# Patient Record
Sex: Female | Born: 1950 | Race: Asian | Hispanic: No | Marital: Married | Smoking: Never smoker
Health system: Southern US, Community
[De-identification: ages and names within clinical notes are randomized; demographics above are authoritative.]

## PROBLEM LIST (undated history)

## (undated) DIAGNOSIS — E785 Hyperlipidemia, unspecified: Secondary | ICD-10-CM

## (undated) DIAGNOSIS — E079 Disorder of thyroid, unspecified: Secondary | ICD-10-CM

## (undated) DIAGNOSIS — I10 Essential (primary) hypertension: Secondary | ICD-10-CM

## (undated) HISTORY — DX: Essential (primary) hypertension: I10

## (undated) HISTORY — PX: THYROID SURGERY: SHX805

## (undated) HISTORY — DX: Hyperlipidemia, unspecified: E78.5

## (undated) HISTORY — DX: Disorder of thyroid, unspecified: E07.9

---

## 2014-06-16 ENCOUNTER — Ambulatory Visit (INDEPENDENT_AMBULATORY_CARE_PROVIDER_SITE_OTHER): Payer: BLUE CROSS/BLUE SHIELD | Admitting: Family Medicine

## 2014-06-16 VITALS — BP 150/98 | HR 86 | Temp 97.9°F | Resp 16 | Ht <= 58 in | Wt 131.0 lb

## 2014-06-16 DIAGNOSIS — R05 Cough: Secondary | ICD-10-CM | POA: Diagnosis not present

## 2014-06-16 DIAGNOSIS — I10 Essential (primary) hypertension: Secondary | ICD-10-CM | POA: Diagnosis not present

## 2014-06-16 DIAGNOSIS — J302 Other seasonal allergic rhinitis: Secondary | ICD-10-CM | POA: Diagnosis not present

## 2014-06-16 DIAGNOSIS — R059 Cough, unspecified: Secondary | ICD-10-CM

## 2014-06-16 MED ORDER — CETIRIZINE HCL 10 MG PO TABS: 10.0000 mg | ORAL_TABLET | Freq: Every day | ORAL | Status: DC

## 2014-06-16 MED ORDER — HYDROCODONE-HOMATROPINE 5-1.5 MG/5ML PO SYRP: 5.0000 mL | ORAL_SOLUTION | Freq: Every evening | ORAL | Status: DC | PRN

## 2014-06-16 MED ORDER — BENZONATATE 100 MG PO CAPS: 200.0000 mg | ORAL_CAPSULE | Freq: Two times a day (BID) | ORAL | Status: DC | PRN

## 2014-06-16 MED ORDER — FLUTICASONE PROPIONATE 50 MCG/ACT NA SUSP: 2.0000 | Freq: Every day | NASAL | Status: DC

## 2014-06-16 NOTE — Progress Notes (Signed)
Chief Complaint:  Chief Complaint  Patient presents with  . Cough    x 3 weeks     HPI: Diana Gilmore is a 64 y.o. female who is here for  three-week history of intermittent dry cough. She started having this in conjunction with rhinorrhea and also watery eyes. She has had this before. She has worsening cough with cold air. She denies wheezing. She has not had any fevers or chills. She has been on a ACE inhibitor for the past 4 years for high blood pressure and denies any history of dry cough that has lasted this long in the past. He has tried over-the-counter Robitussin without any relief. Has not tried any allergy medications. Associated with scratchy throat throat and postnasal drip. Denies any recent travels, hemoptysis, weight loss, night sweats, fevers or chills. There is no history of tuberculosis.  Past Medical History  Diagnosis Date  . Thyroid disease   . Hypertension    Past Surgical History  Procedure Laterality Date  . Thyroid surgery     History   Social History  . Marital Status: Married    Spouse Name: N/A  . Number of Children: N/A  . Years of Education: N/A   Social History Main Topics  . Smoking status: Never Smoker   . Smokeless tobacco: Not on file  . Alcohol Use: Not on file  . Drug Use: Not on file  . Sexual Activity: Not on file   Other Topics Concern  . None   Social History Narrative  . None   History reviewed. No pertinent family history. No Known Allergies Prior to Admission medications   Medication Sig Start Date End Date Taking? Authorizing Provider  benazepril (LOTENSIN) 5 MG tablet Take 5 mg by mouth daily.   Yes Historical Provider, MD  levothyroxine (SYNTHROID, LEVOTHROID) 112 MCG tablet Take 112 mcg by mouth daily before breakfast.   Yes Historical Provider, MD     ROS: The patient denies fevers, chills, night sweats, unintentional weight loss, chest pain, palpitations, wheezing, dyspnea on exertion, nausea, vomiting, abdominal  pain, dysuria, hematuria, melena, numbness, weakness, or tingling.   All other systems have been reviewed and were otherwise negative with the exception of those mentioned in the HPI and as above.    PHYSICAL EXAM: Filed Vitals:   06/16/14 2005  BP: 150/98  Pulse: 86  Temp: 97.9 F (36.6 C)  Resp: 16   Filed Vitals:   06/16/14 2005  Height:  (1.346 m)  Weight: 131 lb (59.421 kg)   Body mass index is 32.8 kg/(m^2).  General: Alert, no acute distress HEENT:  Normocephalic, atraumatic, oropharynx patent. EOMI, PERRLA.  Erythematous throat, no exudates, TM normal, neg sinus tenderness, + erythematous/boggy nasal mucosa,  + PND Cardiovascular:  Regular rate and rhythm, no rubs murmurs or gallops.  Radial pulse intact. No pedal edema.  Respiratory: Clear to auscultation bilaterally.  No wheezes, rales, or rhonchi.  No cyanosis, no use of accessory musculature GI: No organomegaly, abdomen is soft and non-tender, positive bowel sounds.  No masses. Skin: No rashes. Neurologic: Facial musculature symmetric. Psychiatric: Patient is appropriate throughout our interaction. Lymphatic: No cervical lymphadenopathy Musculoskeletal: Gait intact.   LABS: No results found for this or any previous visit.   EKG/XRAY:   Primary read interpreted by Dr. Conley Rolls at [facility].   ASSESSMENT/PLAN: Encounter Diagnoses  Name Primary?  . Seasonal allergies Yes  . Cough   . Essential hypertension    Pleasant  64 year old obese female with past medical history of hypertension, hypothyroidism presents for 3 week history of intermittent dry cough that is consistent with seasonal allergy symptoms. Prescribed Zyrtec, Flonase, Tessalon Perles, Hycodan syrup I advised her that the this might be just seasonal allergy symptoms or could be related to her ACE inhibitor. We will try treatment of treating her symptoms first if she continues to persistent cough and she will need a chest x-ray.  She declines a chest  x-ray today. Follow-up with her PCP  Gross sideeffects, risk and benefits, and alternatives of medications d/w patient. Patient is aware that all medications have potential sideeffects and we are unable to predict every sideeffect or drug-drug interaction that may occur.  Hamilton CapriLE, Lajarvis Italiano PHUONG, DO 06/16/2014 9:14 PM

## 2014-07-22 ENCOUNTER — Ambulatory Visit (INDEPENDENT_AMBULATORY_CARE_PROVIDER_SITE_OTHER): Payer: BLUE CROSS/BLUE SHIELD

## 2014-07-22 ENCOUNTER — Ambulatory Visit (INDEPENDENT_AMBULATORY_CARE_PROVIDER_SITE_OTHER): Payer: BLUE CROSS/BLUE SHIELD | Admitting: Family Medicine

## 2014-07-22 VITALS — BP 138/92 | HR 95 | Temp 98.3°F | Resp 18 | Ht 60.0 in | Wt 131.0 lb

## 2014-07-22 DIAGNOSIS — M25562 Pain in left knee: Secondary | ICD-10-CM

## 2014-07-22 DIAGNOSIS — M25462 Effusion, left knee: Secondary | ICD-10-CM | POA: Diagnosis not present

## 2014-07-22 MED ORDER — MELOXICAM 7.5 MG PO TABS: 7.5000 mg | ORAL_TABLET | Freq: Every day | ORAL | Status: DC

## 2014-07-22 NOTE — Progress Notes (Addendum)
Subjective:    Patient ID: Diana Gilmore, female    DOB: May 27, 1950, 64 y.o.   MRN: [id number] This chart was scribed for Meredith Staggers, MD by Chestine Spore, ED Scribe. The patient was seen in room 7 at 12:03 PM.   Chief Complaint  Patient presents with  . Knee Pain    left, 2 weeks    HPI  Diana Gilmore is a 64 y.o. female with a medical hx of HTN and thyroid dx who presents today with no PCP on file complaining of left knee pain onset 5-6 weeks. Pt reports that she twisted the left knee 5-6 weeks ago and she felt the pain once she did that. Pt reports that 3-4 weeks ago she stepped off a curb when her knee slipped and gave way. Pt reports that she was having pain in the knee before she twisted the knee. Pt reports that her knee pain has been worsening since the fall. pt reports the pain began 5-6 weeks ago with pain and swelling and then pain is worsening. Pt is able to walk but she is not able to run or stand for long periods of time. Pt has not tried any medications for the relief of her symptoms. Pt notes that when her knee swells she will have pain with her left hip. Pt reports that her knee is sore when she is on it for more than one to two hours.  She states that she is having associated symptoms of left knee swelling, left hip pain. She states that she has tried Ice with no relief for her symptoms. She denies any other symptoms.      There are no active problems to display for this patient.  Past Medical History  Diagnosis Date  . Thyroid disease   . Hypertension    Past Surgical History  Procedure Laterality Date  . Thyroid surgery     No Known Allergies Prior to Admission medications   Medication Sig Start Date End Date Taking? Authorizing Provider  benazepril (LOTENSIN) 5 MG tablet Take 5 mg by mouth daily.    Historical Provider, MD  cetirizine (ZYRTEC) 10 MG tablet Take 1 tablet (10 mg total) by mouth daily. 06/16/14   Thao P Le, DO  levothyroxine (SYNTHROID, LEVOTHROID) 112  MCG tablet Take 112 mcg by mouth daily before breakfast.    Historical Provider, MD   History   Social History  . Marital Status: Married    Spouse Name: N/A  . Number of Children: N/A  . Years of Education: N/A   Occupational History  . Not on file.   Social History Main Topics  . Smoking status: Never Smoker   . Smokeless tobacco: Not on file  . Alcohol Use: Not on file  . Drug Use: Not on file  . Sexual Activity: Not on file   Other Topics Concern  . Not on file   Social History Narrative        Review of Systems     Objective:   Physical Exam  Musculoskeletal:       Left hip: Normal. She exhibits normal range of motion and no tenderness.       Left knee: She exhibits effusion. She exhibits normal range of motion and no erythema. Tenderness found. Medial joint line tenderness noted. No lateral joint line tenderness noted.  Left knee: Skin intact. Full ROM of left knee. No erythema. 1-2 + effusion of the left knee. Lateral joint nontender. Fibula head non-tender.  Medial joint line tender including medial tibial plateau. Slight discomfort with valgus, but no laxity. No laxity noted with varus testing. Mcmurray Negative. Negative lachmans but slight discomfort with testing but negative for laxity. Left hip Full ROM with no tenderness.           Filed Vitals:   07/22/14 1137  BP: 138/92  Pulse: 95  Temp: 98.3 F (36.8 C)  Resp: 18  Height: 5' (1.524 m)  Weight: 131 lb (59.421 kg)  SpO2: 97%   [facility] reading (PRIMARY) by  Dr. Neva Seat: L  Knee: medial degenerative changes, no acute findings.    Assessment & Plan:   Diana Gilmore is a 64 y.o. female Left medial knee pain - Plan: DG Knee Complete 4 Views Left, meloxicam (MOBIC) 7.5 MG tablet  Knee swelling, left - Plan: DG Knee Complete 4 Views Left  Possible degenerative meniscal tear vs. OA flare.  -trial of hinged knee brace, meloxicam once per day (cardiac risks discussed, shortest effective dose), and HEP  if tolerates by AVS.   -recheck in 2 weeks - if not improving - consider corticosteroid injection or ortho eval depending on sx's or if persistent mechanical sx's.   -rtc sooner if worse.   Meds ordered this encounter  Medications  . meloxicam (MOBIC) 7.5 MG tablet    Sig: Take 1 tablet (7.5 mg total) by mouth daily.    Dispense:  30 tablet    Refill:  0   Patient Instructions  You may have arthritis flare in your knee or tear of the meniscus.  Can try the meloxicam once per day (do not combine with other NSAIDS like ibuprofen or alleve), hinged knee brace as needed for now. watch blood pressure and if it elevates on the meloxicam, stop that medicine and return to discuss other options.    If pain tolerates - can try some of the exercises below. If pain not improving in next 2 weeks - return to discuss next step.   Return to the clinic or go to the nearest emergency room if any of your symptoms worsen or new symptoms occur.  Meniscus Tear with Phase I Rehab The meniscus is a C-shaped cartilage structure, located in the knee joint between the thigh bone (femur) and the shinbone (tibia). Two menisci are located in each knee joint: the inner and outer meniscus. The meniscus acts as an adapter between the thigh bone and shinbone, allowing them to fit properly together. It also functions as a shock absorber, to reduce the stress placed on the knee joint and to help supply nutrients to the knee joint cartilage. As people age, the meniscus begins to harden and become more vulnerable to injury. Meniscus tears are a common injury, especially in older athletes. Inner meniscus tears are more common than outer meniscus tears.  SYMPTOMS   Pain in the knee, especially with standing or squatting with the affected leg.  Tenderness along the joint line.  Swelling in the knee joint (effusion), usually starting 1 to 2 days after injury.  Locking or catching of the knee joint, causing inability to straighten  the knee completely.  Giving way or buckling of the knee. CAUSES  A meniscus tear occurs when a force is placed on the meniscus that is greater than it can handle. Common causes of injury include:  Direct hit (trauma) to the knee.  Twisting, pivoting, or cutting (rapidly changing direction while running), kneeling or squatting.  Without injury, due to aging. RISK INCREASES WITH:  Contact sports (football, rugby).  Sports in which cleats are used with pivoting (soccer, lacrosse) or sports in which good shoe grip and sudden change in direction are required (racquetball, basketball, squash).  Previous knee injury.  Associated knee injury, particularly ligament injuries.  Poor strength and flexibility. PREVENTION  Warm up and stretch properly before activity.  Maintain physical fitness:  Strength, flexibility, and endurance.  Cardiovascular fitness.  Protect the knee with a brace or elastic bandage.  Wear properly fitted protective equipment (proper cleats for the surface). PROGNOSIS  Sometimes, meniscus tears heal on their own. However, definitive treatment requires surgery, followed by at least 6 weeks of recovery.  RELATED COMPLICATIONS   Recurring symptoms that result in a chronic problem.  Repeated knee injury, especially if sports are resumed too soon after injury or surgery.  Progression of the tear (the tear gets larger), if untreated.  Arthritis of the knee in later years (with or without surgery).  Complications of surgery, including infection, bleeding, injury to nerves (numbness, weakness, paralysis) continued pain, giving way, locking, nonhealing of meniscus (if repaired), need for further surgery, and knee stiffness (loss of motion). TREATMENT  Treatment first involves the use of ice and medicine, to reduce pain and inflammation. You may find using crutches to walk more comfortable. However, it is okay to bear weight on the injured knee, if the pain will  allow it. Surgery is often advised as a definitive treatment. Surgery is performed through an incision near the joint (arthroscopically). The torn piece of the meniscus is removed, and if possible the joint cartilage is repaired. After surgery, the joint must be restrained. After restraint, it is important to perform strengthening and stretching exercises to help regain strength and a full range of motion. These exercises may be completed at home or with a therapist.  MEDICATION  If pain medicine is needed, nonsteroidal anti-inflammatory medicines (aspirin and ibuprofen), or other minor pain relievers (acetaminophen), are often advised.  Do not take pain medicine for 7 days before surgery.  Prescription pain relievers may be given, if your caregiver thinks they are needed. Use only as directed and only as much as you need. HEAT AND COLD  Cold treatment (icing) should be applied for 10 to 15 minutes every 2 to 3 hours for inflammation and pain, and immediately after activity that aggravates your symptoms. Use ice packs or an ice massage.  Heat treatment may be used before performing stretching and strengthening activities prescribed by your caregiver, physical therapist, or athletic trainer. Use a heat pack or a warm water soak. SEEK MEDICAL CARE IF:   Symptoms get worse or do not improve in 2 weeks, despite treatment.  New, unexplained symptoms develop. (Drugs used in treatment may produce side effects.) EXERCISES RANGE OF MOTION (ROM) AND STRETCHING EXERCISES - Meniscus Tear, Non-operative, Phase I These are some of the initial exercises with which you may start your rehabilitation program, until you see your caregiver again or until your symptoms are resolved. Remember:   These initial exercises are intended to be gentle. They will help you restore motion without increasing any swelling.  Completing these exercises allows less painful movement and prepares you for the more aggressive  strengthening exercises in Phase II.  An effective stretch should be held for at least 30 seconds.  A stretch should never be painful. You should only feel a gentle lengthening or release in the stretched tissue. RANGE OF MOTION - Knee Flexion, Active  Lie on your back with both  knees straight. (If this causes back discomfort, bend your healthy knee, placing your foot flat on the floor.)  Slowly slide your heel back toward your buttocks until you feel a gentle stretch in the front of your knee or thigh.  Hold for __________ seconds. Slowly slide your heel back to the starting position. Repeat __________ times. Complete this exercise __________ times per day.  RANGE OF MOTION - Knee Flexion and Extension, Active-Assisted  Sit on the edge of a table or chair with your thighs firmly supported. It may be helpful to place a folded towel under the end of your right / left thigh.  Flexion (bending): Place the ankle of your healthy leg on top of the other ankle. Use your healthy leg to gently bend your right / left knee until you feel a mild tension across the top of your knee.  Hold for __________ seconds.  Extension (straightening): Switch your ankles so your right / left leg is on top. Use your healthy leg to straighten your right / left knee until you feel a mild tension on the backside of your knee.  Hold for __________ seconds. Repeat __________ times. Complete __________ times per day. STRETCH - Knee Flexion, Supine  Lie on the floor with your right / left heel and foot lightly touching the wall. (Place both feet on the wall if you do not use a door frame.)  Without using any effort, allow gravity to slide your foot down the wall slowly until you feel a gentle stretch in the front of your right / left knee.  Hold this stretch for __________ seconds. Then return the leg to the starting position, using your healthy leg for help, if needed. Repeat __________ times. Complete this stretch  __________ times per day.  STRETCH - Knee Extension Sitting  Sit with your right / left leg/heel propped on another chair, coffee table, or foot stool.  Allow your leg muscles to relax, letting gravity straighten out your knee.*  You should feel a stretch behind your right / left knee. Hold this position for __________ seconds. Repeat __________ times. Complete this stretch __________ times per day.  *Your physician, physical therapist or athletic trainer may instruct you place a __________ weight on your thigh, just above your kneecap, to deepen the stretch.  STRENGTHENING EXERCISES - Meniscus Tear, Non-operative, Phase I These exercises may help you when beginning to rehabilitate your injury. They may resolve your symptoms with or without further involvement from your physician, physical therapist or athletic trainer. While completing these exercises, remember:   Muscles can gain both the endurance and the strength needed for everyday activities through controlled exercises.  Complete these exercises as instructed by your physician, physical therapist or athletic trainer. Progress the resistance and repetitions only as guided. STRENGTH - Quadriceps, Isometrics  Lie on your back with your right / left leg extended and your opposite knee bent.  Gradually tense the muscles in the front of your right / left thigh. You should see either your knee cap slide up toward your hip or increased dimpling just above the knee. This motion will push the back of the knee down toward the floor, mat, or bed on which you are lying.  Hold the muscle as tight as you can, without increasing your pain, for __________ seconds.  Relax the muscles slowly and completely between each repetition. Repeat __________ times. Complete this exercise __________ times per day.  STRENGTH - Quadriceps, Short Arcs   Lie on your back.  Place a __________ inch towel roll under your right / left knee, so that the knee bends  slightly.  Raise only your lower leg by tightening the muscles in the front of your thigh. Do not allow your thigh to rise.  Hold this position for __________ seconds. Repeat __________ times. Complete this exercise __________ times per day.  OPTIONAL ANKLE WEIGHTS: Begin with ____________________, but DO NOT exceed ____________________. Increase in 1 pound/0.5 kilogram increments. STRENGTH - Quadriceps, Straight Leg Raises  Quality counts! Watch for signs that the quadriceps muscle is working, to be sure you are strengthening the correct muscles and not "cheating" by substituting with healthier muscles.  Lay on your back with your right / left leg extended and your opposite knee bent.  Tense the muscles in the front of your right / left thigh. You should see either your knee cap slide up or increased dimpling just above the knee. Your thigh may even shake a bit.  Tighten these muscles even more and raise your leg 4 to 6 inches off the floor. Hold for __________ seconds.  Keeping these muscles tense, lower your leg.  Relax the muscles slowly and completely in between each repetition. Repeat __________ times. Complete this exercise __________ times per day.  STRENGTH - Hamstring, Curls   Lay on your stomach with your legs extended. (If you lay on a bed, your feet may hang over the edge.)  Tighten the muscles in the back of your thigh to bend your right / left knee up to 90 degrees. Keep your hips flat on the bed.  Hold this position for __________ seconds.  Slowly lower your leg back to the starting position. Repeat __________ times. Complete this exercise __________ times per day.  STRENGTH - Quadriceps, Squats  Stand in a door frame so that your feet and knees are in line with the frame.  Use your hands for balance, not support, on the frame.  Slowly lower your weight, bending at the hips and knees. Keep your lower legs upright so that they are parallel with the door frame. Squat  only within the range that does not increase your knee pain. Never let your hips drop below your knees.  Slowly return upright, pushing with your legs, not pulling with your hands. Repeat __________ times. Complete this exercise __________ times per day.  STRENGTH - Quad/VMO, Isometric   Sit in a chair with your right / left knee slightly bent. With your fingertips, feel the VMO muscle just above the inside of your knee. The VMO is important in controlling the position of your kneecap.  Keeping your fingertips on this muscle. Without actually moving your leg, attempt to drive your knee down as if straightening your leg. You should feel your VMO tense. If you have a difficult time, you may wish to try the same exercise on your healthy knee first.  Tense this muscle as hard as you can without increasing any knee pain.  Hold for __________ seconds. Relax the muscles slowly and completely in between each repetition. Repeat __________ times. Complete exercise __________ times per day.  Document Released: 04/01/1998 Document Revised: 06/10/2011 Document Reviewed: 06/30/2008 [facility] Patient Information 2015 [employer], [place]. This information is not intended to replace advice given to you by your health care provider. Make sure you discuss any questions you have with your health care provider.      I personally performed the services described in this documentation, which was scribed in my presence. The recorded information has  been reviewed and considered, and addended by me as needed.

## 2014-07-22 NOTE — Patient Instructions (Signed)
You may have arthritis flare in your knee or tear of the meniscus.  Can try the meloxicam once per day (do not combine with other NSAIDS like ibuprofen or alleve), hinged knee brace as needed for now. watch blood pressure and if it elevates on the meloxicam, stop that medicine and return to discuss other options.    If pain tolerates - can try some of the exercises below. If pain not improving in next 2 weeks - return to discuss next step.   Return to the clinic or go to the nearest emergency room if any of your symptoms worsen or new symptoms occur.  Meniscus Tear with Phase I Rehab The meniscus is a C-shaped cartilage structure, located in the knee joint between the thigh bone (femur) and the shinbone (tibia). Two menisci are located in each knee joint: the inner and outer meniscus. The meniscus acts as an adapter between the thigh bone and shinbone, allowing them to fit properly together. It also functions as a shock absorber, to reduce the stress placed on the knee joint and to help supply nutrients to the knee joint cartilage. As people age, the meniscus begins to harden and become more vulnerable to injury. Meniscus tears are a common injury, especially in older athletes. Inner meniscus tears are more common than outer meniscus tears.  SYMPTOMS   Pain in the knee, especially with standing or squatting with the affected leg.  Tenderness along the joint line.  Swelling in the knee joint (effusion), usually starting 1 to 2 days after injury.  Locking or catching of the knee joint, causing inability to straighten the knee completely.  Giving way or buckling of the knee. CAUSES  A meniscus tear occurs when a force is placed on the meniscus that is greater than it can handle. Common causes of injury include:  Direct hit (trauma) to the knee.  Twisting, pivoting, or cutting (rapidly changing direction while running), kneeling or squatting.  Without injury, due to aging. RISK INCREASES  WITH:  Contact sports (football, rugby).  Sports in which cleats are used with pivoting (soccer, lacrosse) or sports in which good shoe grip and sudden change in direction are required (racquetball, basketball, squash).  Previous knee injury.  Associated knee injury, particularly ligament injuries.  Poor strength and flexibility. PREVENTION  Warm up and stretch properly before activity.  Maintain physical fitness:  Strength, flexibility, and endurance.  Cardiovascular fitness.  Protect the knee with a brace or elastic bandage.  Wear properly fitted protective equipment (proper cleats for the surface). PROGNOSIS  Sometimes, meniscus tears heal on their own. However, definitive treatment requires surgery, followed by at least 6 weeks of recovery.  RELATED COMPLICATIONS   Recurring symptoms that result in a chronic problem.  Repeated knee injury, especially if sports are resumed too soon after injury or surgery.  Progression of the tear (the tear gets larger), if untreated.  Arthritis of the knee in later years (with or without surgery).  Complications of surgery, including infection, bleeding, injury to nerves (numbness, weakness, paralysis) continued pain, giving way, locking, nonhealing of meniscus (if repaired), need for further surgery, and knee stiffness (loss of motion). TREATMENT  Treatment first involves the use of ice and medicine, to reduce pain and inflammation. You may find using crutches to walk more comfortable. However, it is okay to bear weight on the injured knee, if the pain will allow it. Surgery is often advised as a definitive treatment. Surgery is performed through an incision near the joint (  arthroscopically). The torn piece of the meniscus is removed, and if possible the joint cartilage is repaired. After surgery, the joint must be restrained. After restraint, it is important to perform strengthening and stretching exercises to help regain strength and a  full range of motion. These exercises may be completed at home or with a therapist.  MEDICATION  If pain medicine is needed, nonsteroidal anti-inflammatory medicines (aspirin and ibuprofen), or other minor pain relievers (acetaminophen), are often advised.  Do not take pain medicine for 7 days before surgery.  Prescription pain relievers may be given, if your caregiver thinks they are needed. Use only as directed and only as much as you need. HEAT AND COLD  Cold treatment (icing) should be applied for 10 to 15 minutes every 2 to 3 hours for inflammation and pain, and immediately after activity that aggravates your symptoms. Use ice packs or an ice massage.  Heat treatment may be used before performing stretching and strengthening activities prescribed by your caregiver, physical therapist, or athletic trainer. Use a heat pack or a warm water soak. SEEK MEDICAL CARE IF:   Symptoms get worse or do not improve in 2 weeks, despite treatment.  New, unexplained symptoms develop. (Drugs used in treatment may produce side effects.) EXERCISES RANGE OF MOTION (ROM) AND STRETCHING EXERCISES - Meniscus Tear, Non-operative, Phase I These are some of the initial exercises with which you may start your rehabilitation program, until you see your caregiver again or until your symptoms are resolved. Remember:   These initial exercises are intended to be gentle. They will help you restore motion without increasing any swelling.  Completing these exercises allows less painful movement and prepares you for the more aggressive strengthening exercises in Phase II.  An effective stretch should be held for at least 30 seconds.  A stretch should never be painful. You should only feel a gentle lengthening or release in the stretched tissue. RANGE OF MOTION - Knee Flexion, Active  Lie on your back with both knees straight. (If this causes back discomfort, bend your healthy knee, placing your foot flat on the  floor.)  Slowly slide your heel back toward your buttocks until you feel a gentle stretch in the front of your knee or thigh.  Hold for __________ seconds. Slowly slide your heel back to the starting position. Repeat __________ times. Complete this exercise __________ times per day.  RANGE OF MOTION - Knee Flexion and Extension, Active-Assisted  Sit on the edge of a table or chair with your thighs firmly supported. It may be helpful to place a folded towel under the end of your right / left thigh.  Flexion (bending): Place the ankle of your healthy leg on top of the other ankle. Use your healthy leg to gently bend your right / left knee until you feel a mild tension across the top of your knee.  Hold for __________ seconds.  Extension (straightening): Switch your ankles so your right / left leg is on top. Use your healthy leg to straighten your right / left knee until you feel a mild tension on the backside of your knee.  Hold for __________ seconds. Repeat __________ times. Complete __________ times per day. STRETCH - Knee Flexion, Supine  Lie on the floor with your right / left heel and foot lightly touching the wall. (Place both feet on the wall if you do not use a door frame.)  Without using any effort, allow gravity to slide your foot down the wall slowly  until you feel a gentle stretch in the front of your right / left knee.  Hold this stretch for __________ seconds. Then return the leg to the starting position, using your healthy leg for help, if needed. Repeat __________ times. Complete this stretch __________ times per day.  STRETCH - Knee Extension Sitting  Sit with your right / left leg/heel propped on another chair, coffee table, or foot stool.  Allow your leg muscles to relax, letting gravity straighten out your knee.*  You should feel a stretch behind your right / left knee. Hold this position for __________ seconds. Repeat __________ times. Complete this stretch  __________ times per day.  *Your physician, physical therapist or athletic trainer may instruct you place a __________ weight on your thigh, just above your kneecap, to deepen the stretch.  STRENGTHENING EXERCISES - Meniscus Tear, Non-operative, Phase I These exercises may help you when beginning to rehabilitate your injury. They may resolve your symptoms with or without further involvement from your physician, physical therapist or athletic trainer. While completing these exercises, remember:   Muscles can gain both the endurance and the strength needed for everyday activities through controlled exercises.  Complete these exercises as instructed by your physician, physical therapist or athletic trainer. Progress the resistance and repetitions only as guided. STRENGTH - Quadriceps, Isometrics  Lie on your back with your right / left leg extended and your opposite knee bent.  Gradually tense the muscles in the front of your right / left thigh. You should see either your knee cap slide up toward your hip or increased dimpling just above the knee. This motion will push the back of the knee down toward the floor, mat, or bed on which you are lying.  Hold the muscle as tight as you can, without increasing your pain, for __________ seconds.  Relax the muscles slowly and completely between each repetition. Repeat __________ times. Complete this exercise __________ times per day.  STRENGTH - Quadriceps, Short Arcs   Lie on your back. Place a __________ inch towel roll under your right / left knee, so that the knee bends slightly.  Raise only your lower leg by tightening the muscles in the front of your thigh. Do not allow your thigh to rise.  Hold this position for __________ seconds. Repeat __________ times. Complete this exercise __________ times per day.  OPTIONAL ANKLE WEIGHTS: Begin with ____________________, but DO NOT exceed ____________________. Increase in 1 pound/0.5 kilogram  increments. STRENGTH - Quadriceps, Straight Leg Raises  Quality counts! Watch for signs that the quadriceps muscle is working, to be sure you are strengthening the correct muscles and not "cheating" by substituting with healthier muscles.  Lay on your back with your right / left leg extended and your opposite knee bent.  Tense the muscles in the front of your right / left thigh. You should see either your knee cap slide up or increased dimpling just above the knee. Your thigh may even shake a bit.  Tighten these muscles even more and raise your leg 4 to 6 inches off the floor. Hold for __________ seconds.  Keeping these muscles tense, lower your leg.  Relax the muscles slowly and completely in between each repetition. Repeat __________ times. Complete this exercise __________ times per day.  STRENGTH - Hamstring, Curls   Lay on your stomach with your legs extended. (If you lay on a bed, your feet may hang over the edge.)  Tighten the muscles in the back of your thigh to bend  your right / left knee up to 90 degrees. Keep your hips flat on the bed.  Hold this position for __________ seconds.  Slowly lower your leg back to the starting position. Repeat __________ times. Complete this exercise __________ times per day.  STRENGTH - Quadriceps, Squats  Stand in a door frame so that your feet and knees are in line with the frame.  Use your hands for balance, not support, on the frame.  Slowly lower your weight, bending at the hips and knees. Keep your lower legs upright so that they are parallel with the door frame. Squat only within the range that does not increase your knee pain. Never let your hips drop below your knees.  Slowly return upright, pushing with your legs, not pulling with your hands. Repeat __________ times. Complete this exercise __________ times per day.  STRENGTH - Quad/VMO, Isometric   Sit in a chair with your right / left knee slightly bent. With your fingertips,  feel the VMO muscle just above the inside of your knee. The VMO is important in controlling the position of your kneecap.  Keeping your fingertips on this muscle. Without actually moving your leg, attempt to drive your knee down as if straightening your leg. You should feel your VMO tense. If you have a difficult time, you may wish to try the same exercise on your healthy knee first.  Tense this muscle as hard as you can without increasing any knee pain.  Hold for __________ seconds. Relax the muscles slowly and completely in between each repetition. Repeat __________ times. Complete exercise __________ times per day.  Document Released: 04/01/1998 Document Revised: 06/10/2011 Document Reviewed: 06/30/2008 [facility] Patient Information 2015 [place], [place]. This information is not intended to replace advice given to you by your health care provider. Make sure you discuss any questions you have with your health care provider.

## 2014-09-05 ENCOUNTER — Ambulatory Visit (INDEPENDENT_AMBULATORY_CARE_PROVIDER_SITE_OTHER): Payer: BLUE CROSS/BLUE SHIELD | Admitting: Family Medicine

## 2014-09-05 VITALS — BP 124/90 | HR 84 | Temp 98.5°F | Resp 18 | Ht 61.0 in | Wt 132.0 lb

## 2014-09-05 DIAGNOSIS — R059 Cough, unspecified: Secondary | ICD-10-CM

## 2014-09-05 DIAGNOSIS — E039 Hypothyroidism, unspecified: Secondary | ICD-10-CM

## 2014-09-05 DIAGNOSIS — R05 Cough: Secondary | ICD-10-CM

## 2014-09-05 DIAGNOSIS — I10 Essential (primary) hypertension: Secondary | ICD-10-CM

## 2014-09-05 LAB — COMPLETE METABOLIC PANEL WITH GFR
ALT: 19 U/L (ref 0–35)
AST: 17 U/L (ref 0–37)
Albumin: 4.7 g/dL (ref 3.5–5.2)
Alkaline Phosphatase: 56 U/L (ref 39–117)
BUN: 12 mg/dL (ref 6–23)
CO2: 27 mEq/L (ref 19–32)
CREATININE: 0.64 mg/dL (ref 0.50–1.10)
Calcium: 9.9 mg/dL (ref 8.4–10.5)
Chloride: 102 mEq/L (ref 96–112)
GFR, Est African American: >89 mL/min
GFR, Est Non African American: >89 mL/min
Glucose, Bld: 127 mg/dL — ABNORMAL HIGH (ref 70–99)
Potassium: 4.3 mEq/L (ref 3.5–5.3)
Sodium: 138 mEq/L (ref 135–145)
Total Bilirubin: 0.4 mg/dL (ref 0.2–1.2)
Total Protein: 7.9 g/dL (ref 6.0–8.3)

## 2014-09-05 LAB — LIPID PANEL
CHOL/HDL RATIO: 7 ratio
Cholesterol: 266 mg/dL — ABNORMAL HIGH (ref 0–200)
HDL: 38 mg/dL — AB (ref >=46)
Triglycerides: 426 mg/dL — ABNORMAL HIGH (ref <150)

## 2014-09-05 LAB — TSH: TSH: 2.301 u[IU]/mL (ref 0.350–4.500)

## 2014-09-05 MED ORDER — BENAZEPRIL HCL 5 MG PO TABS: 5.0000 mg | ORAL_TABLET | Freq: Every day | ORAL | Status: DC

## 2014-09-05 MED ORDER — LEVOTHYROXINE SODIUM 112 MCG PO TABS: 112.0000 ug | ORAL_TABLET | Freq: Every day | ORAL | Status: DC

## 2014-09-05 NOTE — Progress Notes (Signed)
Subjective:  This chart was scribed for Diana StaggersJeffrey Kiele Heavrin, MD by [facility], medical scribe [facility].The patient was seen in exam room 12 and the patient's care was started at 11:25 AM.   Patient ID: Diana Gilmore, female    DOB: 12/20/1950, 64 y.o.   MRN: [id number] Chief Complaint  Patient presents with  . Medication Refill    benazepril, levothyroxine   HPI HPI Comments: Diana Gilmore is a 64 y.o. female who presents to [facility] for a medication refill. She [place] not [place], [place] she [place] want her PCP here. Her last physical was one year ago. She had milk 4-5 hours ago other than that no other foods.  Hypertension: He takes Lotensin 5 mg daily. Last dose was last night. No side effects from her Lotensin.  No results found for: CREATININE  BP Readings from Last 3 Encounters:  09/05/14 124/90  07/22/14 138/92  06/16/14 150/98   Hypothyroidism: He takes synthroid 112 micrograms daily, no recent TSH on file. No missed doses. No recent hot/cold spells or weight changes.  Wt Readings from Last 3 Encounters:  09/05/14 132 lb (59.875 kg)  07/22/14 131 lb (59.421 kg)  06/16/14 131 lb (59.421 kg)   There are no active problems to display for this patient.  Past Medical History  Diagnosis Date  . Thyroid disease   . Hypertension    Past Surgical History  Procedure Laterality Date  . Thyroid surgery     No Known Allergies Prior to Admission medications   Medication Sig Start Date End Date Taking? Authorizing Provider  benazepril (LOTENSIN) 5 MG tablet Take 5 mg by mouth daily.   Yes Historical Provider, MD  levothyroxine (SYNTHROID, LEVOTHROID) 112 MCG tablet Take 112 mcg by mouth daily before breakfast.   Yes Historical Provider, MD  cetirizine (ZYRTEC) 10 MG tablet Take 1 tablet (10 mg total) by mouth daily. Patient not taking: Reported on 09/05/2014 06/16/14   Thao P Le, DO  meloxicam (MOBIC) 7.5 MG tablet Take 1  tablet (7.5 mg total) by mouth daily. Patient not taking: Reported on 09/05/2014 07/22/14   Shade FloodJeffrey R Beverely Suen, MD   History   Social History  . Marital Status: Married    Spouse Name: N/A  . Number of Children: N/A  . Years of Education: N/A   Occupational History  . Not on file.   Social History Main Topics  . Smoking status: Never Smoker   . Smokeless tobacco: Not on file  . Alcohol Use: Not on file  . Drug Use: Not on file  . Sexual Activity: Not on file   Other Topics Concern  . Not on file   Social History Narrative   Review of Systems  Constitutional: Negative for fatigue and unexpected weight change.  Respiratory: Negative for chest tightness and shortness of breath.   Cardiovascular: Negative for chest pain, palpitations and leg swelling.  Gastrointestinal: Negative for abdominal pain and blood in stool.  Neurological: Negative for dizziness, syncope, light-headedness and headaches.      Objective:  BP 124/90 mmHg  Pulse 84  Temp(Src) 98.5 F (36.9 C) (Oral)  Resp 18  Ht 5\' 1"  (1.549 m)  Wt 132 lb (59.875 kg)  BMI 24.95 kg/m2  SpO2 97% Physical Exam  Constitutional: She is oriented to person, place, and time. She appears well-developed and well-nourished. No distress.  HENT:  Head: Normocephalic and atraumatic.  Eyes: Conjunctivae  and EOM are normal. Pupils are equal, round, and reactive to light.  Neck: Normal range of motion. Carotid bruit is not present.  Cardiovascular: Normal rate, regular rhythm, normal heart sounds and intact distal pulses.   Pulmonary/Chest: Effort normal and breath sounds normal. No respiratory distress.  Abdominal: Soft. She exhibits no pulsatile midline mass. There is no tenderness.  Musculoskeletal: Normal range of motion.  Neurological: She is alert and oriented to person, place, and time.  Skin: Skin is warm and dry.  Psychiatric: She has a normal mood and affect. Her behavior is normal.  Nursing note and vitals reviewed.      Assessment & Plan:   Chantavia Bazzle is a 64 y.o. female Essential hypertension - Plan: COMPLETE METABOLIC PANEL WITH GFR, Lipid panel, benazepril (LOTENSIN) 5 MG tablet  - stable, check lipids, CMP, refilled benazepril at same dose for 6 months.   Hypothyroidism, unspecified hypothyroidism type - Plan: TSH, levothyroxine (SYNTHROID, LEVOTHROID) 112 MCG tablet  - tsh pending. Refilled synthroid at same dose for now.   Cough  -asx now.  Suspect allergies as episodic sx's, doubt ACE-I cough, but if recurrent- consider trial off ACE.   Plan on CPE with myself or other provider here in next 6 months as plans on [facility] as PCP now.   Meds ordered this encounter  Medications  . levothyroxine (SYNTHROID, LEVOTHROID) 112 MCG tablet    Sig: Take 1 tablet (112 mcg total) by mouth daily before breakfast.    Dispense:  90 tablet    Refill:  1  . benazepril (LOTENSIN) 5 MG tablet    Sig: Take 1 tablet (5 mg total) by mouth daily.    Dispense:  90 tablet    Refill:  1   Patient Instructions  You should receive a call or letter about your lab results within the next week to 10 days.  Schedule physical with myself or other provider accepting new patients in next 6 months.  If cough returns - return for recheck, but I think it is due to allergies, not your blood pressure medicine at this point.  Return to the clinic or go to the nearest emergency room if any of your symptoms worsen or new symptoms occur.      I personally performed the services described in this documentation, which was scribed in my presence. The recorded information has been reviewed and considered, and addended by me as needed.

## 2014-09-05 NOTE — Patient Instructions (Addendum)
You should receive a call or letter about your lab results within the next week to 10 days.  Schedule physical with myself or other provider accepting new patients in next 6 months.  If cough returns - return for recheck, but I think it is due to allergies, not your blood pressure medicine at this point.  Return to the clinic or go to the nearest emergency room if any of your symptoms worsen or new symptoms occur.

## 2014-09-19 ENCOUNTER — Encounter: Payer: Self-pay | Admitting: Family Medicine

## 2014-12-02 ENCOUNTER — Telehealth: Payer: Self-pay

## 2014-12-02 NOTE — Telephone Encounter (Signed)
Spoke with pt, advised she has a refill on these Rx's so she can call her pharmacy to get the refill. Pt understood.

## 2014-12-02 NOTE — Telephone Encounter (Signed)
Pt states she has an appointment in October with Dr Clelia Croft to establish as PCP, but has previously seen Dr Neva Seat. Pt states she will be out of her medication for Benazepril and Synthroid, before that appointment Patient wants to know if rx's can be refilled until she gets in for appointment in October

## 2015-01-19 ENCOUNTER — Ambulatory Visit (INDEPENDENT_AMBULATORY_CARE_PROVIDER_SITE_OTHER): Payer: BLUE CROSS/BLUE SHIELD | Admitting: Family Medicine

## 2015-01-19 ENCOUNTER — Encounter: Payer: Self-pay | Admitting: Family Medicine

## 2015-01-19 VITALS — BP 136/82 | HR 102 | Temp 99.0°F | Resp 16 | Ht 59.75 in | Wt 127.8 lb

## 2015-01-19 DIAGNOSIS — E785 Hyperlipidemia, unspecified: Secondary | ICD-10-CM

## 2015-01-19 DIAGNOSIS — Z130 Encounter for screening for diseases of the blood and blood-forming organs and certain disorders involving the immune mechanism: Secondary | ICD-10-CM | POA: Diagnosis not present

## 2015-01-19 DIAGNOSIS — Z136 Encounter for screening for cardiovascular disorders: Secondary | ICD-10-CM | POA: Diagnosis not present

## 2015-01-19 DIAGNOSIS — Z0000 Encounter for general adult medical examination without abnormal findings: Secondary | ICD-10-CM | POA: Diagnosis not present

## 2015-01-19 DIAGNOSIS — Z113 Encounter for screening for infections with a predominantly sexual mode of transmission: Secondary | ICD-10-CM | POA: Diagnosis not present

## 2015-01-19 DIAGNOSIS — I10 Essential (primary) hypertension: Secondary | ICD-10-CM | POA: Diagnosis not present

## 2015-01-19 DIAGNOSIS — B001 Herpesviral vesicular dermatitis: Secondary | ICD-10-CM | POA: Diagnosis not present

## 2015-01-19 DIAGNOSIS — Z1212 Encounter for screening for malignant neoplasm of rectum: Secondary | ICD-10-CM | POA: Diagnosis not present

## 2015-01-19 DIAGNOSIS — E039 Hypothyroidism, unspecified: Secondary | ICD-10-CM

## 2015-01-19 DIAGNOSIS — Z1389 Encounter for screening for other disorder: Secondary | ICD-10-CM | POA: Diagnosis not present

## 2015-01-19 DIAGNOSIS — Z1383 Encounter for screening for respiratory disorder NEC: Secondary | ICD-10-CM

## 2015-01-19 DIAGNOSIS — Z1211 Encounter for screening for malignant neoplasm of colon: Secondary | ICD-10-CM | POA: Diagnosis not present

## 2015-01-19 DIAGNOSIS — Z1239 Encounter for other screening for malignant neoplasm of breast: Secondary | ICD-10-CM

## 2015-01-19 MED ORDER — LEVOTHYROXINE SODIUM 112 MCG PO TABS: 112.0000 ug | ORAL_TABLET | Freq: Every day | ORAL | Status: DC

## 2015-01-19 MED ORDER — HYDROCORTISONE-ACETIC ACID 1-2 % OT SOLN: 3.0000 [drp] | Freq: Three times a day (TID) | OTIC | Status: DC | PRN

## 2015-01-19 MED ORDER — VALACYCLOVIR HCL 1 G PO TABS: ORAL_TABLET | ORAL | Status: DC

## 2015-01-19 NOTE — Progress Notes (Signed)
Subjective:    Patient ID: Diana Gilmore, female    DOB: 07/17/1950, 64 y.o.   MRN: [id number] Chief Complaint  Patient presents with  . Annual Exam    HPI  Is going to exercise more often to get her BP down and will cont to monitor her monitor. Is taking her bp PILL WITH thyroid but very consistent with ehr thyroid. Does check bp outside of office 1204-140s/  Last menses about 10 yrs   4 kids  Centrum mvi.tdap 7 yrs ago biotib  Past Medical History  Diagnosis Date  . Thyroid disease   . Hypertension    Past Surgical History  Procedure Laterality Date  . Thyroid surgery     Current Outpatient Prescriptions on File Prior to Visit  Medication Sig Dispense Refill  . benazepril (LOTENSIN) 5 MG tablet Take 1 tablet (5 mg total) by mouth daily. 90 tablet 1   No current facility-administered medications on file prior to visit.   No Known Allergies No family history on file. Social History   Social History  . Marital Status: Married    Spouse Name: N/A  . Number of Children: N/A  . Years of Education: N/A   Social History Main Topics  . Smoking status: Never Smoker   . Smokeless tobacco: None  . Alcohol Use: None  . Drug Use: None  . Sexual Activity: Not Asked   Other Topics Concern  . None   Social History Narrative    Review of Systems  Genitourinary: Negative for vaginal bleeding and menstrual problem.  All other systems reviewed and are negative.      Objective:  BP 136/82 mmHg  Pulse 102  Temp(Src) 99 F (37.2 C) (Oral)  Resp 16  Ht 4' 11.75" (1.518 m)  Wt 127 lb 12.8 oz (57.97 kg)  BMI 25.16 kg/m2  SpO2 97%  Physical Exam  Constitutional: She is oriented to person, place, and time. She appears well-developed and well-nourished. No distress.  HENT:  Head: Normocephalic and atraumatic.  Right Ear: Tympanic membrane, external ear and ear canal normal.  Left Ear: Tympanic membrane, external ear and ear canal normal.  Nose: Nose normal. No mucosal  edema or rhinorrhea.  Mouth/Throat: Uvula is midline, oropharynx is clear and moist and mucous membranes are normal. No posterior oropharyngeal erythema.  Eyes: Conjunctivae and EOM are normal. Pupils are equal, round, and reactive to light. Right eye exhibits no discharge. Left eye exhibits no discharge. No scleral icterus.  Neck: Normal range of motion. Neck supple. No thyromegaly present.  Cardiovascular: Normal rate, regular rhythm, normal heart sounds and intact distal pulses.   Pulmonary/Chest: Effort normal and breath sounds normal. No respiratory distress.  Abdominal: Soft. Bowel sounds are normal. There is no tenderness.  Musculoskeletal: She exhibits no edema.  Lymphadenopathy:    She has no cervical adenopathy.  Neurological: She is alert and oriented to person, place, and time. She has normal reflexes.  Skin: Skin is warm and dry. She is not diaphoretic. No erythema.  Psychiatric: She has a normal mood and affect. Her behavior is normal.   [facility] reading (PRIMARY) by  Dr. Clelia CroftShaw. EKG: NSR, no ischemic changes     Assessment & Plan:  Hiv, hep C tdap done 2009 after er visit from [facility] - freq gets cut Shingles - declines, unsure if she has had chicken pox when younger occ a Valacylovir, vosol ac  1. Annual physical exam   2. Essential hypertension   3. Hyperlipidemia  4. Screening for breast cancer   5. Routine screening for STI (sexually transmitted infection)   6. Screening for cardiovascular, respiratory, and genitourinary diseases   7. Screening for colorectal cancer   8. Screening for deficiency anemia   9. Recurrent herpes labialis   10. Hypothyroidism, unspecified hypothyroidism type   11.    Hair dry/thinning - unlikely to be from levothyroxine doe as pt suspects  Orders Placed This Encounter  Procedures  . EKG 12-Lead    Meds ordered this encounter  Medications  . valACYclovir (VALTREX) 1000 MG tablet    Sig: At the first sign of a cold sore, take 2  tabs and then take another 2 tabs 12 hours later.    Dispense:  20 tablet    Refill:  2  . levothyroxine (SYNTHROID, LEVOTHROID) 112 MCG tablet    Sig: Take 1 tablet (112 mcg total) by mouth daily before breakfast.    Dispense:  90 tablet    Refill:  1    Needs BRAND NAME ONLY    Norberto Sorenson, MD MPH

## 2015-01-19 NOTE — Patient Instructions (Signed)
Start BIOTIN vitamins in addition to your multivitamin.  Come back in 4 to 6 months to get you labs done - make sure you are fasting (no food - only water or black coffee or tea since midnight prior) and schedule an appointment for later on in the week so we can review all of your labs and recheck your blood pressure   Exercising to Stay Healthy Exercising regularly is important. It has many health benefits, such as:  Improving your overall fitness, flexibility, and endurance.  Increasing your bone density.  Helping with weight control.  Decreasing your body fat.  Increasing your muscle strength.  Reducing stress and tension.  Improving your overall health. In order to become healthy and stay healthy, it is recommended that you do moderate-intensity and vigorous-intensity exercise. You can tell that you are exercising at a moderate intensity if you have a higher heart rate and faster breathing, but you are still able to hold a conversation. You can tell that you are exercising at a vigorous intensity if you are breathing much harder and faster and cannot hold a conversation while exercising. HOW OFTEN SHOULD I EXERCISE? Choose an activity that you enjoy and set realistic goals. Your health care provider can help you to make an activity plan that works for you. Exercise regularly as directed by your health care provider. This may include:   Doing resistance training twice each week, such as:  Push-ups.  Sit-ups.  Lifting weights.  Using resistance bands.  Doing a given intensity of exercise for a given amount of time. Choose from these options:  150 minutes of moderate-intensity exercise every week.  75 minutes of vigorous-intensity exercise every week.  A mix of moderate-intensity and vigorous-intensity exercise every week. Children, pregnant women, people who are out of shape, people who are overweight, and older adults may need to consult a health care provider for  individual recommendations. If you have any sort of medical condition, be sure to consult your health care provider before starting a new exercise program.  WHAT ARE SOME EXERCISE IDEAS? Some moderate-intensity exercise ideas include:   Walking at a rate of 1 mile in 15 minutes.  Biking.  Hiking.  Golfing.  Dancing. Some vigorous-intensity exercise ideas include:   Walking at a rate of at least 4.5 miles per hour.  Jogging or running at a rate of 5 miles per hour.  Biking at a rate of at least 10 miles per hour.  Lap swimming.  Roller-skating or in-line skating.  Cross-country skiing.  Vigorous competitive sports, such as football, basketball, and soccer.  Jumping rope.  Aerobic dancing. WHAT ARE SOME EVERYDAY ACTIVITIES THAT CAN HELP ME TO GET EXERCISE?  Yard work, such as:  Psychologist, educational.  Raking and bagging leaves.  Washing and waxing your car.  Pushing a stroller.  Shoveling snow.  Gardening.  Washing windows or floors. HOW CAN I BE MORE ACTIVE IN MY DAY-TO-DAY ACTIVITIES?  Use the stairs instead of the elevator.  Take a walk during your lunch break.  If you drive, park your car farther away from work or school.  If you take public transportation, get off one stop early and walk the rest of the way.  Make all of your phone calls while standing up and walking around.  Get up, stretch, and walk around every 30 minutes throughout the day. WHAT GUIDELINES SHOULD I FOLLOW WHILE EXERCISING?  Do not exercise so much that you hurt yourself, feel dizzy, or get  very short of breath.  Consult your health care provider before starting a new exercise program.  Wear comfortable clothes and shoes with good support.  Drink plenty of water while you exercise to prevent dehydration or heat stroke. Body water is lost during exercise and must be replaced.  Work out until you breathe faster and your heart beats faster.   This information is not intended  to replace advice given to you by your health care provider. Make sure you discuss any questions you have with your health care provider.   Document Released: 04/20/2010 Document Revised: 04/08/2014 Document Reviewed: 08/19/2013 Elsevier Interactive Patient Education 2016 [place] Maintenance, Female Adopting a healthy lifestyle and getting preventive care can go a long way to promote health and wellness. Talk with your health care provider about what schedule of regular examinations is right for you. This is a good chance for you to check in with your provider about disease prevention and staying healthy. In between checkups, there are plenty of things you can do on your own. Experts have done a lot of research about which lifestyle changes and preventive measures are most likely to keep you healthy. Ask your health care provider for more information. WEIGHT AND DIET  Eat a healthy diet  Be sure to include plenty of vegetables, fruits, low-fat dairy products, and lean protein.  Do not eat a lot of foods high in solid fats, added sugars, or salt.  Get regular exercise. This is one of the most important things you can do for your health.  Most adults should exercise for at least 150 minutes each week. The exercise should increase your heart rate and make you sweat (moderate-intensity exercise).  Most adults should also do strengthening exercises at least twice a week. This is in addition to the moderate-intensity exercise.  Maintain a healthy weight  Body mass index (BMI) is a measurement that can be used to identify possible weight problems. It estimates body fat based on height and weight. Your health care provider can help determine your BMI and help you achieve or maintain a healthy weight.  For females 38 years of age and older:   A BMI below 18.5 is considered underweight.  A BMI of 18.5 to 24.9 is normal.  A BMI of 25 to 29.9 is considered overweight.  A BMI of 30  and above is considered obese.  Watch levels of cholesterol and blood lipids  You should start having your blood tested for lipids and cholesterol at 63 years of age, then have this test every 5 years.  You may need to have your cholesterol levels checked more often if:  Your lipid or cholesterol levels are high.  You are older than 62 years of age.  You are at high risk for heart disease.  CANCER SCREENING   Lung Cancer  Lung cancer screening is recommended for adults 30-36 years old who are at high risk for lung cancer because of a history of smoking.  A yearly low-dose CT scan of the lungs is recommended for people who:  Currently smoke.  Have quit within the past 15 years.  Have at least a 30-pack-year history of smoking. A pack year is smoking an average of one pack of cigarettes a day for 1 year.  Yearly screening should continue until it has been 15 years since you quit.  Yearly screening should stop if you develop a health problem that would prevent you from having lung cancer treatment.  Breast  Cancer  Practice breast self-awareness. This means understanding how your breasts normally appear and feel.  It also means doing regular breast self-exams. Let your health care provider know about any changes, no matter how small.  If you are in your 20s or 30s, you should have a clinical breast exam (CBE) by a health care provider every 1-3 years as part of a regular health exam.  If you are 46 or older, have a CBE every year. Also consider having a breast X-ray (mammogram) every year.  If you have a family history of breast cancer, talk to your health care provider about genetic screening.  If you are at high risk for breast cancer, talk to your health care provider about having an MRI and a mammogram every year.  Breast cancer gene (BRCA) assessment is recommended for women who have family members with BRCA-related cancers. BRCA-related cancers  include:  Breast.  Ovarian.  Tubal.  Peritoneal cancers.  Results of the assessment will determine the need for genetic counseling and BRCA1 and BRCA2 testing. Cervical Cancer Your health care provider may recommend that you be screened regularly for cancer of the pelvic organs (ovaries, uterus, and vagina). This screening involves a pelvic examination, including checking for microscopic changes to the surface of your cervix (Pap test). You may be encouraged to have this screening done every 3 years, beginning at age 23.  For women ages 24-65, health care providers may recommend pelvic exams and Pap testing every 3 years, or they may recommend the Pap and pelvic exam, combined with testing for human papilloma virus (HPV), every 5 years. Some types of HPV increase your risk of cervical cancer. Testing for HPV may also be done on women of any age with unclear Pap test results.  Other health care providers may not recommend any screening for nonpregnant women who are considered low risk for pelvic cancer and who do not have symptoms. Ask your health care provider if a screening pelvic exam is right for you.  If you have had past treatment for cervical cancer or a condition that could lead to cancer, you need Pap tests and screening for cancer for at least 20 years after your treatment. If Pap tests have been discontinued, your risk factors (such as having a new sexual partner) need to be reassessed to determine if screening should resume. Some women have medical problems that increase the chance of getting cervical cancer. In these cases, your health care provider may recommend more frequent screening and Pap tests. Colorectal Cancer  This type of cancer can be detected and often prevented.  Routine colorectal cancer screening usually begins at 62 years of age and continues through 62 years of age.  Your health care provider may recommend screening at an earlier age if you have risk factors for  colon cancer.  Your health care provider may also recommend using home test kits to check for hidden blood in the stool.  A small camera at the end of a tube can be used to examine your colon directly (sigmoidoscopy or colonoscopy). This is done to check for the earliest forms of colorectal cancer.  Routine screening usually begins at age 67.  Direct examination of the colon should be repeated every 5-10 years through 62 years of age. However, you may need to be screened more often if early forms of precancerous polyps or small growths are found. Skin Cancer  Check your skin from head to toe regularly.  Tell your health care provider  about any new moles or changes in moles, especially if there is a change in a mole's shape or color.  Also tell your health care provider if you have a mole that is larger than the size of a pencil eraser.  Always use sunscreen. Apply sunscreen liberally and repeatedly throughout the day.  Protect yourself by wearing long sleeves, pants, a wide-brimmed hat, and sunglasses whenever you are outside. HEART DISEASE, DIABETES, AND HIGH BLOOD PRESSURE   High blood pressure causes heart disease and increases the risk of stroke. High blood pressure is more likely to develop in:  People who have blood pressure in the high end of the normal range (130-139/85-89 mm Hg).  People who are overweight or obese.  People who are African American.  If you are 58-32 years of age, have your blood pressure checked every 3-5 years. If you are 79 years of age or older, have your blood pressure checked every year. You should have your blood pressure measured twice--once when you are at a hospital or clinic, and once when you are not at a hospital or clinic. Record the average of the two measurements. To check your blood pressure when you are not at a hospital or clinic, you can use:  An automated blood pressure machine at a pharmacy.  A home blood pressure monitor.  If you  are between 73 years and 35 years old, ask your health care provider if you should take aspirin to prevent strokes.  Have regular diabetes screenings. This involves taking a blood sample to check your fasting blood sugar level.  If you are at a normal weight and have a low risk for diabetes, have this test once every three years after 62 years of age.  If you are overweight and have a high risk for diabetes, consider being tested at a younger age or more often. PREVENTING INFECTION  Hepatitis B  If you have a higher risk for hepatitis B, you should be screened for this virus. You are considered at high risk for hepatitis B if:  You were born in a country where hepatitis B is common. Ask your health care provider which countries are considered high risk.  Your parents were born in a high-risk country, and you have not been immunized against hepatitis B (hepatitis B vaccine).  You have HIV or AIDS.  You use needles to inject street drugs.  You live with someone who has hepatitis B.  You have had sex with someone who has hepatitis B.  You get hemodialysis treatment.  You take certain medicines for conditions, including cancer, organ transplantation, and autoimmune conditions. Hepatitis C  Blood testing is recommended for:  Everyone born from [birth year] through 1963.  Anyone with known risk factors for hepatitis C. Sexually transmitted infections (STIs)  You should be screened for sexually transmitted infections (STIs) including gonorrhea and chlamydia if:  You are sexually active and are younger than 62 years of age.  You are older than 62 years of age and your health care provider tells you that you are at risk for this type of infection.  Your sexual activity has changed since you were last screened and you are at an increased risk for chlamydia or gonorrhea. Ask your health care provider if you are at risk.  If you do not have HIV, but are at risk, it may be recommended that you  take a prescription medicine daily to prevent HIV infection. This is called pre-exposure prophylaxis (PrEP). You are considered  at risk if:  You are sexually active and do not regularly use condoms or know the HIV status of your partner(s).  You take drugs by injection.  You are sexually active with a partner who has HIV. Talk with your health care provider about whether you are at high risk of being infected with HIV. If you choose to begin PrEP, you should first be tested for HIV. You should then be tested every 3 months for as long as you are taking PrEP.  PREGNANCY   If you are premenopausal and you may become pregnant, ask your health care provider about preconception counseling.  If you may become pregnant, take 400 to 800 micrograms (mcg) of folic acid every day.  If you want to prevent pregnancy, talk to your health care provider about birth control (contraception). OSTEOPOROSIS AND MENOPAUSE   Osteoporosis is a disease in which the bones lose minerals and strength with aging. This can result in serious bone fractures. Your risk for osteoporosis can be identified using a bone density scan.  If you are 26 years of age or older, or if you are at risk for osteoporosis and fractures, ask your health care provider if you should be screened.  Ask your health care provider whether you should take a calcium or vitamin D supplement to lower your risk for osteoporosis.  Menopause may have certain physical symptoms and risks.  Hormone replacement therapy may reduce some of these symptoms and risks. Talk to your health care provider about whether hormone replacement therapy is right for you.  HOME CARE INSTRUCTIONS   Schedule regular health, dental, and eye exams.  Stay current with your immunizations.   Do not use any tobacco products including cigarettes, chewing tobacco, or electronic cigarettes.  If you are pregnant, do not drink alcohol.  If you are breastfeeding, limit how  much and how often you drink alcohol.  Limit alcohol intake to no more than 1 drink per day for nonpregnant women. One drink equals 12 ounces of beer, 5 ounces of wine, or 1 ounces of hard liquor.  Do not use street drugs.  Do not share needles.  Ask your health care provider for help if you need support or information about quitting drugs.  Tell your health care provider if you often feel depressed.  Tell your health care provider if you have ever been abused or do not feel safe at home.   This information is not intended to replace advice given to you by your health care provider. Make sure you discuss any questions you have with your health care provider.   Document Released: 10/01/2010 Document Revised: 04/08/2014 Document Reviewed: 02/17/2013 Elsevier Interactive Patient Education [employer].

## 2015-03-02 ENCOUNTER — Other Ambulatory Visit: Payer: Self-pay | Admitting: Family Medicine

## 2015-03-06 ENCOUNTER — Other Ambulatory Visit: Payer: Self-pay | Admitting: Family Medicine

## 2015-03-22 ENCOUNTER — Other Ambulatory Visit: Payer: Self-pay | Admitting: Family Medicine

## 2015-07-10 ENCOUNTER — Other Ambulatory Visit: Payer: Self-pay | Admitting: Physician Assistant

## 2015-07-12 ENCOUNTER — Other Ambulatory Visit: Payer: Self-pay | Admitting: Physician Assistant

## 2015-07-12 ENCOUNTER — Telehealth: Payer: Self-pay

## 2015-07-12 DIAGNOSIS — I10 Essential (primary) hypertension: Secondary | ICD-10-CM

## 2015-07-12 DIAGNOSIS — E785 Hyperlipidemia, unspecified: Secondary | ICD-10-CM

## 2015-07-12 DIAGNOSIS — Z5181 Encounter for therapeutic drug level monitoring: Secondary | ICD-10-CM

## 2015-07-12 DIAGNOSIS — E039 Hypothyroidism, unspecified: Secondary | ICD-10-CM

## 2015-07-12 DIAGNOSIS — Z113 Encounter for screening for infections with a predominantly sexual mode of transmission: Secondary | ICD-10-CM

## 2015-07-12 NOTE — Telephone Encounter (Signed)
Dr. Clelia CroftShaw, does she have to fast?

## 2015-07-12 NOTE — Telephone Encounter (Signed)
Patient has an appointment with Dr Clelia CroftShaw on 07/20/15 3 pm for follow up on blood work. Patient wants to know if she should come in when we open that day to do the lab work? She stated she wasn't sure if she had to fast and if so she couldn't wait till 3 pm at her appointment time. Please contact patient at [phone]

## 2015-07-12 NOTE — Telephone Encounter (Signed)
Yes, she does need to be fasting, ok to come in in a.m.

## 2015-07-14 NOTE — Telephone Encounter (Signed)
Pt advised.

## 2015-07-20 ENCOUNTER — Other Ambulatory Visit: Payer: Self-pay | Admitting: *Deleted

## 2015-07-20 ENCOUNTER — Ambulatory Visit (INDEPENDENT_AMBULATORY_CARE_PROVIDER_SITE_OTHER): Payer: BLUE CROSS/BLUE SHIELD | Admitting: Family Medicine

## 2015-07-20 ENCOUNTER — Encounter: Payer: Self-pay | Admitting: Family Medicine

## 2015-07-20 VITALS — BP 140/80 | HR 96 | Temp 98.8°F | Resp 16 | Ht 60.0 in | Wt 128.8 lb

## 2015-07-20 DIAGNOSIS — Z5181 Encounter for therapeutic drug level monitoring: Secondary | ICD-10-CM

## 2015-07-20 DIAGNOSIS — E785 Hyperlipidemia, unspecified: Secondary | ICD-10-CM

## 2015-07-20 DIAGNOSIS — E039 Hypothyroidism, unspecified: Secondary | ICD-10-CM | POA: Diagnosis not present

## 2015-07-20 DIAGNOSIS — Z23 Encounter for immunization: Secondary | ICD-10-CM | POA: Diagnosis not present

## 2015-07-20 DIAGNOSIS — Z1159 Encounter for screening for other viral diseases: Secondary | ICD-10-CM

## 2015-07-20 DIAGNOSIS — M1712 Unilateral primary osteoarthritis, left knee: Secondary | ICD-10-CM

## 2015-07-20 DIAGNOSIS — Z113 Encounter for screening for infections with a predominantly sexual mode of transmission: Secondary | ICD-10-CM

## 2015-07-20 DIAGNOSIS — I10 Essential (primary) hypertension: Secondary | ICD-10-CM | POA: Diagnosis not present

## 2015-07-20 DIAGNOSIS — E559 Vitamin D deficiency, unspecified: Secondary | ICD-10-CM | POA: Diagnosis not present

## 2015-07-20 LAB — LIPID PANEL
CHOL/HDL RATIO: 6 ratio — AB (ref <=5.0)
CHOLESTEROL: 260 mg/dL — AB (ref 125–200)
HDL: 43 mg/dL — AB (ref >=46)
TRIGLYCERIDES: 405 mg/dL — AB (ref <150)

## 2015-07-20 LAB — COMPREHENSIVE METABOLIC PANEL
ALBUMIN: 4.4 g/dL (ref 3.6–5.1)
ALK PHOS: 53 U/L (ref 33–130)
ALT: 19 U/L (ref 6–29)
AST: 18 U/L (ref 10–35)
BUN: 16 mg/dL (ref 7–25)
CALCIUM: 10.1 mg/dL (ref 8.6–10.4)
CO2: 27 mmol/L (ref 20–31)
Chloride: 101 mmol/L (ref 98–110)
Creat: 0.81 mg/dL (ref 0.50–0.99)
Glucose, Bld: 123 mg/dL — ABNORMAL HIGH (ref 65–99)
POTASSIUM: 4.7 mmol/L (ref 3.5–5.3)
Sodium: 138 mmol/L (ref 135–146)
TOTAL PROTEIN: 7.7 g/dL (ref 6.1–8.1)
Total Bilirubin: 0.7 mg/dL (ref 0.2–1.2)

## 2015-07-20 LAB — TSH: TSH: 2.04 mIU/L

## 2015-07-20 MED ORDER — BENAZEPRIL-HYDROCHLOROTHIAZIDE 5-6.25 MG PO TABS: 1.0000 | ORAL_TABLET | Freq: Every day | ORAL | Status: DC

## 2015-07-20 MED ORDER — LEVOTHYROXINE SODIUM 112 MCG PO TABS: ORAL_TABLET | ORAL | Status: DC

## 2015-07-20 NOTE — Patient Instructions (Signed)
Try >1500mg  of glucosamine-chrondroiton each (such as a joint move free supplement). Ok to use this in addition to the hyaluronic aicd and the krill oil that is in the [facility] supplement.\ Ok to take the vital reds instead of a multivitamin. Start 2000u of vitamin D a daily Taking 1200mg  of calcium in 2 divided doses - gummies are best.

## 2015-07-20 NOTE — Progress Notes (Signed)
Subjective:    Patient ID: Diana Gilmore, female    DOB: 04/01/1950, 65 y.o.   MRN: [id number] Chief Complaint  Patient presents with  . Follow-up    BLOOD WORK  . Medication Refill    LEVOTHYROXINE and BENAZEPRIL    HPI Ms. Diana Gilmore is a delightful 65 yo woman who is here today for a 6 month follow-up on her chronic medical problems.   HTN: She decided to try to increase exercise and monitor her BP which was running about 130s -  She is starting to walk every morning - will 124-132 - and she is going to do this more regular  Hypothyroidism: Compliant with synthroid but has to take brand name only.  Has noticed her hair dry/thinning  HPL: lipid panel last year was horrible as pt was not fasting  Past Medical History  Diagnosis Date  . Thyroid disease   . Hypertension    Past Surgical History  Procedure Laterality Date  . Thyroid surgery     Current Outpatient Prescriptions on File Prior to Visit  Medication Sig Dispense Refill  . benazepril (LOTENSIN) 5 MG tablet TAKE 1 TABLET (5 MG TOTAL) BY MOUTH DAILY 90 tablet 1  . acetic acid-hydrocortisone (VOSOL-HC) otic solution Place 3 drops into both ears 3 (three) times daily as needed (ear canals itching). (Patient not taking: Reported on 07/20/2015) 10 mL 2  . valACYclovir (VALTREX) 1000 MG tablet At the first sign of a cold sore, take 2 tabs and then take another 2 tabs 12 hours later. (Patient not taking: Reported on 07/20/2015) 20 tablet 2   No current facility-administered medications on file prior to visit.   No Known Allergies No family history on file. Social History   Social History  . Marital Status: Married    Spouse Name: N/A  . Number of Children: N/A  . Years of Education: N/A   Social History Main Topics  . Smoking status: Never Smoker   . Smokeless tobacco: None  . Alcohol Use: None  . Drug Use: None  . Sexual Activity: Not Asked   Other Topics Concern  . None   Social History Narrative     Review of  Systems  Constitutional: Positive for activity change and appetite change. Negative for fever, chills, diaphoresis, fatigue and unexpected weight change.  Eyes: Negative for visual disturbance.  Respiratory: Negative for cough and shortness of breath.   Cardiovascular: Negative for chest pain, palpitations and leg swelling.  Genitourinary: Negative for decreased urine volume.  Skin: Positive for pallor and rash. Negative for wound.  Neurological: Negative for syncope and headaches.  Hematological: Does not bruise/bleed easily.       Objective:   Physical Exam  Constitutional: She is oriented to person, place, and time. She appears well-developed and well-nourished. No distress.  HENT:  Head: Normocephalic and atraumatic.  Right Ear: External ear normal.  Left Ear: External ear normal.  Eyes: Conjunctivae are normal. No scleral icterus.  Neck: Normal range of motion. Neck supple. No thyromegaly present.  Cardiovascular: Normal rate, regular rhythm, normal heart sounds and intact distal pulses.   Pulmonary/Chest: Effort normal and breath sounds normal. No respiratory distress.  Musculoskeletal: She exhibits no edema.  Lymphadenopathy:    She has no cervical adenopathy.  Neurological: She is alert and oriented to person, place, and time.  Skin: Skin is warm and dry. She is not diaphoretic. No erythema.  Psychiatric: She has a normal mood and affect. Her behavior is normal.  BP 140/80 mmHg  Pulse 96  Temp(Src) 98.8 F (37.1 C) (Oral)  Resp 16  Ht 5' (1.524 m)  Wt 128 lb 12.8 oz (58.423 kg)  BMI 25.15 kg/m2  SpO2 97%     Assessment & Plan:  I had placed future orders for cmp, hep C, lipid, tsh Pt did not get shingles vaccine as she was unsure if she had the chicken pox when she was younger - check varicella  1. Hyperlipemia   2. Hypothyroidism, unspecified hypothyroidism type   3. Essential hypertension - bp on high end of normal even with increase in exercise over the past  few mos so will add in hctz to her benazepril. Start higher dose of omega-3s  4. Need for hepatitis C screening test   5. Need for varicella vaccine   6.      Knee OA - try glucosamine-chondroiten supp with prn tylenol. 7.      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 Hepatitis C antibody  . Lipid panel  . TSH  . Comprehensive metabolic panel  . Varicella zoster antibody, IgG    Meds ordered this encounter  Medications  . levothyroxine (SYNTHROID, LEVOTHROID) 112 MCG tablet    Sig: TAKE 1 TABLET (112 MCG TOTAL) BY MOUTH DAILY BEFORE BREAKFAST.    Dispense:  30 tablet    Refill:  0    Medically necessary for brand name only.  . benazepril-hydrochlorthiazide (LOTENSIN HCT) 5-6.25 MG tablet    Sig: Take 1 tablet by mouth daily.    Dispense:  30 tablet    Refill:  0    Language level caveat. Over 40 min spent in face-to-face evaluation of and consultation with patient and coordination of care.  Over 50% of this time was spent counseling this patient.   Norberto Sorenson, MD MPH

## 2015-07-21 LAB — VARICELLA ZOSTER ANTIBODY, IGG: VARICELLA IGG: 2470 {index} — AB (ref <135.00)

## 2015-07-21 LAB — HEPATITIS C ANTIBODY: HCV AB: NEGATIVE

## 2015-07-24 ENCOUNTER — Encounter: Payer: Self-pay | Admitting: Family Medicine

## 2015-08-05 ENCOUNTER — Telehealth: Payer: Self-pay

## 2015-08-05 NOTE — Telephone Encounter (Signed)
Spoke with patient she is to continue medication, put hydro cortisone cream on the dry patch.   She stated she was going to come in to see Dr. Clelia CroftShaw on monday

## 2015-08-05 NOTE — Telephone Encounter (Signed)
Patient was prescribed levothyroxine (SYNTHROID, LEVOTHROID) 112 MCG tablet and benazepril-hydrochlorthiazide (LOTENSIN HCT) 5-6.25 MG tablet at her OV on 07/20/15 with Dr Clelia CroftShaw.  She has developed a dry patch of skin underneath her lip.  She wants to know if she needs to stop taking the medication and if the reaction is serious.  She also wants to know if she should go back to her old Lotensin prescription (benazepril (LOTENSIN) 5 MG tablet).  She wants to know if she should RTC regarding this medication concern.  CB#: [phone]

## 2015-08-07 ENCOUNTER — Ambulatory Visit (INDEPENDENT_AMBULATORY_CARE_PROVIDER_SITE_OTHER): Payer: BLUE CROSS/BLUE SHIELD | Admitting: Family Medicine

## 2015-08-07 VITALS — BP 133/80 | HR 86 | Temp 97.8°F | Resp 16 | Ht 60.0 in | Wt 130.0 lb

## 2015-08-07 DIAGNOSIS — R21 Rash and other nonspecific skin eruption: Secondary | ICD-10-CM | POA: Diagnosis not present

## 2015-08-07 DIAGNOSIS — R7309 Other abnormal glucose: Secondary | ICD-10-CM | POA: Diagnosis not present

## 2015-08-07 DIAGNOSIS — Z5181 Encounter for therapeutic drug level monitoring: Secondary | ICD-10-CM | POA: Diagnosis not present

## 2015-08-07 MED ORDER — LEVOTHYROXINE SODIUM 112 MCG PO TABS: ORAL_TABLET | ORAL | Status: DC

## 2015-08-07 MED ORDER — HYDROCORTISONE VALERATE 0.2 % EX CREA: 1.0000 "application " | TOPICAL_CREAM | Freq: Two times a day (BID) | CUTANEOUS | Status: DC

## 2015-08-07 MED ORDER — BENAZEPRIL HCL 5 MG PO TABS: ORAL_TABLET | ORAL | Status: DC

## 2015-08-07 NOTE — Patient Instructions (Addendum)
IF you received an x-ray today, you will receive an invoice from [facility]. Please contact [facility] at [phone] with questions or concerns regarding your invoice.   IF you received labwork today, you will receive an invoice from [facility]. Please contact [facility] at [phone] with questions or concerns regarding your invoice.   Our billing staff will not be able to assist you with questions regarding bills from these companies.  You will be contacted with the lab results as soon as they are available. The fastest way to get your results is to activate your My Chart account. Instructions are located on the last page of this paperwork. If you have not heard from us regarding the results in 2 weeks, please contact this office.    Not sure if you have this but your blood sugar has been mildly elevated so we will check this at your next visit in September - remember to be fasting. Prediabetes Eating Plan Prediabetes--also called impaired glucose tolerance or impaired fasting glucose--is a condition that causes blood sugar (blood glucose) levels to be higher than normal. Following a healthy diet can help to keep prediabetes under control. It can also help to lower the risk of type 2 diabetes and heart disease, which are increased in people who have prediabetes. Along with regular exercise, a healthy diet:  Promotes weight loss.  Helps to control blood sugar levels.  Helps to improve the way that the body uses insulin. WHAT DO I NEED TO KNOW ABOUT THIS EATING PLAN?  Use the glycemic index (GI) to plan your meals. The index tells you how quickly a food will raise your blood sugar. Choose low-GI foods. These foods take a longer time to raise blood sugar.  Pay close attention to the amount of carbohydrates in the food that you eat. Carbohydrates increase blood sugar levels.  Keep track of how many calories you take in. Eating the right  amount of calories will help you to achieve a healthy weight. Losing about 7 percent of your starting weight can help to prevent type 2 diabetes.  You may want to follow a Mediterranean diet. This diet includes a lot of vegetables, lean meats or fish, whole grains, fruits, and healthy oils and fats. WHAT FOODS CAN I EAT? Grains Whole grains, such as whole-wheat or whole-grain breads, crackers, cereals, and pasta. Unsweetened oatmeal. Bulgur. Barley. Quinoa. Brown rice. Corn or whole-wheat flour tortillas or taco shells. Vegetables Lettuce. Spinach. Peas. Beets. Cauliflower. Cabbage. Broccoli. Carrots. Tomatoes. Squash. Eggplant. Herbs. Peppers. Onions. Cucumbers. Brussels sprouts. Fruits Berries. Bananas. Apples. Oranges. Grapes. Papaya. Mango. Pomegranate. Kiwi. Grapefruit. Cherries. Meats and Other Protein Sources Seafood. Lean meats, such as chicken and [place] or lean cuts of pork and beef. Tofu. Eggs. Nuts. Beans. Dairy Low-fat or fat-free dairy products, such as yogurt, cottage cheese, and cheese. Beverages Water. Tea. Coffee. Sugar-free or diet soda. Seltzer water. Milk. Milk alternatives, such as soy or almond milk. Condiments Mustard. Relish. Low-fat, low-sugar ketchup. Low-fat, low-sugar barbecue sauce. Low-fat or fat-free mayonnaise. Sweets and Desserts Sugar-free or low-fat pudding. Sugar-free or low-fat ice cream and other frozen treats. Fats and Oils Avocado. Walnuts. Olive oil. The items listed above may not be a complete list of recommended foods or beverages. Contact your dietitian for more options.  WHAT FOODS ARE NOT RECOMMENDED? Grains Refined white flour and flour products, such as bread, pasta, snack foods, and cereals. Beverages Sweetened drinks, such as sweet iced tea and soda. Sweets and  Desserts Baked goods, such as cake, cupcakes, pastries, cookies, and cheesecake. The items listed above may not be a complete list of foods and beverages to avoid. Contact your  dietitian for more information.   This information is not intended to replace advice given to you by your health care provider. Make sure you discuss any questions you have with your health care provider.   Document Released: 08/02/2014 Document Reviewed: 08/02/2014 Elsevier Interactive Patient Education [employer]

## 2015-08-07 NOTE — Progress Notes (Signed)
Subjective:  By signing my name below, I, Diana Gilmore, attest that this documentation has been prStann Orer the direction and in the presence of Diana Sorenson, MD. Electronically Signed: Stann Ore, Scribe. 08/07/2015 , 6:05 PM .  Patient was seen in Room 13 .   Patient ID: Diana Gilmore, female    DOB: [birth year], 65 y.o.   MRN: [id number] Chief Complaint  Patient presents with  . Medication Management    thinks she is having a rx to bp med. dry chin   HPI Diana Gilmore is a 65 y.o. female who presents to [facility] for medication management.  Patient was seen by me 2 weeks ago and had changed her benazepril to benazepril-HCTZ. TSH was normal, still on levothyroxine but switched to brand name only. Cholesterol was too high, recommended to work on diet and exercise. Her blood sugar was too high at 123.   Patient noticed some dryness and itchiness over her chin and believes it is due to her medications. She stopped taking her benazepril-HCTZ 2 days ago. She's taken 1 tablet of her previous benazepril today. She's been applying aloe vera at night time. She denies any burning pain over the area. She's been walking for 1 hour around her neighborhood for exercise.   She runs a Diana Gilmore.   Past Medical History  Diagnosis Date  . Thyroid disease   . Hypertension    Prior to Admission medications   Medication Sig Start Date End Date Taking? Authorizing Provider  acetic acid-hydrocortisone (VOSOL-HC) otic solution Place 3 drops into both ears 3 (three) times daily as needed (ear canals itching). Patient not taking: Reported on 07/20/2015 01/19/15   Sherren Mocha, MD  benazepril (LOTENSIN) 5 MG tablet TAKE 1 TABLET (5 MG TOTAL) BY MOUTH DAILY 03/02/15   Sherren Mocha, MD  benazepril-hydrochlorthiazide (LOTENSIN HCT) 5-6.25 MG tablet Take 1 tablet by mouth daily. 07/20/15   Sherren Mocha, MD  levothyroxine (SYNTHROID, LEVOTHROID) 112 MCG tablet TAKE 1 TABLET (112 MCG TOTAL) BY MOUTH DAILY BEFORE  BREAKFAST. 07/20/15   Sherren Mocha, MD  valACYclovir (VALTREX) 1000 MG tablet At the first sign of a cold sore, take 2 tabs and then take another 2 tabs 12 hours later. Patient not taking: Reported on 07/20/2015 01/19/15   Sherren Mocha, MD   No Known Allergies  Review of Systems  Constitutional: Negative for fever, chills and fatigue.  Gastrointestinal: Negative for nausea, vomiting, diarrhea and constipation.  Skin: Positive for rash. Negative for wound.  Neurological: Negative for dizziness, weakness, numbness and headaches.      Objective:   Physical Exam  Constitutional: She is oriented to person, place, and time. She appears well-developed and well-nourished. No distress.  HENT:  Head: Normocephalic and atraumatic.  Very fine papular rash, almost tiny vesicular, around the lower lip, chin and up to the labial folds  Eyes: EOM are normal. Pupils are equal, round, and reactive to light.  Neck: Neck supple. No thyromegaly present.  Cardiovascular: Normal rate, regular rhythm, S1 normal, S2 normal and normal heart sounds.   No murmur heard. Pulmonary/Chest: Effort normal and breath sounds normal. No respiratory distress.  Musculoskeletal: Normal range of motion.  Lymphadenopathy:    She has no cervical adenopathy.  Neurological: She is alert and oriented to person, place, and time.  Skin: Skin is warm and dry.  Psychiatric: She has a normal mood and affect. Her behavior is normal.  Nursing note and vitals reviewed.  BP 133/80 mmHg  Pulse 86  Temp(Src) 97.8 F (36.6 C)  Resp 16  Ht 5' (1.524 m)  Wt 130 lb (58.968 kg)  BMI 25.39 kg/m2    Results for orders placed or performed in visit on 07/20/15  Hepatitis C antibody  Result Value Ref Range   HCV Ab NEGATIVE NEGATIVE  Lipid panel  Result Value Ref Range   Cholesterol 260 (H) 125 - 200 mg/dL   Triglycerides 098405 (H) <150 mg/dL   HDL 43 (L) >=11>=46 mg/dL   Total CHOL/HDL Ratio 6.0 (H) <=5.0 Ratio   VLDL NOT CALC <30 mg/dL    LDL Cholesterol NOT CALC <130 mg/dL  TSH  Result Value Ref Range   TSH 2.04 mIU/L  Comprehensive metabolic panel  Result Value Ref Range   Sodium 138 135 - 146 mmol/L   Potassium 4.7 3.5 - 5.3 mmol/L   Chloride 101 98 - 110 mmol/L   CO2 27 20 - 31 mmol/L   Glucose, Bld 123 (H) 65 - 99 mg/dL   BUN 16 7 - 25 mg/dL   Creat 9.140.81 7.820.50 - 9.560.99 mg/dL   Total Bilirubin 0.7 0.2 - 1.2 mg/dL   Alkaline Phosphatase 53 33 - 130 U/L   AST 18 10 - 35 U/L   ALT 19 6 - 29 U/L   Total Protein 7.7 6.1 - 8.1 g/dL   Albumin 4.4 3.6 - 5.1 g/dL   Calcium 21.310.1 8.6 - 08.610.4 mg/dL  Varicella zoster antibody, IgG  Result Value Ref Range   Varicella IgG 2470.00 (H) <135.00 Index    Assessment & Plan:   1. Elevated glucose  - pt declines recheck labs today - has been working hard on increasing exercise recently so wants to give that some time to improve her health before checking for glucose intolerance.  Diet info in AVS.  2. Medication monitoring encounter   3       Rash - could be from the hctz she started 2 wks prior - improved x 2d off benazepril-hctz but then tried the benazepril-hctz again and it worsened again.  Switch back to benazepril 5 which she tol well prior.  Try hydrocortisone to rash - reviewed side effects/risks. 4.      Hypothyroidism - tsh stable, cont brand name only synthroid  Recheck in 4 mos.  Orders Placed This Encounter  Procedures  . POCT glucose (manual entry)  . POCT glycosylated hemoglobin (Hb A1C)    Meds ordered this encounter  Medications  . benazepril (LOTENSIN) 5 MG tablet    Sig: TAKE 1 TABLET (5 MG TOTAL) BY MOUTH DAILY    Dispense:  90 tablet    Refill:  1  . levothyroxine (SYNTHROID, LEVOTHROID) 112 MCG tablet    Sig: TAKE 1 TABLET (112 MCG TOTAL) BY MOUTH DAILY BEFORE BREAKFAST.    Dispense:  90 tablet    Refill:  0    Medically necessary for brand name only.  . hydrocortisone valerate cream (WESTCORT) 0.2 %    Sig: Apply 1 application topically 2 (two)  times daily.    Dispense:  45 g    Refill:  0    I personally performed the services described in this documentation, which was scribed in my presence. The recorded information has been reviewed and considered, and addended by me as needed.  Diana SorensonEva Azaryah Heathcock, MD MPH

## 2015-08-18 ENCOUNTER — Telehealth: Payer: Self-pay | Admitting: Family Medicine

## 2015-08-18 NOTE — Telephone Encounter (Signed)
Patient has been scheduled for 12/21/2015 @ 8:00 am

## 2015-08-18 NOTE — Telephone Encounter (Signed)
-----   Message from Sherren MochaEva N Shaw, MD sent at 08/07/2015 10:57 PM EDT ----- Please sched pt with me at 104 in 4 mos - will need to be fasting for labs - ok to come early for labs if afternoon appt.

## 2015-08-27 ENCOUNTER — Other Ambulatory Visit: Payer: Self-pay | Admitting: Family Medicine

## 2015-09-01 ENCOUNTER — Telehealth: Payer: Self-pay

## 2015-09-01 NOTE — Telephone Encounter (Signed)
Pharm faxed request to change pt from name brand synthroid to generic, per pt req. All of the OV notes we have on pt specifies that pt needs name brand only, but I don't see reason. Called pharm and they do not have any notes on why they req'd the change. I LMOM for pt to CB to clarify if she wants to switch to generic and why. Also, why was she put on name brand only to begin with?

## 2015-09-06 ENCOUNTER — Other Ambulatory Visit: Payer: Self-pay | Admitting: Family Medicine

## 2015-10-27 ENCOUNTER — Telehealth: Payer: Self-pay

## 2015-10-27 NOTE — Telephone Encounter (Signed)
Diana Gilmore from [facility] called to report that the last two RFs of Synthroid have been dispensed as generic instead of name brand. She wanted to know whether to keep her on generic, have her come in for TSH, or put her back on name brand. I spoke to Dr Clelia Croft who stated that she wants the pt on name brand due to SEs of hair loss and itchiness when pt was taking the generic. Called pharm back to advise and they agreed to dispense name brand.

## 2015-11-04 ENCOUNTER — Other Ambulatory Visit: Payer: Self-pay | Admitting: Family Medicine

## 2015-12-21 ENCOUNTER — Ambulatory Visit: Payer: BLUE CROSS/BLUE SHIELD | Admitting: Family Medicine

## 2015-12-26 ENCOUNTER — Ambulatory Visit (INDEPENDENT_AMBULATORY_CARE_PROVIDER_SITE_OTHER): Payer: BLUE CROSS/BLUE SHIELD | Admitting: Family Medicine

## 2015-12-26 VITALS — BP 122/76 | HR 84 | Temp 98.0°F | Resp 16 | Ht 61.0 in | Wt 130.8 lb

## 2015-12-26 DIAGNOSIS — E119 Type 2 diabetes mellitus without complications: Secondary | ICD-10-CM

## 2015-12-26 DIAGNOSIS — Z5181 Encounter for therapeutic drug level monitoring: Secondary | ICD-10-CM | POA: Diagnosis not present

## 2015-12-26 DIAGNOSIS — E860 Dehydration: Secondary | ICD-10-CM | POA: Diagnosis not present

## 2015-12-26 DIAGNOSIS — I10 Essential (primary) hypertension: Secondary | ICD-10-CM

## 2015-12-26 DIAGNOSIS — E039 Hypothyroidism, unspecified: Secondary | ICD-10-CM

## 2015-12-26 DIAGNOSIS — R7309 Other abnormal glucose: Secondary | ICD-10-CM

## 2015-12-26 DIAGNOSIS — E785 Hyperlipidemia, unspecified: Secondary | ICD-10-CM | POA: Diagnosis not present

## 2015-12-26 MED ORDER — HYDROCORTISONE-ACETIC ACID 1-2 % OT SOLN: 3.0000 [drp] | Freq: Four times a day (QID) | OTIC | 5 refills | Status: DC | PRN

## 2015-12-26 MED ORDER — BENAZEPRIL HCL 5 MG PO TABS: ORAL_TABLET | ORAL | 3 refills | Status: DC

## 2015-12-26 NOTE — Patient Instructions (Addendum)
   IF you received an x-ray today, you will receive an invoice from [facility]. Please contact [facility] at [phone] with questions or concerns regarding your invoice.   IF you received labwork today, you will receive an invoice from [facility]. Please contact [facility] at [phone] with questions or concerns regarding your invoice.   Our billing staff will not be able to assist you with questions regarding bills from these companies.  You will be contacted with the lab results as soon as they are available. The fastest way to get your results is to activate your My Chart account. Instructions are located on the last page of this paperwork. If you have not heard from us regarding the results in 2 weeks, please contact this office.      Dehydration, Adult Dehydration is a condition in which you do not have enough fluid or water in your body. It happens when you take in less fluid than you lose. Vital organs such as the kidneys, brain, and heart cannot function without a proper amount of fluids. Any loss of fluids from the body can cause dehydration.  Dehydration can range from mild to severe. This condition should be treated right away to help prevent it from becoming severe. CAUSES  This condition may be caused by:  Vomiting.  Diarrhea.  Excessive sweating, such as when exercising in hot or humid weather.  Not drinking enough fluid during strenuous exercise or during an illness.  Excessive urine output.  Fever.  Certain medicines. RISK FACTORS This condition is more likely to develop in:  People who are taking certain medicines that cause the body to lose excess fluid (diuretics).   People who have a chronic illness, such as diabetes, that may increase urination.  Older adults.   People who live at high altitudes.   People who participate in endurance sports.  SYMPTOMS  Mild Dehydration  Thirst.  Dry  lips.  Slightly dry mouth.  Dry, warm skin. Moderate Dehydration  Very dry mouth.   Muscle cramps.   Dark urine and decreased urine production.   Decreased tear production.   Headache.   Light-headedness, especially when you stand up from a sitting position.  Severe Dehydration  Changes in skin.   Cold and clammy skin.   Skin does not spring back quickly when lightly pinched and released.   Changes in body fluids.   Extreme thirst.   No tears.   Not able to sweat when body temperature is high, such as in hot weather.   Minimal urine production.   Changes in vital signs.   Rapid, weak pulse (more than 100 beats per minute when you are sitting still).   Rapid breathing.   Low blood pressure.   Other changes.   Sunken eyes.   Cold hands and feet.   Confusion.  Lethargy and difficulty being awakened.  Fainting (syncope).   Short-term weight loss.   Unconsciousness. DIAGNOSIS  This condition may be diagnosed based on your symptoms. You may also have tests to determine how severe your dehydration is. These tests may include:   Urine tests.   Blood tests.  TREATMENT  Treatment for this condition depends on the severity. Mild or moderate dehydration can often be treated at home. Treatment should be started right away. Do not wait until dehydration becomes severe. Severe dehydration needs to be treated at the hospital. Treatment for Mild Dehydration  Drinking plenty of water to replace the fluid you have lost.     Replacing minerals in your blood (electrolytes) that you may have lost.  Treatment for Moderate Dehydration  Consuming oral rehydration solution (ORS). Treatment for Severe Dehydration  Receiving fluid through an IV tube.   Receiving electrolyte solution through a feeding tube that is passed through your nose and into your stomach (nasogastric tube or NG tube).  Correcting any abnormalities in  electrolytes. HOME CARE INSTRUCTIONS   Drink enough fluid to keep your urine clear or pale yellow.   Drink water or fluid slowly by taking small sips. You can also try sucking on ice cubes.  Have food or beverages that contain electrolytes. Examples include bananas and sports drinks.  Take over-the-counter and prescription medicines only as told by your health care provider.   Prepare ORS according to the manufacturer's instructions. Take sips of ORS every 5 minutes until your urine returns to normal.  If you have vomiting or diarrhea, continue to try to drink water, ORS, or both.   If you have diarrhea, avoid:   Beverages that contain caffeine.   Fruit juice.   Milk.   Carbonated soft drinks.  Do not take salt tablets. This can lead to the condition of having too much sodium in your body (hypernatremia).  SEEK MEDICAL CARE IF:  You cannot eat or drink without vomiting.  You have had moderate diarrhea during a period of more than 24 hours.  You have a fever. SEEK IMMEDIATE MEDICAL CARE IF:   You have extreme thirst.  You have severe diarrhea.  You have not urinated in 6-8 hours, or you have urinated only a small amount of very dark urine.  You have shriveled skin.  You are dizzy, confused, or both.   This information is not intended to replace advice given to you by your health care provider. Make sure you discuss any questions you have with your health care provider.   Document Released: 03/18/2005 Document Revised: 12/07/2014 Document Reviewed: 08/03/2014 Elsevier Interactive Patient Education 2016 Else[employer] Inc.  

## 2015-12-26 NOTE — Progress Notes (Addendum)
By signing my name below, I, Diana Gilmore, attest that this documentation has been prepared under the direction and in the presence of Diana Sorenson, MD.  Electronically Signed: Arvilla Gilmore, Medical Scribe. 12/26/15. 10:20 AM.  Subjective:    Patient ID: Diana Gilmore, female    DOB: 09/02/1950, 65 y.o.   MRN: [id number]  HPI Chief Complaint  Patient presents with  . Follow-up    med check    HPI Comments: Diana Gilmore is a 65 y.o. female who presents to the [facility] for medication check. Switched her thyroid to brand name only. She was complaining of skin itchiness and dryness. She tried to add HCTZ to her benazepril but worried about worsening skin issues so she switched herself back. Pharmacy called 2 months ago and noted they maintained her on generic instead of brand name instead during that time, on brand name for the past 2 months. She did have elevated blood sugar with last labs in April of 123, fasting. Lipid panel should total 216, non HLD of 217.  Pt states the brand name medication works better. Pt has taken synthroid this morning. Pt is still exercising and eats more vegetables in her meal than meat. Pt is fasting and has only had water today. Pt denies urinary problems, and abdominal pain.  Wt Readings from Last 3 Encounters:  12/26/15 130 lb 12.8 oz (59.3 kg)  08/07/15 130 lb (59 kg)  07/20/15 128 lb 12.8 oz (58.4 kg)   Ear: Pt reports ear itchiness R>L  Skin: Pt reports dry skin. Pt drinks a big bottle of water a day.  There are no active problems to display for this patient.  Past Medical History:  Diagnosis Date  . Hypertension   . Thyroid disease    Past Surgical History:  Procedure Laterality Date  . THYROID SURGERY     No Known Allergies Prior to Admission medications   Medication Sig Start Date End Date Taking? Authorizing Provider  benazepril (LOTENSIN) 5 MG tablet TAKE 1 TABLET (5 MG TOTAL) BY MOUTH DAILY 09/06/15  Yes Sherren Mocha, MD    hydrocortisone valerate cream (WESTCORT) 0.2 % Apply 1 application topically 2 (two) times daily. 08/07/15  Yes Sherren Mocha, MD  SYNTHROID 112 MCG tablet TAKE 1 TABLET BY MOUTH DAILY BEFORE BREAKFAST. 11/05/15  Yes Sherren Mocha, MD  valACYclovir (VALTREX) 1000 MG tablet At the first sign of a cold sore, take 2 tabs and then take another 2 tabs 12 hours later. 01/19/15  Yes Sherren Mocha, MD  SYNTHROID 112 MCG tablet TAKE 1 TABLET BY MOUTH EVERY DAY BEFORE BREAKFAST Patient not taking: Reported on 12/26/2015 11/05/15   Sherren Mocha, MD   Social History   Social History  . Marital status: Married    Spouse name: N/A  . Number of children: N/A  . Years of education: N/A   Occupational History  . Not on file.   Social History Main Topics  . Smoking status: Never Smoker  . Smokeless tobacco: Not on file  . Alcohol use Not on file  . Drug use: Unknown  . Sexual activity: Not on file   Other Topics Concern  . Not on file   Social History Narrative  . No narrative on file   Depression screen [facility] 2/9 12/26/2015 08/07/2015 07/20/2015 01/19/2015 09/05/2014  Decreased Interest 0 0 0 0 0  Down, Depressed, Hopeless 0 0 0 0 0  PHQ - 2 Score 0  0 0 0 0   Review of Systems  HENT: Negative for sore throat.   Gastrointestinal: Negative for abdominal pain.  Genitourinary: Negative for difficulty urinating, dysuria, frequency, hematuria and urgency.    Objective:  Physical Exam  Constitutional: She appears well-developed and well-nourished. No distress.  HENT:  Head: Normocephalic and atraumatic.  Right Ear: Tympanic membrane normal.  Left Ear: Tympanic membrane normal.  Mouth/Throat: Oropharynx is clear and moist.  Rt and lt canal with some erythema Nares nl  Eyes: Conjunctivae are normal.  Neck: Neck supple. No thyromegaly (slight rit thyroid nodule) present.  Cardiovascular: Normal rate, regular rhythm and normal heart sounds.  Exam reveals no gallop and no friction rub.   No murmur  heard. Pulmonary/Chest: Effort normal and breath sounds normal. No respiratory distress. She has no wheezes. She has no rales.  Lymphadenopathy:    She has no cervical adenopathy.  Neurological: She is alert.  Skin: Skin is warm and dry.  Psychiatric: She has a normal mood and affect. Her behavior is normal.  Nursing note and vitals reviewed. BP 122/76 (BP Location: Right Arm, Patient Position: Sitting, Cuff Size: Normal)   Pulse 84   Temp 98 F (36.7 C) (Oral)   Resp 16   Ht 5\' 1"  (1.549 m)   Wt 130 lb 12.8 oz (59.3 kg)   SpO2 97%   BMI 24.71 kg/m  Assessment & Plan:   1. Essential hypertension - cont benazepril, well-controlled  2. Hyperlipemia   3. Hypothyroidism, unspecified hypothyroidism type - doing better on brand-name Synthroid - refill when tsh returns  4. Medication monitoring encounter   5. Elevated glucose   6. Dehydration   7.      New diagnosis DM with hgba1c 6.5 - rx glucometer and refer to DM teaching (hopefully they can show her how to use the glucometer there). No need to start meds at this time. Recheck in 4 mos.  C/o ear canals itching rt>lt - try vocol hc  Orders Placed This Encounter  Procedures  . Comprehensive metabolic panel    Order Specific Question:   Has the patient fasted?    Answer:   Yes  . TSH  . Lipid panel    Order Specific Question:   Has the patient fasted?    Answer:   Yes  . Hemoglobin A1c  . Care order/instruction:    AVS and GO after labs drawn    Scheduling Instructions:     AVS and GO    Meds ordered this encounter  Medications  . acetic acid-hydrocortisone (VOSOL-HC) otic solution    Sig: Place 3 drops into both ears 4 (four) times daily as needed (ear itching).    Dispense:  10 mL    Refill:  5  . benazepril (LOTENSIN) 5 MG tablet    Sig: TAKE 1 TABLET (5 MG TOTAL) BY MOUTH DAILY    Dispense:  90 tablet    Refill:  3    I personally performed the services described in this documentation, which was scribed in my  presence. The recorded information has been reviewed and considered, and addended by me as needed.   Diana SorensonEva Donise Gilmore, M.D.  [facility]  [place] [address] [place], [place] [address] [phone] phone [phone] fax  12/27/15 9:34 AM  Results for orders placed or performed in visit on 12/26/15  Hemoglobin A1c  Result Value Ref Range   Hgb A1c MFr Bld 6.5 (H) <5.7 %  Mean Plasma Glucose 140 mg/dL

## 2015-12-27 DIAGNOSIS — E785 Hyperlipidemia, unspecified: Secondary | ICD-10-CM | POA: Insufficient documentation

## 2015-12-27 DIAGNOSIS — E119 Type 2 diabetes mellitus without complications: Secondary | ICD-10-CM | POA: Insufficient documentation

## 2015-12-27 LAB — HEMOGLOBIN A1C
Hgb A1c MFr Bld: 6.5 % — ABNORMAL HIGH (ref <5.7)
Mean Plasma Glucose: 140 mg/dL

## 2015-12-27 MED ORDER — D-CARE GLUCOMETER W/DEVICE KIT: 1.0000 [IU] | PACK | Freq: Every day | 0 refills | Status: DC

## 2015-12-28 LAB — LIPID PANEL
CHOL/HDL RATIO: 7.1 ratio — AB (ref <=5.0)
CHOLESTEROL: 262 mg/dL — AB (ref 125–200)
HDL: 37 mg/dL — AB (ref >=46)
Triglycerides: 552 mg/dL — ABNORMAL HIGH (ref <150)

## 2015-12-28 LAB — COMPREHENSIVE METABOLIC PANEL
ALBUMIN: 4.4 g/dL (ref 3.6–5.1)
ALT: 18 U/L (ref 6–29)
AST: 17 U/L (ref 10–35)
Alkaline Phosphatase: 52 U/L (ref 33–130)
BUN: 14 mg/dL (ref 7–25)
CHLORIDE: 104 mmol/L (ref 98–110)
CO2: 28 mmol/L (ref 20–31)
Calcium: 9.5 mg/dL (ref 8.6–10.4)
Creat: 0.72 mg/dL (ref 0.50–0.99)
Glucose, Bld: 128 mg/dL — ABNORMAL HIGH (ref 65–99)
POTASSIUM: 4.2 mmol/L (ref 3.5–5.3)
Sodium: 139 mmol/L (ref 135–146)
TOTAL PROTEIN: 7.3 g/dL (ref 6.1–8.1)
Total Bilirubin: 0.5 mg/dL (ref 0.2–1.2)

## 2015-12-28 LAB — TSH: TSH: 1.75 m[IU]/L

## 2016-01-06 ENCOUNTER — Other Ambulatory Visit: Payer: Self-pay | Admitting: Family Medicine

## 2016-01-09 NOTE — Telephone Encounter (Signed)
12/2015 last ov and normal tsh.

## 2016-01-12 ENCOUNTER — Other Ambulatory Visit: Payer: Self-pay | Admitting: Family Medicine

## 2016-02-14 ENCOUNTER — Ambulatory Visit: Payer: Self-pay | Admitting: Skilled Nursing Facility1

## 2016-02-16 MED ORDER — SYNTHROID 112 MCG PO TABS: ORAL_TABLET | ORAL | 1 refills | Status: DC

## 2016-02-16 NOTE — Telephone Encounter (Signed)
12/2015 last ov and nl tsh 

## 2016-06-18 DIAGNOSIS — H348112 Central retinal vein occlusion, right eye, stable: Secondary | ICD-10-CM | POA: Insufficient documentation

## 2016-06-18 DIAGNOSIS — H2513 Age-related nuclear cataract, bilateral: Secondary | ICD-10-CM | POA: Insufficient documentation

## 2016-06-18 DIAGNOSIS — H3581 Retinal edema: Secondary | ICD-10-CM | POA: Insufficient documentation

## 2016-06-18 DIAGNOSIS — H44111 Panuveitis, right eye: Secondary | ICD-10-CM | POA: Insufficient documentation

## 2016-08-12 ENCOUNTER — Other Ambulatory Visit: Payer: Self-pay | Admitting: Family Medicine

## 2016-08-13 ENCOUNTER — Telehealth: Payer: Self-pay | Admitting: Family Medicine

## 2016-08-13 NOTE — Telephone Encounter (Signed)
LMOM TO CALL AND SCHEDULE APPOINTMENT WITH [facility] PT IS WAY OVER DUE FOR AN OV WITH FASTING LABS SCHEDULE AN APPOINTMENT WITHIN A MONTH TO SEE DR Clelia CroftSHAW

## 2016-08-13 NOTE — Telephone Encounter (Signed)
Pt is long-overdue for a f/u OV with FASTING labs. Please have her sched an appt within the next month so we can continue to make sure the medicines are keeping her healthy and safe before we refill them further. Thanks. 

## 2016-08-30 ENCOUNTER — Encounter: Payer: BLUE CROSS/BLUE SHIELD | Admitting: Family Medicine

## 2016-08-31 ENCOUNTER — Encounter: Payer: Self-pay | Admitting: Family Medicine

## 2016-08-31 ENCOUNTER — Ambulatory Visit (INDEPENDENT_AMBULATORY_CARE_PROVIDER_SITE_OTHER): Payer: BLUE CROSS/BLUE SHIELD | Admitting: Family Medicine

## 2016-08-31 VITALS — BP 149/91 | HR 91 | Temp 98.0°F | Resp 16 | Ht 59.5 in | Wt 126.2 lb

## 2016-08-31 DIAGNOSIS — E7800 Pure hypercholesterolemia, unspecified: Secondary | ICD-10-CM

## 2016-08-31 DIAGNOSIS — E079 Disorder of thyroid, unspecified: Secondary | ICD-10-CM

## 2016-08-31 DIAGNOSIS — E119 Type 2 diabetes mellitus without complications: Secondary | ICD-10-CM

## 2016-08-31 DIAGNOSIS — I10 Essential (primary) hypertension: Secondary | ICD-10-CM | POA: Insufficient documentation

## 2016-08-31 LAB — POCT URINALYSIS DIP (MANUAL ENTRY)
BILIRUBIN UA: NEGATIVE
Glucose, UA: NEGATIVE mg/dL
Ketones, POC UA: NEGATIVE mg/dL
NITRITE UA: NEGATIVE
PH UA: 7 (ref 5.0–8.0)
PROTEIN UA: NEGATIVE mg/dL
Spec Grav, UA: 1.01 (ref 1.010–1.025)
UROBILINOGEN UA: 0.2 U/dL

## 2016-08-31 LAB — POCT GLYCOSYLATED HEMOGLOBIN (HGB A1C): HEMOGLOBIN A1C: 6.5

## 2016-08-31 MED ORDER — D-CARE GLUCOMETER W/DEVICE KIT: 1.0000 [IU] | PACK | Freq: Every day | 0 refills | Status: DC

## 2016-08-31 NOTE — Progress Notes (Signed)
Pt given free meter and taught how to use, rx sent to pharmacy Printed diabetic info given to pt. Diet, exercise and abcs

## 2016-08-31 NOTE — Progress Notes (Addendum)
By signing my name below, I, Mesha Guinyard, attest that this documentation has been prepared under the direction and in the presence of Delman Cheadle, MD.  Electronically Signed: Verlee Monte, Medical Scribe. 08/31/16. 12:17 AM.  Subjective:    Patient ID: Diana Gilmore, female    DOB: 1951/02/07, 66 y.o.   MRN: [id number]  HPI  Chief Complaint  Patient presents with  . Annual Exam    HPI Comments: Diana Gilmore is a 66 y.o. female who presents to Primary Care at [facility] for her complete physical, but due to her chronic medical diseases, we decided to address and defer her physical until her next visit. Pt had new diagnoses of DM with Hgb A1c 6.5, 8 months prior. She was referred to diabetic education and rx for glucometer was sent to the pharmacy. The diabetic teaching center was unable to contact pt. She saw ophthalmology Dr. Manuella Ghazi at [facility] 06/18/16; as a referral from Dr. Katy Fitch due to macular edema. 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 A wide variety of specific infectious and autoimmune labs were drawn. She is on steriods and atropine eye drops for the panuveitis and treated with oral prednisone 60 mg QD. 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 BP elevated at that visit bp 146/68. She had to stop the prednisone after several days of taking it due to dizziness. She was supposed to recheck in 1 week but I do not see that this has ever happened.  Reports her eyes are feeling better and she can now see in her right eye. Pt only takes eye drops and is no longer taking prednisone. Pt states she is followed by Dr. Janan Halter and was told it may take a year for her recovery. Her next appt with him is 10/03/16. Pt did not get a phone call letting her know about her lab work.  HTN: Pt's bp runs about 115-126/72-72. Lab Results  Component Value Date   CREATININE 0.72 12/26/2015   BP Readings from Last 3  Encounters:  08/31/16 (!) 149/91  12/26/15 122/76  08/07/15 133/80   Thyroid Disease: Pt is complaint with synthroid and takes it with water in the morning. She states she feels better and she sleeps fine.  DM: Pt doesn't know she has DM. She agrees to change her diet to control her blood sugar. Lab Results  Component Value Date   HGBA1C 6.5 (H) 12/26/2015   Diet/Exercise: Pt eats a lot of vegetables and  Drinks water. She eats a cup of rice most days of the week, rice noodle, and takes a variety of supplements such as Vit D 100 units, eye vitamin, multivitamins after lunch. She exercises 30-45 mins in the morning 4-5x a week. When she walks she can't talk to the person and sweats. Pt still works full time. Reports brown rice is too hard for her. Denies eating a lot of bread, potatoes, and drinking juice.  Immunizations: Pt agrees to get her PNA vaccine and tdap today. Immunization History  Administered Date(s) Administered  . Td 04/02/2007   Depression Screening: Depression screen [facility] 2/9 08/31/2016 12/26/2015 08/07/2015 07/20/2015 01/19/2015  Decreased Interest 0 0 0 0 0  Down, Depressed, Hopeless 0 0 0 0 0  PHQ - 2 Score 0 0 0 0 0  Altered sleeping 0 - - - -  Tired, decreased energy 0 - - - -  Change in  appetite 0 - - - -  Feeling bad or failure about yourself  0 - - - -  Trouble concentrating 0 - - - -  Moving slowly or fidgety/restless 0 - - - -  Suicidal thoughts 0 - - - -  PHQ-9 Score 0 - - - -   Patient Active Problem List   Diagnosis Date Noted  . Hyperlipemia 12/27/2015  . Type 2 diabetes mellitus without complication, without long-term current use of insulin ([place]) 12/27/2015   Past Medical History:  Diagnosis Date  . Hypertension   . Thyroid disease    Past Surgical History:  Procedure Laterality Date  . THYROID SURGERY     No Known Allergies Prior to Admission medications   Medication Sig Start Date End Date Taking? Authorizing Provider  acetic  acid-hydrocortisone (VOSOL-HC) otic solution Place 3 drops into both ears 4 (four) times daily as needed (ear itching). 12/26/15  Yes Shawnee Knapp, MD  benazepril (LOTENSIN) 5 MG tablet TAKE 1 TABLET (5 MG TOTAL) BY MOUTH DAILY 12/26/15  Yes Shawnee Knapp, MD  Blood Glucose Monitoring Suppl (D-CARE GLUCOMETER) w/Device KIT 1 Units by Does not apply route daily. 12/27/15  Yes Shawnee Knapp, MD  Cholecalciferol (VITAMIN D3) 1000 units CAPS Take by mouth.   Yes [provider]  hydrocortisone valerate cream (WESTCORT) 0.2 % Apply 1 application topically 2 (two) times daily. 08/07/15  Yes Shawnee Knapp, MD  Multiple Vitamins-Minerals (CENTRUM SILVER 50+WOMEN) TABS Take 1 tablet by mouth.   Yes [provider]  SYNTHROID 112 MCG tablet TAKE 1 TABLET BY MOUTH DAILY BEFORE BREAKFAST. 02/16/16  Yes Shawnee Knapp, MD  SYNTHROID 112 MCG tablet Take 1 tablet (112 mcg total) by mouth daily before breakfast. Needs office visit for any refills. 08/13/16  Yes Shawnee Knapp, MD  valACYclovir (VALTREX) 1000 MG tablet At the first sign of a cold sore, take 2 tabs and then take another 2 tabs 12 hours later. 01/19/15  Yes Shawnee Knapp, MD   Social History   Social History  . Marital status: Married    Spouse name: N/A  . Number of children: N/A  . Years of education: N/A   Occupational History  . Not on file.   Social History Main Topics  . Smoking status: Never Smoker  . Smokeless tobacco: Never Used  . Alcohol use No  . Drug use: No  . Sexual activity: Yes   Other Topics Concern  . Not on file   Social History Narrative  . No narrative on file   Review of Systems  13 point ROS is negative. Objective:  Physical Exam  Constitutional: She appears well-developed and well-nourished. No distress.  HENT:  Head: Normocephalic and atraumatic.  Eyes: Conjunctivae are normal.  Neck: Neck supple. No thyromegaly present.  Cardiovascular: Regular rhythm, S1 normal, S2 normal and normal heart sounds.   Tachycardia present.  Exam reveals no gallop and no friction rub.   No murmur heard. Pulmonary/Chest: Effort normal and breath sounds normal. No respiratory distress. She has no wheezes. She has no rales.  Lungs clear to auscultation  Lymphadenopathy:    She has no cervical adenopathy.  Neurological: She is alert.  Skin: Skin is warm and dry.  Psychiatric: She has a normal mood and affect. Her behavior is normal.  Nursing note and vitals reviewed.   Vitals:   08/31/16 1120  BP: (!) 149/91  Pulse: 91  Resp: 16  Temp: 98 F (  36.7 C)  TempSrc: Oral  SpO2: 97%  Weight: 126 lb 4 oz (57.3 kg)  Height: 4' 11.5" (1.511 m)  Body mass index is 25.07 kg/m.   Results for orders placed or performed in visit on 08/31/16  POCT glycosylated hemoglobin (Hb A1C)  Result Value Ref Range   Hemoglobin A1C 6.5   POCT urinalysis dipstick  Result Value Ref Range   Color, UA yellow yellow   Clarity, UA clear clear   Glucose, UA negative negative mg/dL   Bilirubin, UA negative negative   Ketones, POC UA negative negative mg/dL   Spec Grav, UA 1.010 1.010 - 1.025   Blood, UA trace-lysed (A) negative   pH, UA 7.0 5.0 - 8.0   Protein Ur, POC negative negative mg/dL   Urobilinogen, UA 0.2 0.2 or 1.0 E.U./dL   Nitrite, UA Negative Negative   Leukocytes, UA Trace (A) Negative   Assessment & Plan:  APPARENTLY PT WAS NOT GIVE TDAP AND PNEUMOVAX PRIOR TO HER DISCHARGE - NEED TO DO AT f?U 1. Type 2 diabetes mellitus without complication, without long-term current use of insulin ([place])   2. Pure hypercholesterolemia   3. Thyroid disease   4. Essential hypertension     Orders Placed This Encounter  Procedures  . Tdap vaccine greater than or equal to 7yo IM  . Pneumococcal polysaccharide vaccine 23-valent greater than or equal to 2yo subcutaneous/IM  . TSH  . Comprehensive metabolic panel    Order Specific Question:   Has the patient fasted?    Answer:   Yes  . Lipid panel    Order Specific  Question:   Has the patient fasted?    Answer:   Yes  . CBC  . POCT glycosylated hemoglobin (Hb A1C)  . POCT urinalysis dipstick    Meds ordered this encounter  Medications  . Multiple Vitamins-Minerals (CENTRUM SILVER 50+WOMEN) TABS    Sig: Take 1 tablet by mouth.  . Cholecalciferol (VITAMIN D3) 1000 units CAPS    Sig: Take by mouth.  . Blood Glucose Monitoring Suppl (D-CARE GLUCOMETER) w/Device KIT    Sig: 1 Units by Does not apply route daily.    Dispense:  1 kit    Refill:  0    Please dispense glucometer, strips, and lancets of any brand/type that works with [employer] and she prefers. Will check cbgs qd due to hyperglycemia and diet changes. Dx E11.9  . SYNTHROID 100 MCG tablet    Sig: Take 1 tablet (100 mcg total) by mouth daily before breakfast. Brand name only medically necessary.    Dispense:  90 tablet    Refill:  0    Brand name only Medically necessary.    I personally performed the services described in this documentation, which was scribed in my presence. The recorded information has been reviewed and considered, and addended by me as needed.   Delman Cheadle, M.D.  Primary Care at [facility] [address] [place], [place] [address] [phone] phone [phone] fax  09/02/16 11:56 PM

## 2016-08-31 NOTE — Patient Instructions (Addendum)
IF you received an x-ray today, you will receive an invoice from [facility]. Please contact [facility] at [phone] with questions or concerns regarding your invoice.   IF you received labwork today, you will receive an invoice from [facility]. Please contact [facility] at [phone] with questions or concerns regarding your invoice.   Our billing staff will not be able to assist you with questions regarding bills from these companies.  You will be contacted with the lab results as soon as they are available. The fastest way to get your results is to activate your My Chart account. Instructions are located on the last page of this paperwork. If you have not heard from [place] regarding the results in 2 weeks, please contact this office.      Theo di ???ng huy?t, Ng??i l?n (Blood Glucose Monitoring, Adult) Theo di ???ng huy?t (???ng glucose) gip qu v? qu?n l b?nh ti?u ???ng c?a mnh. Vi?c ny c?ng gip qu v? v chuyn gia ch?m [place] s?c kh?e c?a qu v? xc ??nh k? ho?ch qu?n l ti?u ???ng c?a qu v? hi?u qu? ??n ?u. Theo di ???ng huy?t bao g?m vi?c ki?m tra ???ng huy?t th??ng xuyn nh? ch? d?n v duy tr vi?c ghi chp (nh?t k) cc k?t qu? c?a qu v? theo th?i gian. T?I SAO TI NN THEO DI ???NG HUY?T C?A TI? Vi?c ki?m tra ??u ??n ???ng huy?t c?a qu v? c th?:  Gip qu v? hi?u r th?c ?n, t?p th? d?c, b?nh t?t v thu?c men ?nh h??ng nh? th? no ??n ???ng huy?t c?a qu v?.  Cho qu v? bi?t v? tnh tr?ng ???ng huy?t c?a qu v? b?t k? lc no. Qu v? c th? nhanh chng ni qu v? ?ang c ???ng huy?t th?p (h? ???ng huy?t) hay ???ng huy?t cao (t?ng ???ng huy?t).  Gip qu v? v chuyn gia ch?m [place] s?c kh?e ?i?u ch?nh thu?c c?a qu v? n?u c?n thi?t. KHI NO TI NN KI?M TRA ???NG HUY?T? Tun th? ch? d?n c?a chuyn gia ch?m  s?c kh?e v? t?n su?t ki?m tra ???ng huy?t. ?i?u na?y c th? phu? thu?c va?o:  Lo?i ti?u ???ng c?a qu v?.  B?nh ti?u ???ng c?a qu v?  ???c ki?m sot t?t nh? th? no.  Thu?c qu v? ?ang dng. N?u qu v? b? ti?u ???ng tup 1:  Ki?m tra ???ng huy?t t nh?t 2 l?n m?i ngy.  C?ng ki?m tra ???ng huy?t c?a qu v?: ? Tr??c m?i l?n tim insulin. ? Tr??c v sau khi t?p th? d?c. ? Gi?a cc b?a ?n. ? 2 gi? sau b?a ?n. ? Th?nh tho?ng t? 2:00 sng ??n 3:00 sng, theo ch? d?n. ? Tr??c khi th?c hi?n nh?ng nhi?m v? ti?m ?n nguy hi?m, nh? li xe ho?c v?n hnh my mc h?ng n?ng. ? Lc ?i ng?.  Qu v? c th? c?n ki?m tra ???ng huy?t th??ng xuyn h?n, ln t?i 6-10 l?n m?t ngy: ? N?u quy? vi? s? d?ng m?t my tim insulin. ? N?u quy? vi? c?n tim nhi?u l?n m?i ngy (MDI). ? N?u b?nh ti?u ???ng c?a qu v? khng ???c ki?m sot t?t. ? N?u quy? vi? ?ang b? b?nh. ? N?u qu v? c ti?n s? b? h? ???ng huy?t n?ng. ? N?u qu v? c ti?n s? khng bi?t khi no ???ng huy?t c?a mnh s? b? h? (h? ???ng huy?t v th?c). N?u qu v? b? ti?u ???ng tup 2:  N?u qu v? tim insulin ho?c dng cc  thu?c tr? ti?u ???ng khc, hy ki?m tra ???ng huy?t t nh?t 2 l?n m?i ngy.  N?u qu v? ?ang p d?ng li?u php insulin tch c?c, hy ki?m tra ???ng huy?t c?a qu v? t nh?t 4 l?n m?i ngy. Th?nh tho?ng, qu v? c?ng c th? c?n ki?m tra trong kho?ng th?i gian t? 2:00 sng ??n 3:00 gi? sng, theo h??ng d?n.  C?ng ki?m tra ???ng huy?t c?a qu v?: ? Tr??c v sau khi t?p th? d?c. ? Tr??c khi th?c hi?n nh?ng nhi?m v? ti?m ?n nguy hi?m, nh? li xe ho?c v?n hnh my mc h?ng n?ng.  Qu v? c th? c?n ki?m tra ???ng huy?t th??ng xuyn h?n, n?u: ? Thu?c c?a qu v? ???c ?i?u ch?nh. ? B?nh ti?u ???ng c?a qu v? khng ???c ki?m sot t?t. ? Qu v? ?ang b? ?m. NH?T K ???NG HUY?T L G?  Nh?t k ???ng huy?t l ghi chp v? cc ch? s? ???ng huy?t c?a qu v?. Cu?n nh?t k ? s? gip qu v? v chuyn gia ch?m [place] s?c kh?e c?a qu v?: ? Xem bi?u ?? ???ng huy?t c?a qu v? theo th?i gian. ? ?i?u ch?nh k? ho?ch qu?n l ti?u ???ng n?u c?n thi?t.  M?i l?n qu v? ki?m tra ???ng  huy?t, hy vi?t ra k?t qu? v ghi ch v? nh?ng th? c th? ?nh h??ng ??n ???ng huy?t c?a qu v?, ch?ng h?n nh? ch? ?? ?n v bi t?p cho ngy hm ?Marland Kitchen  H?u h?t cc my ?o ???ng huy?t ??u l?u tr? h? s? v? ch? s? ???ng huy?t trong my. M?t s? my ?o cn cho php qu v? t?i h? s? v? my vi tnh.  TI KI?M TRA ???NG HUY?T C?A MNH NH? TH? NO? Lm theo cc b??c sau ?y ?? nh?n ch? s? ???ng huy?t chnh xc: V?t d?ng c?n thi?t  My ?o ???ng huy?t.  Que th? ?? ?o ???ng huy?t c?a qu v?. M?i my ?o c cc que th? ring. Qu v? ph?i s? d?ng cc que th? ?i km my ?o c?a mnh.  M?t m?i kim ?? chm vo ngn tay (m?i chch). Khng s? d?ng m?i chch qu m?t l?n.  D?ng c? gi? m?i trch (d?ng c? trch).  M?t cu?n nh?t k ghi l?i k?t qu? c?a qu v?. Th? thu?t  R?a tay qu v? b?ng x phng v n??c.  Chm m?i chch vo ph?n bn c?nh c?a ngn tay (khng ph?i ??u ngn tay). M?i l?n s? d?ng m?t ngn tay khc nhau.  N?n nh? nhng ngn tay cho t?i khi m?t gi?t mu nh? xu?t hi?n.  Lm theo cc h??ng d?n km theo my ?o c?a qu v? ?? gi que th?, bi mu vo que th? v dng my ?o ???ng huy?t c?a qu v?.  Vi?t k?t qu? v b?t k? ghi ch no. Cc v? tr xt nghi?m thay th?  M?t s? my ?o cho php qu v? s? d?ng cc vng c? th? ngoi ngn tay (cc v? tr thay th?) ?? xt nghi?m mu.  N?u qu v? ngh? l c th? b? h? ???ng huy?t, ho?c n?u qu v? b? h? ???ng huy?t v th?c, khng s? d?ng cc v? tr thay th?. Thay vo ?, hy s? d?ng ngn tay.  Cc v? tr thay th? c th? khng chnh xc nh? cc ngn tay, b?i v dng mu ? nh?ng khu v?c ny ch?y ch?m h?n. ?i?u ny c ngh?a r?ng k?t qu? qu v? nh?n ???c c  th? b? ch?m tr? v n c th? khc so v?i k?t qu? qu v? nh?n ???c t? xt nghi?m ? ngn tay qu v?.  Nh?ng v? tr thay th? ph? bi?n nh?t l: ? C?ng tay. ? ?i. ? Lng bn tay. Cc ??u mt khc  Lun lun mang theo cc v?t d?ng cung c?p c?a qu v?.  T?t c? my ?o ???ng huy?t ??u c m?t s? ?i?n tho?i "???ng dy  nng" tr?c 24 gi? ?? g?i n?u qu v? c cu h?i ho?c c?n tr? gip. Quy? vi? c?ng co? th? lin h? v?i chuyn gia ch?m so?c s??c kho?e c?a mnh.  Sau khi qu v? s? d?ng m?t vi h?p que th?, hy ?i?u ch?nh (hi?u ch?nh) my ?o ???ng huy?t theo h??ng d?n ?i km theo my. Thng tin ny khng nh?m m?c ?ch thay th? cho l?i khuyn m chuyn gia ch?m [place] s?c kh?e ni v?i qu v?. Hy b?o ??m qu v? ph?i th?o lu?n b?t k? v?n ?? g m qu v? c v?i chuyn gia ch?m [place] s?c kh?e c?a qu v?. Document Released: 04/14/2015 Document Reviewed: 08/28/2015 Elsevier Interactive Patient Education  2017 [place]. B?nh ti?u ???ng v th?c ph?m (Diabetes Mellitus and Food) ?i?u quan tro?ng la? quy? vi? pha?i ki?m sot l??ng ???ng (glucose) trong ma?u. L???ng ???ng trong mu c?a quy? vi? c th? b? ?nh h??ng r?t nhi?u b?i nh?ng g quy? vi? ?n. Vi?c ?n u?ng ca?c th?c ph?m la?nh ma?nh v??i s? l???ng thch h?p trong ngy va?o cng th?i gian m?i ngy s? gip quy? vi? ki?m sot l???ng glucose trong mu c?a quy? vi?. N c?ng c th? gip lm ch?m ho?c ng?n khng cho b?nh ti?u ???ng n??ng thm. ?n u?ng lnh m?nh th?m ch c th? gip quy? vi? c?i thi?n huy?t p c?a quy? vi? v ??t ???c ho?c duy tr m?t m??c cn n??ng kh?e m?nh. Cc khuy?n ngh? chung cho ?n u?ng v thi quen n?u ?n lnh m?nh bao g?m:  ?n cc b?a ?n v ?? ?n nh? th??ng xuyn. Trnh nhi?n ?n trong th?i gian di ?? gi?m cn.  ?n m?t ch? ?? ?n bao g?m ch? y?u l th??c ph?m co? ngu?n g?c th?c v?t, ch?ng h?n nh? tri cy, rau, h?t, rau ??u v ng? c?c nguyn ha?t.  S? d?ng ca?c ph??ng php n?u ?n nhi?t ?? th?p, ch?ng h?n nh? n??ng, thay v ph??ng php n?u nhi?t ?? cao nh? chin ng?p d?u m??. Lm vi?c v?i cc chuyn gia dinh d??ng c?a quy? vi? ?? ba?o ?a?m quy? vi? hi?u ca?ch s? d?ng thng tin dinh d??ng trn nhn th?c ph?m. TH?C PH?M CO? TH? ?NH H??NG ??N TI NH? TH? NA?O? Carbohydrates Carbohydrate ?nh h??ng ??n m?c ?? ???ng trong mu c?a quy? vi? nhi?u h?n  b?t k? lo?i th?c ph?m no khc. Chuyn gia dinh d??ng c?a quy? vi? s? gip quy? vi? xc ??nh co? th? ?n bao nhiu carbohydrates m?i b?a ?n v h???ng d?n quy? vi? ca?ch ti?nh l???ng carbohydrates. Ti?nh l???ng carbohydrates l vi?c quan tr?ng ?? gi? cho l???ng ???ng trong ma?u cu?a quy? vi? ? m?c phu? h??p, ??c bi?t n?u quy? vi? ?ang du?ng insulin ho?c m?t s? thu?c tri? ti?u ???ng nh?t ?i?nh. R??u R??u c th? gy gi?m ??t ng?t l???ng ???ng trong mu (h? ???ng huy?t), ??c bi?t n?u quy? vi? du?ng insulin ho?c m?t s? lo?i thu?c tri? ti?u ???ng nh?t ?i?nh. H? ???ng huy?t c th? l m?t tnh tr?ng ?e d?a ma?ng s?ng. Cc tri?u  ch?ng c?a h? ???ng huy?t (bu?n ng?, chng m?t v m?t ph??ng h??ng) l t??ng t? nh? cc tri?u ch?ng c?a vi?c u?ng qu nhi?u r??u. N?u chuyn gia ch?m [place] s?c kh?e c?a quy? vi? ch?p thu?n cho quy? vi? u?ng r??u, ha?y u?ng v??a pha?i va? la?m theo cc h???ng d?n sau:  Ph? n? khng nn u?ng nhi?u h?n m?t ly m?i ngy, v ?n ng khng nn u?ng nhi?u h?n hai ly m?i ngy. M?t ly l t??ng ???ng v?i: ? 12 oz bia. ? 5 oz r??u vang. ? 1 oz r??u n??ng.  Khng u?ng r???u khi ?o?i.  Gi? cho c? th? ?u? n???c. U?ng n??c, soda cho ng???i ?n [place], ho?c tr ?a? khng thm ???ng.  [place] thng th???ng, n??c tri cy v ca?c ?? u?ng h?n h??p khc c th? ch?a nhi?u carbohydrate v c?n ???c tnh. NH?NG TH?C ?N NO KHNG ???C KHUY?N NGH?? Khi quy? vi? l?a ch?n th?c ph?m, ?i?u quan tr?ng l ph?i nh? r?ng t?t c? cc lo?i th?c ph?m khng gi?ng nhau. M?t s? lo?i th?c ph?m c i?t ch?t dinh d??ng trong m?t kh?u ph?n h?n so v??i cc th?c ph?m khc, m?c d chu?ng c th? c cng m?t s? l???ng calo ho?c carbohydrate. Kho? c th? ?? c? th? quy? vi? co? ?u? nh?ng ch?t c?n thi?t khi quy? vi? ?n cc th?c ph?m t ch?t dinh d??ng. V d? v? cc lo?i th?c ph?m m quy? vi? nn trnh ma? co? nhi?u calo v carbohydrate nh?ng t ch?t dinh d??ng bao g?m:  Ch?t be?o ba?o ho?a (h?u h?t ca?c th?c ph?m  ch? bi?n ??u li?t k ch?t be?o ba?o ho?a trn nhn thng tin dinh d??ng).  Soda thng th???ng.  N??c p.  K?o.  ?? ng?t, ch?ng h?n nh? bnh, bnh n???ng, bnh rn v ba?nh quy.  Th?c ph?m chin. TI C TH? ?N TH?C ?N G? ?n th?c ph?m giu ch?t dinh d??ng, m s? nui d??ng c? th? quy? vi? v gi? cho quy? vi? kh?e m?nh. Th?c ph?m quy? vi? nn ?n c?ng s? ph? thu?c vo m?t s? y?u t?, bao g?m:  L??ng calo quy? vi? c?n.  Cc lo?i thu?c m quy? vi? du?ng.  Cn n??ng cu?a quy? vi?.  M?c ?? ???ng trong mu c?a quy? vi?.  Huy?t p c?a quy? vi?.  M?c ?? cholesterol c?a quy? vi?. Quy? vi? nn ?n nhi?u lo?i th?c ph?m kha?c nhau, bao g?m:  Protein. ? Thi?t na?c. ? Protein co? i?t ch?t bo bo ha, nh? c, lng tr?ng tr?ng v ??u. Trnh cc lo?i th?t ch? bi?n s??n.  Tri cy v rau. ? Tri cy v rau qu? c th? gip ki?m sot m?c ?? ???ng trong mu, nh? to, xoi v [place] m??.  Ca?c sa?n ph?m s??a. ? Ch?n cc s?n ph?m s??a khng co? ho??c co? i?t ch?t be?o, ch?ng h?n nh? s?a, s??a chua va? pho mt.  Cc lo?i ng? c?c, bnh m, m ?ng v g?o. ? Ch?n s?n ph?m nguyn h?t, ch?ng h?n nh? bnh m la?m t?? nhi?u loa?i ha?t, y?n m?ch nguyn ha?t v g?o l??t. Nh?ng th?c ph?m na?y c th? gip ki?m sot huy?t p.  Ch?t bo. ? Th?c ph?m ch?a ch?t bo lnh m?nh, ch?ng h?n nh? cc lo?i h?t, l t?u, d?u  liu, d?u canola, v c. CO? PHA?I M?I NG??I BI? B?NH TI?U ???NG ??U C K? HO?[place] ?N U?NG NH? NHAU KHNG? B?i v m?i ng??i bi? b?nh ti?u ???ng l khc nhau, nn khng m?t k? ho?ch ?  n u?ng ph h?p cho t?t c? m?i ng??i. ?i?u r?t quan tr?ng la? quy? vi? pha?i g??p chuyn gia dinh d??ng, chuyn gia ? s? gip quy? vi? xy d??ng m?t k? ho?ch ?n u?ng ch? phu? h??p cho quy? vi?. Thng tin ny khng nh?m m?c ?ch thay th? cho l?i khuyn m chuyn gia ch?m [place] s?c kh?e ni v?i qu v?. Hy b?o ??m qu v? ph?i th?o lu?n b?t k? v?n ?? g m qu v? c v?i chuyn gia ch?m [place] s?c kh?e c?a qu  v?. Document Released: 07/10/2015 Document Revised: 07/10/2015 Document Reviewed: 02/12/2013 Elsevier Interactive Patient Education  2017 Elsevier Inc.  B?nh ti?u ???ng v tiu chu?n ch?m [place] y khoa Diabetes Mellitus and Standards of Medical Care Vi?c qu?n l ti?u ???ng (b?nh ti?u ???ng) c th? r?t ph?c t?p. Vi?c ?i?u tr? b?nh ti?u ???ng c?a qu v? c th? do m?t nhm chuyn gia ch?m [place] s?c kh?e qu?n l, bao g?m:  Chuyn gia v? dinh d??ng v ch? ?? ?n (chuyn gia dinh d??ng c gi?y php hnh ngh?).  Y t.  Chuyn gia gio d?c ti?u ???ng ???c ch?ng nh?n (CDE).  Chuyn gia v? ti?u ???ng (bc s? chuyn khoa n?i ti?t).  Bc s? m?t.  Bc s? ch?m [place] chnh.  Zachery Dakins s?.  Cc chuyn gia ch?m [place] s?c kh?e c?a qu v? s? tun th? m?t l?ch trnh ?? gip qu v? c ???c ch?t l??ng ch?m [place] t?t nh?t. L?ch trnh d??i ?y l h??ng d?n chung cho k? ho?ch qu?n l b?nh ti?u ???ng c?a qu v?. Chuyn gia ch?m [place] s?c kh?e c?ng c th? c h??ng d?n c? th? h?n cho qu v?. Xt nghi?m HbA1c ( hemoglobin A1c) Xt nghi?m na?y cung c?p thng tin v? vi?c ki?m sot ???ng huy?t (glucose) trong 2-3 thng tr??c ?. Xt nghi?m ???c s? d?ng ?? ki?m tra xem c c?n ?i?u ch?nh k? ho?ch qu?n l b?nh ti?u ???ng c?a qu v? hay khng.  N?u qu v? ??t m?c tiu ?i?u tr?, xt nghi?m ny s? ???c th?c hi?n t nh?t l 2 l?n m?i n?m.  N?u qu v? khng ??t m?c tiu ?i?u tr? ho?c m?c tiu ?i?u tr? c?a qu v? ? thay ??i, xt nghi?m ny s? ???c th?c hi?n 4 l?n m?i n?m.  Ki?m tra huy?t p  Ki?m tra ny ???c th?c hi?n vo m?i l?n ??n khm s?c kh?e ??nh k?. V?i h?u h?t m?i ng??i, m?c tiu l d??i 130/80. Hy h?i chuyn gia ch?m [place] s?c kh?e c?a qu v? xem huy?t p m?c tiu c?a qu v? nn ? m?c bao nhiu. Ki?m tra r?ng v m?t  ??n khm nha s? c?a qu v? 2 l?n m?i n?m.  N?u qu v? b? b?nh ti?u ???ng tup 1, hy ?i khm m?t sau th?i ?i?m qu v? c ch?n ?on 3-5 n?m v sau ? m?i n?m m?t l?n sau l?n khm ??u tin. ? N?u qu v? ???c ch?n  ?on l b? ti?u ???ng tup 1 lc cn nh?, hy ?i khm m?t khi qu v? t? 10 tu?i tr? ln v ? b? ti?u ???ng trong 3-5 n?m. Sau l?n khm ??u tin, qu v? nn ?i khm m?t m?t l?n m?i n?m.  N?u qu v? b? b?nh ti?u ???ng tup 2, hy ?i khm m?t ngay sau khi qu v? c ch?n ?on v sau ? m?i n?m m?t l?n sau l?n khm ??u tin. Khm ch?m [place] bn chn  Khm bn chn b?ng cch [place]  st ???c th?c hi?n vo m?i l?n khm s?c kh?e ??nh k?. ? l?n khm ny, bc s? s? ki?m tra xem bn chn c?a qu v? c cc v?t c?t, v?t b?m tm, v?t ??, m?n n??c, lot, ho?c cc v?n ?? khc hay khng.  Chuyn gia ch?m [place] s?c kh?e c?a qu v? s? khm bn chn t?ng th? m?t l?n m?i n?m. ? l?n khm ny, bc s? s? khm c?u trc v da bn chn c?a qu v? v ki?m tra cc m?ch mu c?ng c?ng nh? c?m gic ? bn chn c?a qu v?. ? Ti?u ???ng tup 1: ?i khm l?n ??u tin sau th?i ?i?m c ch?n ?on 3-5 n?m. ? Ti?u ???ng tup 2: ?i khm l?n ??u tin ngay sau khi ???c ch?n ?on.  Ki?m tra hng ngy xem bn chn c?a qu v? c cc v?t c?t, v?t b?m tm, v?t ??, m?n n??c, ho?c ch? lot hay khng. N?u qu v? c b?t k? tnh tr?ng no trong s? cc tnh tr?ng ny ho?c g?p ph?i nh?ng v?n ?? khc m khng kh?i, hy lin l?c v?i chuyn gia ch?m  s?c kh?e c?a mnh. Xt nghi?m ch?c n?ng th?n ( xt nghi?m microalbumin ni?u)  Xt nghi?m ny ???c th?c hi?n m?t l?n m?i n?m. ? Ti?u ???ng tup 1: ?i xt nghi?m l?n ??u tin sau th?i ?i?m c ch?n ?on 5 n?m. ? Ti?u ???ng tup 2: Xt nghi?m l?n ??u tin ngay sau khi c ch?n ?on.  N?u qu v? b? b?nh th?n m?n tnh (CKD), hy ki?m tra t?c ?? l?c c?u th?n ??c tnh (eGFR) v creatinine huy?t thanh m?t l?n m?i n?m. B? m? mu (cholesterol, HDL, LDL, ch?t bo trung tnh)  Nn th?c hi?n xt nghi?m ny khi qu v? ???c ch?n ?on b? ti?u ???ng v 5 n?m m?t l?n sau l?n xt nghi?m ??u tin. N?u qu v? ?ang dng thu?c ?? lm gi?m cholesterol, qu v? c th? c?n lm xt nghi?m ny hng n?m. ? M?c tiu ??i v?i LDL l d??i 100  mg/dL (5,5 mmol/L). N?u qu v? c nguy c? cao, m?c tiu l d??i 70 mg/dL (3,9 mmol/L). ? M?c tiu ??i v?i HDL l 40 mg/dL (2,2 mmol/L) ??i v?i nam v 50 mg/dL(2,8 mmol/L) ??i v?i n?. M?c cholesterol HDL t? 60 mg/dL (3,3 mmol/L) tr? ln c m?t s? tc d?ng b?o v? ch?ng l?i b?nh tim. ? M?c tiu ??i v?i triglycerides l d??i 150 mg/dL (8,3 mmol/L). Ch?ng ng?a  T?t c? nh?ng ai t? 6 thng tu?i tr? ln b? ti?u ???ng ??u nn ???c ch?ng ng?a v?c-xin cm (b?nh cm) hng n?m.  T?t c? nh?ng ai t? 2 tu?i tr? ln b? ti?u ???ng ??u nn ???c ch?ng ng?a v?c-xin vim ph?i (ph? c?u khu?n). N?u qu v? t? 67 tu?i tr? ln, qu v? c th? ???c ch?ng ng?a v?c-xin vim ph?i v?i hai m?i ring bi?t.  Ng??i tr??ng thnh nn ???c ch?ng ng?a v?c-xin vim gan B ngay sau khi ???c ch?n ?on b? ti?u ???ng.  Nn ch?ng ng?a v?c-xin Tdap (u?n vn, b?ch h?u v ho g): ? Theo l?ch tim ch?ng thng th??ng c?a tr? em, ??i v?i tr? em. ? 10 n?m m?t l?n, ??i v?i ng??i tr??ng thnh b? ti?u ???ng.  Nh?ng ng??i b? b?nh th?y ??u ho?c t? 52 tu?i tr? ln nn ???c ch?ng ng?a v?c-xin b?nh zona. S?c kh?e tm th?n v c?m xc  Nn t?m sot cc tri?u ch?ng r?i lo?n ?n u?ng, lo l?ng v tr?m  c?m vo th?i gian ch?n ?on ho?c sau ? n?u c?n thi?t. N?u t?m sot ny cho th?y r?ng qu v? c cc tri?u ch?ng (qu v? c k?t qu? t?m sot d??ng tnh), qu v? c th? c?n ???c ?nh gi thm v ???c gi?i thi?u ??n chuyn gia ch?m [place] s?c kh?e tm th?n. H??ng d?n t? qu?n l b?nh ti?u ???ng  Nn h??ng d?n v? cch qu?n l b?nh ti?u ???ng c?a qu v? t?i th?i ?i?m c ch?n ?on v ti?p t?c n?u c?n thi?t. K? ho?ch ?i?u tr?  K? ho?ch ?i?u tr? c?a qu v? s? ???c xem xt l?i vo m?i l?n khm. Tm t?t  Vi?c qu?n l ti?u ???ng (b?nh ti?u ???ng) c th? r?t ph?c t?p. Vi?c ?i?u tr? b?nh ti?u ???ng c?a qu v? c th? do m?t nhm chuyn gia ch?m [place] s?c kh?e qu?n l.  Cc chuyn gia ch?m [place] s?c kh?e c?a qu v? s? tun th? m?t l?ch trnh ?? gip qu v? c ???c ch?t l??ng ch?m  [place] t?t nh?t.  Tiu chu?n ch?m [place] bao g?m ki?m tra lm sng ??nh k?, xt nghi?m mu, theo di huy?t p, ch?ng ng?a, xt nghi?m sng l?c v h??ng d?n v? cch qu?n l b?nh ti?u ???ng c?a qu v?.  Chuyn gia ch?m [place] s?c kh?e c?ng c th? c h??ng d?n c? th? h?n cho qu v? c?n c? vo tnh tr?ng s?c kh?e c?a c nhn qu v?. Thng tin ny khng nh?m m?c ?ch thay th? cho l?i khuyn m chuyn gia ch?m [place] s?c kh?e ni v?i qu v?. Hy b?o ??m qu v? ph?i th?o lu?n b?t k? v?n ?? g m qu v? c v?i chuyn gia ch?m  s?c kh?e c?a qu v?. Document Released: 02/28/2016 Document Revised: 02/28/2016 Elsevier Interactive Patient Education  [employer].  Ti?u ???ng tup 2, Ch?n ?oa?n, Ng??i l?n (Type 2 Diabetes Mellitus, Diagnosis, Adult) Ti?u ????ng tup 2 (b?nh ti?u ????ng tup 2) la? m?t b?nh ke?o da?i (ma?n ti?nh). ?? b?nh ti?u ????ng tup 2, m?t ho??c ca? hai v?n ?? sau co? th? xa?y ra:  Tuy?n tu?y khng ta?o ra ?u? m?t lo?i hc mn c tn la? insulin.  Ca?c t? ba?o trong c? th? khng ?a?p ??ng thch h??p v??i insulin ma? c? th? quy? vi? ta?o ra (kha?ng insulin). Thng th???ng, insulin cho phe?p ????ng trong mu (glucose) ?i va?o va?o t? ba?o trong c? th?. Nh??ng t? ba?o na?y s?? du?ng glucose ?? ta?o ra n?ng l???ng. Ti?nh tra?ng kha?ng insulin ho??c thi?u h?t insulin lm cho ???ng d? th?a tch t? trong mu thay v ?i vo trong cc t? bo. H?u qu? l l??ng ???ng trong mu cao (t?ng ???ng huy?t). ?I?U GI? LA?M T?NG NGUY C?? Nh?ng y?u t? sau c th? lm qu v? d? b? ti?u ???ng tup 2 h?n:  C m?t ng??i trong gia ?nh b? ti?u ???ng tup 2.  Th?a cn ho?c bo ph.  Co? l?i s?ng khng v?n ??ng (ti?nh ta?i).  ? ???c ch?n ?on khng insulin.  Co? ti?n s?? bi? ti?n ti?u ????ng, ti?u ????ng lc mang thai, ho??c h?i ch??ng bu?ng ch??ng ?a nang (PCOS).  La? ng???i co? ngu?n g?c My?-?n, My?-Phi, Hispanic/Latino, ho??c chu A?/?a?o Tha?i bi?nh d??ng. NH?NG D?U HI?U  HO??C TRI?U CH?NG L G? Trong giai ?oa?n s??m cu?a b?nh na?y, qu v? co? th? khng co? tri?u ch??ng. Cc tri?u ch?ng pht tri?n ch?m v c th? bao g?m:  Kht n??c nhi?u (ch?ng kht nhi?u).  ?o?i  t?ng ln(ch??ng ?n nhi?u).  Ti?u ti?n nhi?u (?a ni?u).  ?i ti?u nhi?u vo ban ?m (ti?u ?m).  S?t cn khng r nguyn nhn.  Nhi?m tru?ng th???ng xuyn ma? th???ng bi? ti la?i (ta?i pha?t).  M?t m?i.  Y?u.  Thay ??i th? l?c, ch?ng h?n nh? nhn m?.  V?t c?t ho?c v?t b?m tm lu lnh.  ?au bu?t ho?c t ? bn tay ho?c bn chn.  Cc m?ng ?en trn da (ch?ng gai ?en). B?NH NA?Y ????C CH?N ?OA?N TH? NA?O? B?nh ny ???c ch?n ?on d?a vo cc tri?u ch?ng, b?nh s?? cu?a quy? vi?, kha?m th??c th? v l???ng ????ng huy?t cu?a quy? vi?. ???ng huy?t c th? ???c ki?m tra b?ng m?t ho?c nhi?u xt nghi?m mu sau ?y:  Xt nghi?m ???ng huy?t lc ?i (fasting blood glucose, FBG). Qu v? s? khng ???c php ?n (quy? vi? se? nhi?n ?o?i) trong t nh?t l 8 ti?ng tr??c khi l?y m?u mu.  Xt nghi?m ???ng huy?t ng?u nhin. Xe?t nghi?m na?y ki?m tra ???ng huy?t c?a qu v? b?t k? lc no trong ngy, b?t k? qu v? ?n lc no.  M?t xe?t nghi?m ma?u A1c (hemoglobin A1c). Xt nghi?m na?y cung c?p thng tin v? vi?c ki?m sot ???ng huy?t trong 2-3 thng tr??c ?.  M?t xt nghi?m dung n?p glucose theo ???ng u?ng (OGTT). Xe?t nghi?m na?y ?o l???ng ????ng huy?t cu?a quy? vi? ta?i hai th??i ?i?m: ? Sau khi nh?n ?i. ?y l l??ng ???ng huy?t c? s? c?a qu v?. ? Hai gi?? sau khi u?ng m?t ?? u?ng co? ch??a glucose. Quy? vi? co? th? ????c ch?n ?oa?n bi? ti?u ????ng tup 2 n?u:  L???ng FBG cu?a quy? vi? t? 126 mg/dL (7,0 mmol/L) tr? ln.  L???ng ????ng huy?t ng?u nhin t? 200 mg/dL (11,1 mmol/L) tr? ln.  L???ng A1c cu?a quy? vi? t? 6,5% tr? ln.  K?t qu? OGGT c?a qu v? trn 200 mg/dL (11,1 mmol/L). Nh??ng xe?t nghi?m ma?u na?y co? th? ????c l??p la?i ?? xa?c ??nh ch?n ?oa?n cu?a quy?  vi?. B?NH [place] ????C ?I?U TRI? NH? TH? NA?O? Vi?c ?i?u tri? cu?a quy? vi? co? th? do m?t chuyn gia go?i la? ba?c si? chuyn khoa n?i ti?t qua?n ly?[place] B?nh ti?u ????ng tup 2 co? th? ????c ?i?u tr? b??ng ca?ch tun th? theo ch? d?n c?a chuyn gia ch?m [place] s?c kh?e v?:  Thay ??i ch? ?? ?n va? l?i s?ng. Vi?c ny c th? bao g?m: ? Tun theo m?t k? hoa?ch dinh d???ng ring do m?t chuyn gia v? ch? ?? ?n va? dinh d???ng (chuyn gia dinh d???ng ????c ??ng ky? ha?nh ngh?) xy d??ng. ? T?p th? d?c th??ng xuyn. ? Tm cc cch qu?n l c?ng th?ng.  Ki?m tra l??ng ????ng huy?t cu?a quy? vi? th???ng xuyn theo chi? d?n.  Du?ng thu?c ?i?u tri? ti?u ????ng ho??c insulin hng nga?y. Vi?c na?y giu?p gi?? l???ng ????ng huy?t cu?a quy? vi? n??m trong pha?m vi an toa?n. ? N?u quy? vi? du?ng insulin, quy? vi? c th? c?n ?i?u chi?nh li?u ty thu?c m??c ?? hoa?t ??ng th? ch?t cu?a quy? vi? va? loa?i th??c ?n ma? quy? vi? ?n. Chuyn gia ch?m [place] s?c kh?e s? ni cho qu v? cch ?i?u ch?nh li?u l??ng.  Du?ng thu?c ?? giu?p tra?nh cc bi?n ch??ng cu?a ti?u ????ng, ch??ng ha?n nh?: ? Aspirin. ? Thu?c gia?m cholesterol. ? Thu?c ki?m sot huy?t p. Chuyn gia ch?m so?c s??c kho?e cu?a quy? vi? co? th? ???t ca?c mu?c tiu ?i?u  tri? ring cho quy? vi?. Mu?c tiu ?i?u tri? se? d??a va?o tu?i cu?a quy? vi?, ca?c ti?nh tra?ng b?nh ly? cu?a quy? vi? va? vi?c quy? vi? ?a?p ??ng nh? th? na?o v??i vi?c ?i?u tri? ti?u ????ng. Nhn chung, mu?c tiu ?i?u tri? la? duy tri? l???ng glucose trong ma?u nh? sau:  Tr???c b??a ?n (tr???c khi ?n): 80-130 mg/dL (4,4-7,2 mmol/L).  Sau b??a ?n (sau khi ?n): d???i 180 mg/dL (10 mmol/L).  L???ng A1c: th?p h?n 7%. TUN THU? NH??NG H???NG D?N NA?Y ?? NHA?: Ca?c cu h?i ??t ra v?i chuyn gia ch?m [place] s?c kh?e c?a qu v? Cn nh??c ho?i nh??ng cu sau:  Ti co? c?n g??p m?t chuyn gia h???ng d?n v? b?nh ti?u ????ng khng?  Ti co?  th? ti?m m?t nho?m h? tr?? ng???i bi? ti?u ????ng ?? ?u?  Ti se? c?n thi?t bi? gi? ?? qua?n ly? b?nh ti?u ????ng ?? nha??  Ti c?n loa?i thu?c no ?i?u tri? ti?u ????ng va? khi na?o ti c?n du?ng cc thu?c ??  Ti c?n ki?m tra ????ng huy?t bao lu m?t l?n?  Ti co? th? go?i cho s? na?o n?u ti co? th?c m?c?  Cu?c he?n ti?p theo cu?a ti la? khi na?o? H??ng d?n chung  Ch? s? d?ng thu?c khng c?n k ??n v thu?c c?n k ??n theo ch? d?n c?a chuyn gia ch?m [place] s?c kh?e.  Tun th? t?t c? cc cu?c h?n khm l?i theo ch? d?n c?a chuyn gia ch?m [place] s?c kh?e. ?i?u ny c vai tr quan tr?ng.  ?? bi?t thm thng tin v? b?nh ti?u ????ng, ha?y truy c?p: ? Hi?p h?i Ti?u ????ng My? (ADA): [email or web address] ? Hi?p h?i Chuyn gia H???ng d?n v? Ti?u ????ng cu?a My? (AADE): [email or web address] HA?Y LIN LA?C V??I CHUYN GIA CH?M SO?C S??C KHO?E N?U:  L??ng ???ng huy?t c?a qu v? t? 240 mg/dL (13,3 mmol/L) tr? ln trong 2 nga?y lin tu?c.  Qu v? ? b? ?m ho?c b? s?t trong i?t nh?t 2 ngy v khng ??.  Quy? vi? co? b?t ky? v?n ?? na?o sau ?y trong h?n 6 gi??: ? Qu v? khng th? ?n ho?c u?ng. ? Qu v? b? bu?n nn v nn m?a. ? Qu v? b? tiu ch?y.  YU C?U TR? GIP NGAY L?P T?C N?U:  L???ng ????ng huy?t cu?a quy? vi? d???i 54 mg/dL (3,0 mmol/L).  Quy? vi? b? lu? l?n ho??c quy? vi? kho? suy nghi? ro? ra?ng.  Qu v? b? kh th?.  Qu v? c l??ng [place] trong n???c ti?u ? m?c trung bnh ho??c cao.  Thng tin ny khng nh?m m?c ?ch thay th? cho l?i khuyn m chuyn gia ch?m [place] s?c kh?e ni v?i qu v?. Hy b?o ??m qu v? ph?i th?o lu?n b?t k? v?n ?? g m qu v? c v?i chuyn gia ch?m  s?c kh?e c?a qu v?. Document Released: 03/18/2005 Document Revised: 07/10/2015 Document Reviewed: 04/21/2015 Elsevier Interactive Patient Education  2017 [employer].

## 2016-09-01 LAB — COMPREHENSIVE METABOLIC PANEL
A/G RATIO: 1.8 (ref 1.2–2.2)
ALBUMIN: 5 g/dL — AB (ref 3.6–4.8)
ALK PHOS: 67 IU/L (ref 39–117)
ALT: 18 IU/L (ref 0–32)
AST: 17 IU/L (ref 0–40)
BUN/Creatinine Ratio: 20 (ref 12–28)
BUN: 16 mg/dL (ref 8–27)
Bilirubin Total: 0.6 mg/dL (ref 0.0–1.2)
CO2: 22 mmol/L (ref 18–29)
CREATININE: 0.79 mg/dL (ref 0.57–1.00)
Calcium: 10.1 mg/dL (ref 8.7–10.3)
Chloride: 102 mmol/L (ref 96–106)
GFR calc Af Amer: 92 mL/min/{1.73_m2} (ref >59)
GFR calc non Af Amer: 80 mL/min/{1.73_m2} (ref >59)
GLOBULIN, TOTAL: 2.8 g/dL (ref 1.5–4.5)
Glucose: 119 mg/dL — ABNORMAL HIGH (ref 65–99)
POTASSIUM: 4.5 mmol/L (ref 3.5–5.2)
SODIUM: 142 mmol/L (ref 134–144)
Total Protein: 7.8 g/dL (ref 6.0–8.5)

## 2016-09-01 LAB — CBC
HEMATOCRIT: 39.2 % (ref 34.0–46.6)
HEMOGLOBIN: 13.1 g/dL (ref 11.1–15.9)
MCH: 28.8 pg (ref 26.6–33.0)
MCHC: 33.4 g/dL (ref 31.5–35.7)
MCV: 86 fL (ref 79–97)
Platelets: 350 10*3/uL (ref 150–379)
RBC: 4.55 x10E6/uL (ref 3.77–5.28)
RDW: 13 % (ref 12.3–15.4)
WBC: 6.9 10*3/uL (ref 3.4–10.8)

## 2016-09-01 LAB — LIPID PANEL
CHOLESTEROL TOTAL: 239 mg/dL — AB (ref 100–199)
Chol/HDL Ratio: 5.6 ratio — ABNORMAL HIGH (ref 0.0–4.4)
HDL: 43 mg/dL (ref >39)
LDL CALC: 129 mg/dL — AB (ref 0–99)
Triglycerides: 337 mg/dL — ABNORMAL HIGH (ref 0–149)
VLDL Cholesterol Cal: 67 mg/dL — ABNORMAL HIGH (ref 5–40)

## 2016-09-01 LAB — TSH: TSH: 0.109 u[IU]/mL — ABNORMAL LOW (ref 0.450–4.500)

## 2016-09-02 MED ORDER — SYNTHROID 100 MCG PO TABS: 100.0000 ug | ORAL_TABLET | Freq: Every day | ORAL | 0 refills | Status: DC

## 2016-09-02 NOTE — Progress Notes (Signed)
No show. Pt rescheduled and was seen the following day.  This encounter was created in error - please disregard.

## 2016-09-05 ENCOUNTER — Ambulatory Visit: Payer: BLUE CROSS/BLUE SHIELD | Admitting: Family Medicine

## 2016-09-07 ENCOUNTER — Encounter: Payer: Self-pay | Admitting: Emergency Medicine

## 2016-09-07 ENCOUNTER — Telehealth: Payer: Self-pay | Admitting: Emergency Medicine

## 2016-09-07 NOTE — Telephone Encounter (Signed)
-----   Message from Sherren MochaEva N Shaw, MD sent at 09/04/2016  1:51 PM EDT ----- Then please try again in several days and if still full then please send a letter with below note.

## 2016-09-14 ENCOUNTER — Other Ambulatory Visit: Payer: Self-pay | Admitting: Family Medicine

## 2016-09-17 ENCOUNTER — Telehealth: Payer: Self-pay | Admitting: Family Medicine

## 2016-09-17 NOTE — Telephone Encounter (Signed)
Pt is leaving at 2pm today for [place].

## 2016-09-17 NOTE — Telephone Encounter (Signed)
Pt left message today on referrals voicemail for refill for Synthroid. Pt [place] today and will be there for 10 days and is down to 4 pills. She said her pharmacy [facility] called requesting the refill but has not heard back. Please advise. Pt callback number is [phone].

## 2016-09-18 NOTE — Telephone Encounter (Signed)
Done

## 2016-10-28 ENCOUNTER — Other Ambulatory Visit: Payer: Self-pay | Admitting: Family Medicine

## 2016-11-15 ENCOUNTER — Other Ambulatory Visit: Payer: Self-pay | Admitting: Family Medicine

## 2016-11-20 NOTE — Telephone Encounter (Signed)
Please call and schedule patient and appt within 1 month with Dr Shaw.  

## 2016-12-06 ENCOUNTER — Encounter: Payer: Self-pay | Admitting: Family Medicine

## 2016-12-06 ENCOUNTER — Ambulatory Visit (INDEPENDENT_AMBULATORY_CARE_PROVIDER_SITE_OTHER): Payer: BLUE CROSS/BLUE SHIELD | Admitting: Family Medicine

## 2016-12-06 VITALS — BP 130/70 | HR 93 | Temp 98.0°F | Resp 18 | Ht 60.0 in | Wt 128.5 lb

## 2016-12-06 DIAGNOSIS — E119 Type 2 diabetes mellitus without complications: Secondary | ICD-10-CM | POA: Diagnosis not present

## 2016-12-06 DIAGNOSIS — E8881 Metabolic syndrome: Secondary | ICD-10-CM

## 2016-12-06 DIAGNOSIS — I10 Essential (primary) hypertension: Secondary | ICD-10-CM | POA: Diagnosis not present

## 2016-12-06 DIAGNOSIS — E039 Hypothyroidism, unspecified: Secondary | ICD-10-CM | POA: Diagnosis not present

## 2016-12-06 DIAGNOSIS — E7800 Pure hypercholesterolemia, unspecified: Secondary | ICD-10-CM

## 2016-12-06 NOTE — Patient Instructions (Signed)
Hypothyroidism Hypothyroidism is a disorder of the thyroid. The thyroid is a large gland that is located in the lower front of the neck. The thyroid releases hormones that control how the body works. With hypothyroidism, the thyroid does not make enough of these hormones. What are the causes? Causes of hypothyroidism may include:  Viral infections.  Pregnancy.  Your own defense system (immune system) attacking your thyroid.  Certain medicines.  Birth defects.  Past radiation treatments to your head or neck.  Past treatment with radioactive iodine.  Past surgical removal of part or all of your thyroid.  Problems with the gland that is located in the center of your brain (pituitary).  What are the signs or symptoms? Signs and symptoms of hypothyroidism may include:  Feeling as though you have no energy (lethargy).  Inability to tolerate cold.  Weight gain that is not explained by a change in diet or exercise habits.  Dry skin.  Coarse hair.  Menstrual irregularity.  Slowing of thought processes.  Constipation.  Sadness or depression.  How is this diagnosed? Your health care provider may diagnose hypothyroidism with blood tests and ultrasound tests. How is this treated? Hypothyroidism is treated with medicine that replaces the hormones that your body does not make. After you begin treatment, it may take several weeks for symptoms to go away. Follow these instructions at home:  Take medicines only as directed by your health care provider.  If you start taking any new medicines, tell your health care provider.  Keep all follow-up visits as directed by your health care provider. This is important. As your condition improves, your dosage needs may change. You will need to have blood tests regularly so that your health care provider can watch your condition. Contact a health care provider if:  Your symptoms do not get better with treatment.  You are taking thyroid  replacement medicine and: ? You sweat excessively. ? You have tremors. ? You feel anxious. ? You lose weight rapidly. ? You cannot tolerate heat. ? You have emotional swings. ? You have diarrhea. ? You feel weak. Get help right away if:  You develop chest pain.  You develop an irregular heartbeat.  You develop a rapid heartbeat. This information is not intended to replace advice given to you by your health care provider. Make sure you discuss any questions you have with your health care provider. Document Released: 03/18/2005 Document Revised: 08/24/2015 Document Reviewed: 08/03/2013 Elsevier Interactive Patient Education  2017 Elsevier Inc.  

## 2016-12-06 NOTE — Progress Notes (Addendum)
Subjective:  By signing my name below, I, Moises Blood, attest that this documentation has been prepared under the direction and in the presence of Delman Cheadle, MD. Electronically Signed: Moises Blood, [place]. 12/06/2016 , 3:33 PM .  Patient was seen in Room 2 .   Patient ID: Diana Gilmore, female    DOB: Jun 25, 1950, 66 y.o.   MRN: [id number] Chief Complaint  Patient presents with  . Medication monitoring    levothyroxine    HPI Diana Gilmore is a 66 y.o. female who presents to Primary Care at [facility] for follow up on her levothyroxine. She declined flu vaccine today. Last meal 1 hour prior (2:30PM).   HTN: Pt's BP runs about 115-126/72-72. Her BP was elevated today but believes due to running between our buildings due to recent change.   Lab Results  Component Value Date   CREATININE 0.79 08/31/2016   BP Readings from Last 3 Encounters:  12/06/16 (!) 170/84  08/31/16 (!) 149/91  12/26/15 122/76    Thyroid Disease: Pt is compliant with synthroid and takes it with water in the morning. She states she feels better and she sleeps fine. Her synthroid was decreased from 130mg to 1057m 3 months ago.   There was miscommunication with her synthroid, as pharmacy refilled her synthroid 11246m instead of the new dose 100m72mPatient has been taking synthroid 112mc35mily. She reports having some dizziness and diaphoresis. She denies weight loss, appetite loss, nausea, or vomiting.   Vision She received an injection in her eyes from her ophthalmologist, and her vision has improved.   DM: Pt didn't know she has DM. She agreed to change her diet to control her blood sugar. Her lipids are at goal.   Lab Results  Component Value Date   HGBA1C 6.5 08/31/2016   HGBA1C 6.5 (H) 12/26/2015    Past Medical History:  Diagnosis Date  . Hypertension   . Thyroid disease    Prior to Admission medications   Medication Sig Start Date End Date Taking? Authorizing Provider  benazepril (LOTENSIN) 5 MG  tablet TAKE 1 TABLET (5 MG TOTAL) BY MOUTH DAILY 12/26/15   Shaw,Shawnee Knapp Blood Glucose Monitoring Suppl (D-CARE GLUCOMETER) w/Device KIT 1 Units by Does not apply route daily. 08/31/16   Shaw,Shawnee Knapp Cholecalciferol (VITAMIN D3) 1000 units CAPS Take by mouth.    [provider]  Multiple Vitamins-Minerals (CENTRUM SILVER 50+WOMEN) TABS Take 1 tablet by mouth.    [provider]  SYNTHROID 112 MCG tablet TAKE 1 TABLET (112 MCG TOTAL) BY MOUTH DAILY BEFORE BREAKFAST. 11/20/16   WeberGale JourneyahDamaris HippoC  valACYclovir (VALTREX) 1000 MG tablet At the first sign of a cold sore, take 2 tabs and then take another 2 tabs 12 hours later. 01/19/15   Shaw,Shawnee Knapp  No Known Allergies  Past Surgical History:  Procedure Laterality Date  . THYROID SURGERY     History reviewed. No pertinent family history. Social History   Social History  . Marital status: Married    Spouse name: N/A  . Number of children: N/A  . Years of education: N/A   Social History Main Topics  . Smoking status: Never Smoker  . Smokeless tobacco: Never Used  . Alcohol use No  . Drug use: No  . Sexual activity: Yes   Other Topics Concern  . None   Social History Narrative  . None   Depression screen [facility]9/09[facility] 08/31/2016 12/26/2015  08/07/2015 07/20/2015  Decreased Interest 0 0 0 0 0  Down, Depressed, Hopeless 0 0 0 0 0  PHQ - 2 Score 0 0 0 0 0  Altered sleeping - 0 - - -  Tired, decreased energy - 0 - - -  Change in appetite - 0 - - -  Feeling bad or failure about yourself  - 0 - - -  Trouble concentrating - 0 - - -  Moving slowly or fidgety/restless - 0 - - -  Suicidal thoughts - 0 - - -  PHQ-9 Score - 0 - - -     Review of Systems  Constitutional: Positive for diaphoresis. Negative for activity change, appetite change, fatigue, fever and unexpected weight change.  Respiratory: Negative for chest tightness and shortness of breath.   Cardiovascular: Negative for chest pain, palpitations  and leg swelling.  Gastrointestinal: Negative for abdominal pain, blood in stool, nausea and vomiting.  Neurological: Positive for dizziness. Negative for syncope, light-headedness and headaches.       Objective:   Physical Exam  Constitutional: She is oriented to person, place, and time. She appears well-developed and well-nourished. No distress.  HENT:  Head: Normocephalic and atraumatic.  Eyes: Pupils are equal, round, and reactive to light. EOM are normal.  Neck: Neck supple. No thyromegaly present.  Cardiovascular: Normal rate.   Murmur heard.  Systolic (right upper sternal border) murmur is present  Pulmonary/Chest: Effort normal and breath sounds normal. No respiratory distress.  Musculoskeletal: Normal range of motion.  Neurological: She is alert and oriented to person, place, and time.  Skin: Skin is warm and dry.  Psychiatric: She has a normal mood and affect. Her behavior is normal.  Nursing note and vitals reviewed.  Diabetic Foot Exam - Simple   Simple Foot Form Visual Inspection No deformities, no ulcerations, no other skin breakdown bilaterally:  Yes Sensation Testing Intact to touch and monofilament testing bilaterally:  Yes Pulse Check Posterior Tibialis and Dorsalis pulse intact bilaterally:  Yes Comments      BP (!) 170/84   Pulse 93   Temp 98 F (36.7 C) (Oral)   Resp 18   Ht 5' (1.524 m)   Wt 128 lb 8 oz (58.3 kg)   SpO2 95%   BMI 25.10 kg/m   [3:30PM] BP recheck in room, (left arm manual): 130/70    Assessment & Plan:   1. Type 2 diabetes mellitus without complication, without long-term current use of insulin ([place]) - pt only had borderline a1c and now improved into pre-DM range through excellent changes in diet and exercise!!! Lab Results  Component Value Date   HGBA1C 6.1 (H) 12/06/2016   HGBA1C 6.5 08/31/2016   HGBA1C 6.5 (H) 12/26/2015     2. Pure hypercholesterolemia - not fasting today - was improved from last yr but not at goal of  <100 - however, even if pt can keep her cbgs in pre-diabetic range w/o meds, would still be indicated to start statin as high trig, glucose intolerance, elevated blood pressure puts pt into METABOLIC SYNDROME X category. So start pravastatin 20 Lab Results  Component Value Date   LDLCALC 129 (H) 08/31/2016   Lab Results  Component Value Date   TRIG 337 (H) 08/31/2016   TRIG 552 (H) 12/26/2015   TRIG 405 (H) 07/20/2015   TRIG 426 (H) 09/05/2014      3. Essential hypertension - under excellent control when checking at home, cont benazepril 5.  4. Acquired hypothyroidism -  needs brand name only - though dose was changed last time, pt was never reached out labs and old dose got refilled through refill protocol in error - tSH still suppressed so do need to proceed with decrease from 112 to 110 - recheck in 3 mos. First dose change in > 2 yrs  5.      + microalbuminuria - already on acei - benazepril, DM and BP well controlled (second read confirmed by outside office)  Recheck in 3 mos with cmp, a1c, and tsh.  Orders Placed This Encounter  Procedures  . Comprehensive metabolic panel  . TSH  . Microalbumin/Creatinine Ratio, Urine  . Hemoglobin A1c  . HM Diabetes Foot Exam     Meds ordered this encounter  Medications  . SYNTHROID 100 MCG tablet    Sig: Take 1 tablet (100 mcg total) by mouth daily before breakfast.    Dispense:  90 tablet    Refill:  1    BRAND NAME ONLY MEDICALLY NECESSARY  . benazepril (LOTENSIN) 5 MG tablet    Sig: TAKE 1 TABLET (5 MG TOTAL) BY MOUTH DAILY    Dispense:  90 tablet    Refill:  3  . pravastatin (PRAVACHOL) 20 MG tablet    Sig: Take 1 tablet (20 mg total) by mouth daily.    Dispense:  90 tablet    Refill:  1    I personally performed the services described in this documentation, which was scribed in my presence. The recorded information has been reviewed and considered, and addended by me as needed.   Delman Cheadle, M.D.  Primary Care at [facility] [address] [place], [place] [address] [phone] phone [phone] fax  12/07/16 11:37 PM

## 2016-12-07 DIAGNOSIS — E8881 Metabolic syndrome: Secondary | ICD-10-CM | POA: Insufficient documentation

## 2016-12-07 LAB — COMPREHENSIVE METABOLIC PANEL
A/G RATIO: 1.5 (ref 1.2–2.2)
ALBUMIN: 4.5 g/dL (ref 3.6–4.8)
ALT: 15 IU/L (ref 0–32)
AST: 18 IU/L (ref 0–40)
Alkaline Phosphatase: 56 IU/L (ref 39–117)
BUN / CREAT RATIO: 19 (ref 12–28)
BUN: 16 mg/dL (ref 8–27)
Bilirubin Total: 0.3 mg/dL (ref 0.0–1.2)
CALCIUM: 9.4 mg/dL (ref 8.7–10.3)
CO2: 21 mmol/L (ref 20–29)
Chloride: 103 mmol/L (ref 96–106)
Creatinine, Ser: 0.85 mg/dL (ref 0.57–1.00)
GFR, EST AFRICAN AMERICAN: 84 mL/min/{1.73_m2} (ref >59)
GFR, EST NON AFRICAN AMERICAN: 73 mL/min/{1.73_m2} (ref >59)
Globulin, Total: 3.1 g/dL (ref 1.5–4.5)
Glucose: 122 mg/dL — ABNORMAL HIGH (ref 65–99)
POTASSIUM: 4 mmol/L (ref 3.5–5.2)
SODIUM: 139 mmol/L (ref 134–144)
TOTAL PROTEIN: 7.6 g/dL (ref 6.0–8.5)

## 2016-12-07 LAB — TSH: TSH: 0.085 u[IU]/mL — ABNORMAL LOW (ref 0.450–4.500)

## 2016-12-07 LAB — MICROALBUMIN / CREATININE URINE RATIO
Creatinine, Urine: 78.5 mg/dL
Microalb/Creat Ratio: 76.3 mg/g creat — ABNORMAL HIGH (ref 0.0–30.0)
Microalbumin, Urine: 59.9 ug/mL

## 2016-12-07 LAB — HEMOGLOBIN A1C
ESTIMATED AVERAGE GLUCOSE: 128 mg/dL
Hgb A1c MFr Bld: 6.1 % — ABNORMAL HIGH (ref 4.8–5.6)

## 2016-12-07 MED ORDER — SYNTHROID 100 MCG PO TABS
100.0000 ug | ORAL_TABLET | Freq: Every day | ORAL | 1 refills | Status: DC
Start: 2016-12-07 — End: 2017-05-31

## 2016-12-07 MED ORDER — BENAZEPRIL HCL 5 MG PO TABS: ORAL_TABLET | ORAL | 3 refills | Status: DC

## 2016-12-07 MED ORDER — PRAVASTATIN SODIUM 20 MG PO TABS: 20.0000 mg | ORAL_TABLET | Freq: Every day | ORAL | 1 refills | Status: DC

## 2016-12-09 ENCOUNTER — Encounter: Payer: Self-pay | Admitting: *Deleted

## 2016-12-10 ENCOUNTER — Telehealth: Payer: Self-pay | Admitting: Family Medicine

## 2016-12-10 NOTE — Telephone Encounter (Signed)
Pt is needing to talk with someone about her medication that she would not give name of she states she just picked it up from pharmacy yesterday and was told on the bottle to take in morning and some else has told her to take in pm and she needs clarification   Language barrier   Best number [phone]

## 2016-12-17 NOTE — Telephone Encounter (Signed)
Mailbox full. Unable to reach patient. Letter sent.

## 2016-12-24 ENCOUNTER — Other Ambulatory Visit: Payer: Self-pay | Admitting: Physician Assistant

## 2017-05-23 ENCOUNTER — Ambulatory Visit: Payer: BLUE CROSS/BLUE SHIELD | Admitting: Urgent Care

## 2017-05-23 ENCOUNTER — Encounter: Payer: Self-pay | Admitting: Family Medicine

## 2017-05-23 ENCOUNTER — Ambulatory Visit: Payer: PRIVATE HEALTH INSURANCE | Admitting: Family Medicine

## 2017-05-23 ENCOUNTER — Other Ambulatory Visit: Payer: Self-pay

## 2017-05-23 VITALS — BP 146/94 | HR 84 | Temp 98.7°F | Resp 16 | Ht 60.24 in | Wt 126.0 lb

## 2017-05-23 DIAGNOSIS — R21 Rash and other nonspecific skin eruption: Secondary | ICD-10-CM | POA: Diagnosis not present

## 2017-05-23 DIAGNOSIS — L853 Xerosis cutis: Secondary | ICD-10-CM

## 2017-05-23 DIAGNOSIS — E079 Disorder of thyroid, unspecified: Secondary | ICD-10-CM

## 2017-05-23 NOTE — Progress Notes (Signed)
2/22/201911:06 AM  Diana Gilmore 1950/05/12, 67 y.o. female [id number]  Chief Complaint  Patient presents with  . Rash    Generalized body rash x 2-3 weeks.  Seems to be worse after work.  Itching is relieved somewhat by an allergy pill.      HPI:   Patient is a 67 y.o. female with past medical history significant for DM2, HTN and hypothyroidism and  who presents today for 2-3 weeks of itchy skin with a faint rash. Worse at night. Benadryl helps for a little while. 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   She otherwise has been on new dose of synthroid, changed in sept 2018  Depression screen [facility] 2/9 05/23/2017 12/06/2016 08/31/2016  Decreased Interest 0 0 0  Down, Depressed, Hopeless 0 0 0  PHQ - 2 Score 0 0 0  Altered sleeping 0 - 0  Tired, decreased energy 0 - 0  Change in appetite 0 - 0  Feeling bad or failure about yourself  0 - 0  Trouble concentrating 0 - 0  Moving slowly or fidgety/restless 0 - 0  Suicidal thoughts 0 - 0  PHQ-9 Score 0 - 0    No Known Allergies  Prior to Admission medications   Medication Sig Start Date End Date Taking? Authorizing Provider  benazepril (LOTENSIN) 5 MG tablet TAKE 1 TABLET (5 MG TOTAL) BY MOUTH DAILY 12/07/16  Yes Shawnee Knapp, MD  Blood Glucose Monitoring Suppl (D-CARE GLUCOMETER) w/Device KIT 1 Units by Does not apply route daily. 08/31/16  Yes Shawnee Knapp, MD  Cholecalciferol (VITAMIN D3) 1000 units CAPS Take by mouth.   Yes [provider]  Multiple Vitamins-Minerals (CENTRUM SILVER 50+WOMEN) TABS Take 1 tablet by mouth.   Yes [provider]  pravastatin (PRAVACHOL) 20 MG tablet Take 1 tablet (20 mg total) by mouth daily. 12/07/16  Yes Shawnee Knapp, MD  SYNTHROID 100 MCG tablet Take 1 tablet (100 mcg total) by mouth daily before breakfast. 12/07/16   Shawnee Knapp, MD  valACYclovir (VALTREX) 1000 MG tablet At the first sign of a cold sore, take 2 tabs and  then take another 2 tabs 12 hours later. 01/19/15   Shawnee Knapp, MD    Past Medical History:  Diagnosis Date  . Hypertension   . Thyroid disease     Past Surgical History:  Procedure Laterality Date  . THYROID SURGERY      Social History   Tobacco Use  . Smoking status: Never Smoker  . Smokeless tobacco: Never Used  Substance Use Topics  . Alcohol use: No    Alcohol/week: 0.0 oz    History reviewed. No pertinent family history.  Review of Systems  Constitutional: Negative for chills and fever.  Respiratory: Negative for cough and shortness of breath.   Cardiovascular: Negative for chest pain, palpitations and leg swelling.  Gastrointestinal: Negative for abdominal pain, nausea and vomiting.  Skin: Positive for itching and rash.     OBJECTIVE:  Blood pressure (!) 146/94, pulse 84, temperature 98.7 F (37.1 C), temperature source Oral, resp. rate 16, height 5' 0.24" (1.53 m), weight 126 lb (57.2 kg), SpO2 97 %.  Physical Exam  Constitutional: She is well-developed, well-nourished, and in no distress.  Skin: Skin is warm and dry. Rash (faint macular erythematous rash over arms, chest, abdomen and back) noted. There is erythema.  Areas of excoriations noted  along inside of arms and neck line    ASSESSMENT and PLAN  1. Rash and nonspecific skin eruption Seems to be related to new laundry product, discussed stoping, using dye/fragance free laundry detergent. Cont with OTC antihistamine (change to once a day formulation) for the next week.   2. Dry skin Discussed routine skin care  3. Thyroid disease Rechecking after dose change, patient will be notified if further changes are needed - TSH  Return in about 4 weeks (around 06/20/2017) for Dr Brigitte Pulse for diabetes.    Rutherford Guys, MD Primary Care at [place], [place] [address] Ph.  [phone] Fax [phone]

## 2017-05-23 NOTE — Patient Instructions (Signed)
1. Change to laundry detergent to dye free/ fragrance free 2. Continue with over the counter once a day allergy medicine for a week 3. Moisturize skin very day, add vaseline to your lotion of choice, pat dry, use warm water not hot water

## 2017-05-24 LAB — TSH: TSH: 1.17 u[IU]/mL (ref 0.450–4.500)

## 2017-05-31 ENCOUNTER — Other Ambulatory Visit: Payer: Self-pay | Admitting: Family Medicine

## 2017-06-02 NOTE — Telephone Encounter (Signed)
LOV with Dr. Clelia CroftShaw on 12/06/2016 / Refill request for synthroid and pravastatin /

## 2017-06-26 ENCOUNTER — Ambulatory Visit: Payer: PRIVATE HEALTH INSURANCE | Admitting: Family Medicine

## 2017-08-26 ENCOUNTER — Other Ambulatory Visit: Payer: Self-pay

## 2017-08-26 ENCOUNTER — Encounter: Payer: Self-pay | Admitting: Family Medicine

## 2017-08-26 ENCOUNTER — Ambulatory Visit: Payer: PRIVATE HEALTH INSURANCE | Admitting: Family Medicine

## 2017-08-26 VITALS — BP 140/88 | HR 105 | Temp 99.0°F | Resp 18 | Ht 60.24 in | Wt 126.0 lb

## 2017-08-26 DIAGNOSIS — E039 Hypothyroidism, unspecified: Secondary | ICD-10-CM | POA: Diagnosis not present

## 2017-08-26 DIAGNOSIS — Z113 Encounter for screening for infections with a predominantly sexual mode of transmission: Secondary | ICD-10-CM | POA: Diagnosis not present

## 2017-08-26 DIAGNOSIS — E119 Type 2 diabetes mellitus without complications: Secondary | ICD-10-CM

## 2017-08-26 DIAGNOSIS — R030 Elevated blood-pressure reading, without diagnosis of hypertension: Secondary | ICD-10-CM | POA: Diagnosis not present

## 2017-08-26 DIAGNOSIS — R040 Epistaxis: Secondary | ICD-10-CM

## 2017-08-26 DIAGNOSIS — Z23 Encounter for immunization: Secondary | ICD-10-CM | POA: Diagnosis not present

## 2017-08-26 DIAGNOSIS — Z114 Encounter for screening for human immunodeficiency virus [HIV]: Secondary | ICD-10-CM | POA: Diagnosis not present

## 2017-08-26 DIAGNOSIS — R21 Rash and other nonspecific skin eruption: Secondary | ICD-10-CM | POA: Diagnosis not present

## 2017-08-26 LAB — POCT CBC
Granulocyte percent: 63.8 %G (ref 37–80)
HCT, POC: 39.3 % (ref 37.7–47.9)
HEMOGLOBIN: 12.73 g/dL (ref 12.2–16.2)
LYMPH, POC: 2.5 (ref 0.6–3.4)
MCH, POC: 28.3 pg (ref 27–31.2)
MCHC: 32.4 g/dL (ref 31.8–35.4)
MCV: 87.4 fL (ref 80–97)
MID (cbc): 0.4 (ref 0–0.9)
MPV: 7.5 fL (ref 0–99.8)
POC Granulocyte: 5 (ref 2–6.9)
POC LYMPH PERCENT: 31.5 %L (ref 10–50)
POC MID %: 4.7 %M (ref 0–12)
Platelet Count, POC: 349 10*3/uL (ref 142–424)
RBC: 4.5 M/uL (ref 4.04–5.48)
RDW, POC: 12.9 %
WBC: 7.9 10*3/uL (ref 4.6–10.2)

## 2017-08-26 LAB — POCT URINALYSIS DIP (MANUAL ENTRY)
Bilirubin, UA: NEGATIVE
Blood, UA: NEGATIVE
Glucose, UA: NEGATIVE mg/dL
Ketones, POC UA: NEGATIVE mg/dL
LEUKOCYTES UA: NEGATIVE
NITRITE UA: NEGATIVE
Spec Grav, UA: 1.015 (ref 1.010–1.025)
UROBILINOGEN UA: 0.2 U/dL
pH, UA: 7.5 (ref 5.0–8.0)

## 2017-08-26 LAB — POCT GLYCOSYLATED HEMOGLOBIN (HGB A1C): HEMOGLOBIN A1C: 6.5 % — AB (ref 4.0–5.6)

## 2017-08-26 MED ORDER — MUPIROCIN CALCIUM 2 % NA OINT: TOPICAL_OINTMENT | NASAL | 0 refills | Status: DC

## 2017-08-26 MED ORDER — LEVOTHYROXINE SODIUM 100 MCG PO TABS: 100.0000 ug | ORAL_TABLET | Freq: Every day | ORAL | 0 refills | Status: DC

## 2017-08-26 MED ORDER — AMMONIUM LACTATE 12 % EX CREA: TOPICAL_CREAM | Freq: Two times a day (BID) | CUTANEOUS | 0 refills | Status: DC

## 2017-08-26 MED ORDER — OXYMETAZOLINE HCL 0.05 % NA SOLN: NASAL | 0 refills | Status: DC

## 2017-08-26 NOTE — Patient Instructions (Addendum)
Start using a COOL MIST HUMIDIFIER NEXT TO YOUR BED every night - you can pick one up at [employer] or [employer] - this will help keep your mucous membrane and skin moist. Increase the amount of water you are drinking to 4-6 bottles a day rather than just 1-2 - drink these in frequent sips throughout the day and stop several hours before bed - it is not as effective if you just chug a bottle all at once. Use nasal saline spray 6 times throughout the day on a routine basis.  To let the scab heal along your septum in your right nare, apply mupirocin twice a day for the next several days. If your nose start rebleeding again, hold pressure along the soft part (below the bony part) and press it together while bending your head foreward between your knees and hold this FOR 5 MINUTES - NO PEEKING or releasing the pressure on your nose during this time - hold this pinch nose and position continuously and after a clock has verified that 5 minutes have passed, then you can let go and immediately put 3 sprays of Afrin (oxymetalozone nasal spray) into your right nare and then repinch for another 5 minutes - continue this - holding for 5 minutes then applying 3 sprays of Afrin immediately after and repinching, then holding continuously until it has resolved.    IF you received an x-ray today, you will receive an invoice from [facility]. Please contact [facility] at [phone] with questions or concerns regarding your invoice.   IF you received labwork today, you will receive an invoice from [place]. Please contact [facility] at [phone] with questions or concerns regarding your invoice.   Our billing staff will not be able to assist you with questions regarding bills from these companies.  You will be contacted with the lab results as soon as they are available. The fastest way to get your results is to activate your My Chart account. Instructions are located on the last page of this paperwork.  If you have not heard from [place] regarding the results in 2 weeks, please contact this office.     Ch?y mu cam, Ng??i l?n (Nosebleed, Adult) Ch?y mu cam l khi mu ch?y ra t? m?i. Ch?y mu cam l tnh tr?ng ph? bi?n. Th??ng th ch?y mu cam khng ph?i l d?u hi?u c?a m?t tnh tr?ng nghim tr?ng. Ch?y mu cam c th? x?y ra n?u m?ch mu nh? trong m?i b?t ??u ch?y mu ho?c n?u nim m?c m?i (mng nh?y) n?t ra. Cc nguyn nhn ph? bi?n c?a ch?y mu cam:  D? ?ng.  C?m l?nh.  Ngoy m?i.  X m?i qu m?nh.  Ch?n th??ng do v?t g ? ?m vo trong m?i hay b? ?nh vo m?i.  Khng kh kh ho?c l?nh. Cc nguyn nhn t ph? bi?n h?n c?a ch?y mu cam bao g?m:  Khi ??c.  C ci g ? b?t th??ng trong m?i ho?c trong cc khoang kh gi?a cc x??ng m?t (xoang).  Kh?i u trong m?i, ch?ng h?n nh? polip.  Cc thu?c hay tnh tr?ng khi?n mu ?ng ch?m.  M?t s? b?nh ho?c th? thu?t gy kch ?ng hay lm kh ???ng m?i. H??NG D?N CH?M [place] T?I NH Khi qu v? b? ch?y mu cam:  Ng?i xu?ng v h?i ng? ??u v? pha tr??c.  Dng kh?n ho?c gi?y l?a s?ch k?p ch?t hai l? m?i bn d??i ph?n c x??ng c?a m?i. Sau 10 pht, nh? tay ra kh?i m?i  v xem mu c b?t ??u ch?y l?i khng. Khng nh? tay p m?i ra tr??c th?i gian ?Marland Kitchen N?u v?n cn ch?y mu, l?p l?i qu trnh k?p hai l? m?i nh? trn v gi? trong vng 10 pht cho ??n khi mu ng?ng ch?y.  Khng nht gi?y l?a hay g?c vo trong m?i ?? ng?n ch?y mu.  Trnh n?m xu?ng v trnh ng? ??u v? pha sau. ? t? th? ?, mu c th? ch?y xu?ng h?ng v gy ?e ho?c ho.  S? d?ng thu?c lm thng m?i d?ng x?t ?? h? tr? tnh tr?ng ch?y mu cam theo s? ch? d?n c?a chuyn gia ch?m [place] s?c kh?e.  Khng bi m? khong hay d?u khong vo trong m?i. Ch?t ny c th? nh? gi?t xu?ng ph?i. Sau khi h?t ch?y mu cam:  Trnh x m?i ho?c kh?t m?i m?nh trong vng vi gi?Marland Kitchen  Trnh ko/??y m?nh, nh?c, hay ci g?p ? vng th?t l?ng trong vi ngy. Qu v? c th? tr? l?i cc ho?t ??ng bnh th??ng khc khi  c th?.  S? d?ng n??c mu?i d?ng x?t ho?c my ?i?u ?m theo s? ch? d?n c?a chuyn gia ch?m [place] s?c kh?e.  Thu?c aspirin v thu?c ch?ng ?ng mu lm ch?y mu d? x?y ra h?n. N?u qu v? ???c k nh?ng lo?i thu?c ny v qu v? b? ch?y mu cam: ? Hy h?i chuyn gia ch?m [place] s?c kh?e xem qu v? c ph?i ng?ng dng nh?ng thu?c ny ho?c c ph?i ?i?u ch?nh li?u dng khng. ? Khng ng?ng dng nh?ng lo?i thu?c m chuyn gia ch?m [place] s?c kh?e khuyn dng tr? khi chuyn gia ch?m [place] s?c kh?e ch? d?n nh? v?y.  N?u ch?y mu cam do nim m?c kh, hy s? d?ng n??c mu?i khng c?n k ??n d?ng x?t m?i ho?c d?ng gel. Vi?c ny gip lm ?m nim m?c v gip lnh l?i. N?u qu v? ph?i s? d?ng ch?t bi tr?n: ? Hy ch?n lo?i c th? tan trong n??c. ? Ch? s? d?ng v?i l??ng c?n thi?t v ch? s? d?ng m?i khi c?n thi?t. ? Khng n?m xu?ng cho ??n khi ? s? d?ng ???c vi gi?. ?I KHM N?U:  Qu v? b? s?t.  Qu v? b? ch?y mu cam th??ng xuyn ho?c th??ng xuyn h?n so v?i bnh th??ng.  Qu v? r?t d? dng b? b?m tm.  Qu v? b? ch?y mu cam do c v?t g ? ?m vo m?i.  Qu v? b? ch?y mu trong mi?ng.  Qu v? nn ho?c ho ra ch?t c m?u nu.  Qu v? b? ch?y mu cam sau khi b?t ??u dng m?t lo?i thu?c m?i. NGAY L?P T?C ?I KHM N?U:  Qu v? b? ch?y mu cam sau khi b? ng ho?c ch?n th??ng ? ??u.  Tnh tr?ng ch?y mu cam c?a qu v? khng h?t sau 20 pht.  Qu v? c?m th?y chng m?t ho?c y?u.  Qu v? b? ch?y mu b?t th??ng t? nh?ng b? ph?n khc c?a c? th?.  Qu v? b? b?m tm b?t th??ng trn nh?ng b? ph?n khc c?a c? th?.  Qu v? v m? hi ??m ?a.  Qu v? nn ra mu. Thng tin ny khng nh?m m?c ?ch thay th? cho l?i khuyn m chuyn gia ch?m [place] s?c kh?e ni v?i qu v?. Hy b?o ??m qu v? ph?i th?o lu?n b?t k? v?n ?? g m qu v? c v?i chuyn gia ch?m  s?c kh?e c?a qu v?. Document Released: 12/26/2004  Document Revised: 04/08/2014 Document Reviewed: 10/03/2015 Elsevier Interactive Patient Education  2018 [employer].  Nosebleed A nosebleed is when blood comes out of the nose. Nosebleeds are common. They are usually not a sign of a serious medical problem. Follow these instructions at home: When you have a nosebleed:  Sit down.  Tilt your head a little forward.  Follow these steps: 1. Pinch your nose with a clean towel or tissue. 2. Keep pinching your nose for 10 minutes. Do not let go. 3. After 10 minutes, let go of your nose. 4. If there is still bleeding, do these steps again. Keep doing these steps until the bleeding stops.  Do not put things in your nose to stop the bleeding.  Try not to lie down or put your head back.  Use a nose spray decongestant as told by your doctor.  Do not use petroleum jelly or mineral oil in your nose. These things can get into your lungs. After a nosebleed:  Try not to blow your nose or sniffle for several hours.  Try not to strain, lift, or bend at the waist for several days.  Use saline spray or a humidifier as told by your doctor.  Aspirin and blood-thinning medicines make bleeding more likely. If you take these medicines, ask your doctor if you should stop taking them, or if you should change how much you take. Do not stop taking the medicine unless your doctor tells you to. Contact a doctor if:  You have a fever.  You get nosebleeds often.  You are getting nosebleeds more often than usual.  You bruise very easily.  You have something stuck in your nose.  You have bleeding in your mouth.  You throw up (vomit) or cough up brown material.  You get a nosebleed after you start a new medicine. Get help right away if:  You have a nosebleed after you fall or hurt your head.  Your nosebleed does not go away after 20 minutes.  You feel dizzy or weak.  You have unusual bleeding from other parts of your body.  You have unusual bruising on other parts of your body.  You get sweaty.  You throw up blood. Summary  Nosebleeds are common.  They are usually not a sign of a serious medical problem.  When you have a nosebleed, sit down and tilt your head a little forward. Pinch your nose with a clean tissue.  After the bleeding stops, try not to blow your nose or sniffle for several hours. This information is not intended to replace advice given to you by your health care provider. Make sure you discuss any questions you have with your health care provider. Document Released: 12/26/2007 Document Revised: 06/28/2016 Document Reviewed: 06/28/2016 Elsevier Interactive Patient Education  2018 [employer].  Dehydration, Adult Dehydration is a condition in which there is not enough fluid or water in the body. This happens when you lose more fluids than you take in. Important organs, such as the kidneys, brain, and heart, cannot function without a proper amount of fluids. Any loss of fluids from the body can lead to dehydration. Dehydration can range from mild to severe. This condition should be treated right away to prevent it from becoming severe. What are the causes? This condition may be caused by:  Vomiting.  Diarrhea.  Excessive sweating, such as from heat exposure or exercise.  Not drinking enough fluid, especially: ? When ill. ? While doing activity that requires a lot  of energy.  Excessive urination.  Fever.  Infection.  Certain medicines, such as medicines that cause the body to lose excess fluid (diuretics).  Inability to access safe drinking water.  Reduced physical ability to get adequate water and food.  What increases the risk? This condition is more likely to develop in people:  Who have a poorly controlled long-term (chronic) illness, such as diabetes, heart disease, or kidney disease.  Who are age 56 or older.  Who are disabled.  Who live in a place with high altitude.  Who play endurance sports.  What are the signs or symptoms? Symptoms of mild dehydration may include:  Thirst.  Dry  lips.  Slightly dry mouth.  Dry, warm skin.  Dizziness. Symptoms of moderate dehydration may include:  Very dry mouth.  Muscle cramps.  Dark urine. Urine may be the color of tea.  Decreased urine production.  Decreased tear production.  Heartbeat that is irregular or faster than normal (palpitations).  Headache.  Light-headedness, especially when you stand up from a sitting position.  Fainting (syncope). Symptoms of severe dehydration may include:  Changes in skin, such as: ? Cold and clammy skin. ? Blotchy (mottled) or pale skin. ? Skin that does not quickly return to normal after being lightly pinched and released (poor skin turgor).  Changes in body fluids, such as: ? Extreme thirst. ? No tear production. ? Inability to sweat when body temperature is high, such as in hot weather. ? Very little urine production.  Changes in vital signs, such as: ? Weak pulse. ? Pulse that is more than 100 beats a minute when sitting still. ? Rapid breathing. ? Low blood pressure.  Other changes, such as: ? Sunken eyes. ? Cold hands and feet. ? Confusion. ? Lack of energy (lethargy). ? Difficulty waking up from sleep. ? Short-term weight loss. ? Unconsciousness. How is this diagnosed? This condition is diagnosed based on your symptoms and a physical exam. Blood and urine tests may be done to help confirm the diagnosis. How is this treated? Treatment for this condition depends on the severity. Mild or moderate dehydration can often be treated at home. Treatment should be started right away. Do not wait until dehydration becomes severe. Severe dehydration is an emergency and it needs to be treated in a hospital. Treatment for mild dehydration may include:  Drinking more fluids.  Replacing salts and minerals in your blood (electrolytes) that you may have lost. Treatment for moderate dehydration may include:  Drinking an oral rehydration solution (ORS). This is a drink  that helps you replace fluids and electrolytes (rehydrate). It can be found at pharmacies and retail stores. Treatment for severe dehydration may include:  Receiving fluids through an IV tube.  Receiving an electrolyte solution through a feeding tube that is passed through your nose and into your stomach (nasogastric tube, or NG tube).  Correcting any abnormalities in electrolytes.  Treating the underlying cause of dehydration. Follow these instructions at home:  If directed by your health care provider, drink an ORS: ? Make an ORS by following instructions on the package. ? Start by drinking small amounts, about  cup (120 mL) every 5-10 minutes. ? Slowly increase how much you drink until you have taken the amount recommended by your health care provider.  Drink enough clear fluid to keep your urine clear or pale yellow. If you were told to drink an ORS, finish the ORS first, then start slowly drinking other clear fluids. Drink fluids such as: ?  Water. Do not drink only water. Doing that can lead to having too little salt (sodium) in the body (hyponatremia). ? Ice chips. ? Fruit juice that you have added water to (diluted fruit juice). ? Low-calorie sports drinks.  Avoid: ? Alcohol. ? Drinks that contain a lot of sugar. These include high-calorie sports drinks, fruit juice that is not diluted, and soda. ? Caffeine. ? Foods that are greasy or contain a lot of fat or sugar.  Take over-the-counter and prescription medicines only as told by your health care provider.  Do not take sodium tablets. This can lead to having too much sodium in the body (hypernatremia).  Eat foods that contain a healthy balance of electrolytes, such as bananas, oranges, potatoes, tomatoes, and spinach.  Keep all follow-up visits as told by your health care provider. This is important. Contact a health care provider if:  You have abdominal pain that: ? Gets worse. ? Stays in one area (localizes).  You  have a rash.  You have a stiff neck.  You are more irritable than usual.  You are sleepier or more difficult to wake up than usual.  You feel weak or dizzy.  You feel very thirsty.  You have urinated only a small amount of very dark urine over 6-8 hours. Get help right away if:  You have symptoms of severe dehydration.  You cannot drink fluids without vomiting.  Your symptoms get worse with treatment.  You have a fever.  You have a severe headache.  You have vomiting or diarrhea that: ? Gets worse. ? Does not go away.  You have blood or green matter (bile) in your vomit.  You have blood in your stool. This may cause stool to look black and tarry.  You have not urinated in 6-8 hours.  You faint.  Your heart rate while sitting still is over 100 beats a minute.  You have trouble breathing. This information is not intended to replace advice given to you by your health care provider. Make sure you discuss any questions you have with your health care provider. Document Released: 03/18/2005 Document Revised: 10/13/2015 Document Reviewed: 05/12/2015 Elsevier Interactive Patient Education  [employer].

## 2017-08-26 NOTE — Progress Notes (Addendum)
Subjective:  By signing my name below, I, Essence Howell, attest that this documentation has been prepared under the direction and in the presence of Delman Cheadle, MD Electronically Signed: Ladene Artist, ED Scribe 08/26/2017 at 12:49 PM.   Patient ID: Diana Gilmore, female    DOB: [birth year], 67 y.o.   MRN: [id number]  Chief Complaint  Patient presents with  . Epistaxis    Pt states she had nose bleed yesterday twice and two this morning. Pt states she called EMS and took her BP and it was 170/92 initial and 158/90 secondary.   HPI Diana Gilmore is a 67 y.o. [place] female who presents to Primary Care at [facility] complaining of epistaxis. Pt states that her nose bleed initially on the R yesterday, twice from the L this morning. She states nosebleeds resolved with using tissues, applying an ice pack to her head and lying down after 10-15 minutes. Pt denies any symptoms prior to onset of epistaxis. States she contacted EMS PTA. Pt does not have a humidifier or use nasal sprays.  Dry Skin Pt reports dry, rash to L forehead that doesn't flake and dry lips. States she has been applying hydrocortisone cream to the rash without significant relief. Also reports that she applies sunscreen often. She is using Zyrtec daily. Pt states that she only drinks 1-2 bottles of water a day. Denies changes to hearing or rash/flaking to eyebrows.  Past Medical History:  Diagnosis Date  . Hypertension   . Thyroid disease    Current Outpatient Medications on File Prior to Visit  Medication Sig Dispense Refill  . benazepril (LOTENSIN) 5 MG tablet TAKE 1 TABLET (5 MG TOTAL) BY MOUTH DAILY 90 tablet 3  . Blood Glucose Monitoring Suppl (D-CARE GLUCOMETER) w/Device KIT 1 Units by Does not apply route daily. 1 kit 0  . Cholecalciferol (VITAMIN D3) 1000 units CAPS Take by mouth.    . Multiple Vitamins-Minerals (CENTRUM SILVER 50+WOMEN) TABS Take 1 tablet by mouth.    . pravastatin (PRAVACHOL) 20 MG tablet TAKE 1 TABLET BY MOUTH  EVERY DAY 90 tablet 1  . SYNTHROID 100 MCG tablet TAKE 1 TABLET (100 MCG TOTAL) BY MOUTH DAILY BEFORE BREAKFAST. 90 tablet 1  . valACYclovir (VALTREX) 1000 MG tablet At the first sign of a cold sore, take 2 tabs and then take another 2 tabs 12 hours later. 20 tablet 2   No current facility-administered medications on file prior to visit.     Past Surgical History:  Procedure Laterality Date  . THYROID SURGERY     No Known Allergies History reviewed. No pertinent family history. Social History   Socioeconomic History  . Marital status: Married    Spouse name: Not on file  . Number of children: Not on file  . Years of education: Not on file  . Highest education level: Not on file  Occupational History  . Not on file  Social Needs  . Financial resource strain: Not on file  . Food insecurity:    Worry: Not on file    Inability: Not on file  . Transportation needs:    Medical: Not on file    Non-medical: Not on file  Tobacco Use  . Smoking status: Never Smoker  . Smokeless tobacco: Never Used  Substance and Sexual Activity  . Alcohol use: No    Alcohol/week: 0.0 oz  . Drug use: No  . Sexual activity: Yes  Lifestyle  . Physical activity:    Days per week: Not  on file    Minutes per session: Not on file  . Stress: Not on file  Relationships  . Social connections:    Talks on phone: Not on file    Gets together: Not on file    Attends religious service: Not on file    Active member of club or organization: Not on file    Attends meetings of clubs or organizations: Not on file    Relationship status: Not on file  Other Topics Concern  . Not on file  Social History Narrative  . Not on file   Depression screen [facility] 2/9 08/26/2017 05/23/2017 12/06/2016 08/31/2016 12/26/2015  Decreased Interest 0 0 0 0 0  Down, Depressed, Hopeless 0 0 0 0 0  PHQ - 2 Score 0 0 0 0 0  Altered sleeping - 0 - 0 -  Tired, decreased energy - 0 - 0 -  Change in appetite - 0 - 0 -  Feeling bad or  failure about yourself  - 0 - 0 -  Trouble concentrating - 0 - 0 -  Moving slowly or fidgety/restless - 0 - 0 -  Suicidal thoughts - 0 - 0 -  PHQ-9 Score - 0 - 0 -     Review of Systems  HENT: Positive for nosebleeds. Negative for hearing loss.   Skin: Positive for rash.  Allergic/Immunologic: Positive for environmental allergies.      Objective:   Physical Exam  Constitutional: She is oriented to person, place, and time. She appears well-developed and well-nourished. No distress.  HENT:  Head: Normocephalic and atraumatic.  Right Ear: Tympanic membrane is injected.  Left Ear: Tympanic membrane normal.  Nose: Nasal septal hematoma present.  R nare: Large septal hematoma on L aspect.  Septal friability and scab. L nare: Septum is normal. Small area of friability along wall just anterior to turbinate. R TM: dark injection, white opacification of central TM with surrounding grey, pearly  Eyes: Conjunctivae and EOM are normal.  Neck: Neck supple. No tracheal deviation present.  Cardiovascular: Normal rate.  Pulmonary/Chest: Effort normal. No respiratory distress.  Musculoskeletal: Normal range of motion.  Neurological: She is alert and oriented to person, place, and time.  Skin: Skin is warm and dry.  Psychiatric: She has a normal mood and affect. Her behavior is normal.  Nursing note and vitals reviewed.  BP 140/88 (BP Location: Left Arm, Patient Position: Sitting, Cuff Size: Normal)   Pulse (!) 105   Temp 99 F (37.2 C) (Oral)   Resp 18   Ht 5' 0.24" (1.53 m)   Wt 126 lb (57.2 kg)   SpO2 97%   BMI 24.41 kg/m     Assessment & Plan:   1. Epistaxis - very dry mucous membranes - currently hemostatic, even with some manipulation and palpation of the large right septal scab with sterile swab to ensure no risk for erosion, infection, or quick rebleed. Discussed ways to increase moisture to MM - see AVS - humidifier, hydrate, nasal saline. Can use mupirocine to nose while large  escar is healing in right septum Discussed how to stop if recurs nml cbc  2. Elevated blood pressure, situational   3. Type 2 diabetes mellitus without complication, without long-term current use of insulin (HCC) -  Lab Results  Component Value Date   HGBA1C 6.5 (A) 08/26/2017   HGBA1C 6.1 (H) 12/06/2016   HGBA1C 6.5 08/31/2016     4. Acquired hypothyroidism - wants to go back to generic levothyroxine  rather than brand name synthroid - feels she was doing better prior - labs today confirm current dose of 118m is good - sent in 1 yr supply  5. Routine screening for STI (sexually transmitted infection)   6. Screening for HIV (human immunodeficiency virus)   7. Facial rash - refer to derm    Orders Placed This Encounter  Procedures  . Tdap vaccine greater than or equal to 7yo IM  . Comprehensive metabolic panel  . TSH+T4F+T3Free  . HIV antibody  . Ambulatory referral to Dermatology    Referral Priority:   Routine    Referral Type:   Consultation    Referral Reason:   Specialty Services Required    Requested Specialty:   Dermatology    Number of Visits Requested:   1  . POCT CBC  . POCT urinalysis dipstick  . POCT glycosylated hemoglobin (Hb A1C)    Meds ordered this encounter  Medications  . oxymetazoline (AFRIN) 0.05 % nasal spray    Sig: IF NOSE BLEED: Put 2 sprays into nose every 5 minutes as needed to get nose bleed to stop    Dispense:  30 mL    Refill:  0  . mupirocin nasal ointment (BACTROBAN) 2 %    Sig: Put one-half of tube into right nostril twice daily for five (5) days very gently using a qtip to apply along central septum.    Dispense:  10 g    Refill:  0  . levothyroxine (SYNTHROID, LEVOTHROID) 100 MCG tablet    Sig: Take 1 tablet (100 mcg total) by mouth daily.    Dispense:  1 tablet    Refill:  0  . ammonium lactate (LAC-HYDRIN) 12 % cream    Sig: Apply topically 2 (two) times daily. To dry skin    Dispense:  385 g    Refill:  0    I personally  performed the services described in this documentation, which was scribed in my presence. The recorded information has been reviewed and considered, and addended by me as needed.   EDelman Cheadle M.D.  [facility] [phone]  09/02/17 10:37 AM

## 2017-08-27 LAB — COMPREHENSIVE METABOLIC PANEL
A/G RATIO: 1.8 (ref 1.2–2.2)
ALT: 18 IU/L (ref 0–32)
AST: 17 IU/L (ref 0–40)
Albumin: 5.1 g/dL — ABNORMAL HIGH (ref 3.6–4.8)
Alkaline Phosphatase: 72 IU/L (ref 39–117)
BILIRUBIN TOTAL: 0.4 mg/dL (ref 0.0–1.2)
BUN/Creatinine Ratio: 32 — ABNORMAL HIGH (ref 12–28)
BUN: 21 mg/dL (ref 8–27)
CALCIUM: 10.6 mg/dL — AB (ref 8.7–10.3)
CHLORIDE: 101 mmol/L (ref 96–106)
CO2: 21 mmol/L (ref 20–29)
Creatinine, Ser: 0.65 mg/dL (ref 0.57–1.00)
GFR calc Af Amer: 109 mL/min/{1.73_m2} (ref >59)
GFR, EST NON AFRICAN AMERICAN: 94 mL/min/{1.73_m2} (ref >59)
Globulin, Total: 2.8 g/dL (ref 1.5–4.5)
Glucose: 137 mg/dL — ABNORMAL HIGH (ref 65–99)
POTASSIUM: 4.4 mmol/L (ref 3.5–5.2)
Sodium: 143 mmol/L (ref 134–144)
Total Protein: 7.9 g/dL (ref 6.0–8.5)

## 2017-08-27 LAB — TSH+T4F+T3FREE
Free T4: 1.54 ng/dL (ref 0.82–1.77)
T3 FREE: 2.3 pg/mL (ref 2.0–4.4)
TSH: 1.36 u[IU]/mL (ref 0.450–4.500)

## 2017-08-27 LAB — HIV ANTIBODY (ROUTINE TESTING W REFLEX): HIV Screen 4th Generation wRfx: NONREACTIVE

## 2017-08-30 ENCOUNTER — Telehealth: Payer: Self-pay | Admitting: Family Medicine

## 2017-08-30 NOTE — Telephone Encounter (Signed)
Pt. Calling to get script of Synthroid, the pt. Asserts they were prescribed one day of the medication when they should have been prescribed three months. Please send to [facility] [address]

## 2017-09-01 ENCOUNTER — Telehealth: Payer: Self-pay | Admitting: Family Medicine

## 2017-09-01 ENCOUNTER — Other Ambulatory Visit: Payer: Self-pay | Admitting: Family Medicine

## 2017-09-01 NOTE — Telephone Encounter (Signed)
Please advise. TSH back from 5/28

## 2017-09-01 NOTE — Telephone Encounter (Signed)
Duplicate message already sent to provider. 

## 2017-09-01 NOTE — Telephone Encounter (Signed)
Copied from CRM [id number]. Topic: Quick Communication - Rx Refill/Question >> Sep 01, 2017  8:19 AM Jay SchlichterWeikart, Melissa J wrote: Medication: levothyroxine (SYNTHROID, LEVOTHROID) 100 MCG tablet Has the patient contacted their pharmacy? Yes.   (Agent: If no, request that the patient contact the pharmacy for the refill.) (Agent: If yes, when and what did the pharmacy advise?)  Preferred Pharmacy (with phone number or street name): [facility] [address]  Status says no print, pharm does not have this   Agent: Please be advised that RX refills may take up to 3 business days. We ask that you follow-up with your pharmacy.

## 2017-09-02 ENCOUNTER — Telehealth: Payer: Self-pay | Admitting: Family Medicine

## 2017-09-02 MED ORDER — LEVOTHYROXINE SODIUM 100 MCG PO TABS: 100.0000 ug | ORAL_TABLET | Freq: Every day | ORAL | 3 refills | Status: DC

## 2017-09-02 NOTE — Progress Notes (Signed)
Please mail letter.  You are right on the line of having diabetes still with your hemoglobin a1c 6.5.  We will recheck these mild elevations in your calcium and albumin at next visit in 6 months but were normal on all prior labs so this is likely just a sampling error or fluke but we will confirm that at your next visit. Your levothyroxine 100mcg is at the right dose and a year's refill has been sent to you pharmacy. You do not have HIV - one time test for everyone. Dr. Clelia CroftShaw

## 2017-09-02 NOTE — Telephone Encounter (Signed)
Pt advised that this is ready for her.

## 2017-09-02 NOTE — Telephone Encounter (Signed)
Pt called back, contact pt if needed

## 2017-09-02 NOTE — Telephone Encounter (Signed)
Pt advised.

## 2017-09-02 NOTE — Telephone Encounter (Signed)
Tried calling pt to let her know Dermatology referral has been sent to [facility] but unable to leave vm due to mailbox being full. If pt calls back, please let her know referral has been sent and she can contact them at [phone].

## 2017-09-02 NOTE — Telephone Encounter (Signed)
Oops!  That was definitely a weird error - very sorry.  Correct refill quant sent in.

## 2017-09-18 ENCOUNTER — Telehealth: Payer: Self-pay | Admitting: Family Medicine

## 2017-09-18 NOTE — Telephone Encounter (Signed)
Pt has been scheduled at [facility] for 11/06/17 at 11:20am with Ginger OrganEnglish Black, PA

## 2017-11-06 DIAGNOSIS — L309 Dermatitis, unspecified: Secondary | ICD-10-CM | POA: Diagnosis not present

## 2017-12-24 ENCOUNTER — Ambulatory Visit: Payer: Medicare Other | Admitting: Family Medicine

## 2017-12-24 ENCOUNTER — Other Ambulatory Visit: Payer: Self-pay

## 2017-12-24 ENCOUNTER — Encounter: Payer: Self-pay | Admitting: Family Medicine

## 2017-12-24 VITALS — BP 146/82 | HR 76 | Temp 98.0°F | Resp 18 | Ht 59.61 in | Wt 129.0 lb

## 2017-12-24 DIAGNOSIS — J4521 Mild intermittent asthma with (acute) exacerbation: Secondary | ICD-10-CM | POA: Diagnosis not present

## 2017-12-24 MED ORDER — AZITHROMYCIN 250 MG PO TABS: ORAL_TABLET | ORAL | 0 refills | Status: DC

## 2017-12-24 MED ORDER — ALBUTEROL SULFATE HFA 108 (90 BASE) MCG/ACT IN AERS: 2.0000 | INHALATION_SPRAY | RESPIRATORY_TRACT | 1 refills | Status: DC | PRN

## 2017-12-24 MED ORDER — PREDNISONE 20 MG PO TABS: ORAL_TABLET | ORAL | 0 refills | Status: DC

## 2017-12-24 MED ORDER — BENZONATATE 100 MG PO CAPS: 100.0000 mg | ORAL_CAPSULE | Freq: Three times a day (TID) | ORAL | 0 refills | Status: DC | PRN

## 2017-12-24 NOTE — Progress Notes (Signed)
Patient ID: Diana Gilmore, female    DOB: 25-Aug-1950  Age: 67 y.o. MRN: [id number]  Chief Complaint  Patient presents with  . Cough    X 2 days    Subjective:   67 year old lady who has been ill for a week.  It started with itching of her ears and throat and congestion.  Now she has a persistent cough.  She is taken a whole bag of cough drops without any relief.  She says she has had fevers.  She does not smoke.  She runs a [employer].  She is planning to travel to [place] in 2 days.  Her husband also has had a respiratory tract infection.  Current allergies, medications, problem list, past/family and social histories reviewed.  Objective:  BP (!) 146/82   Pulse 76   Temp 98 F (36.7 C) (Oral)   Resp 18   Ht 4' 11.61" (1.514 m)   Wt 129 lb (58.5 kg)   SpO2 95%   BMI 25.53 kg/m   Pleasant lady, looks like she does not feel well.  TMs normal.  Throat clear.  Sinuses nontender.  Neck supple without nodes.  Chest has diffuse wheezy rhonchi throughout both lungs.  Heart regular without murmur.  Assessment & Plan:   Assessment: 1. Mild intermittent asthmatic bronchitis with acute exacerbation       Plan: We will treat with inhaled and oral medication.  See instructions.  No orders of the defined types were placed in this encounter.   Meds ordered this encounter  Medications  . predniSONE (DELTASONE) 20 MG tablet    Sig: Take 3 pills daily for 2 days, then 2 daily for 2 days, then 1 daily for 2 days.  Take after breakfast    Dispense:  12 tablet    Refill:  0  . albuterol (PROVENTIL HFA;VENTOLIN HFA) 108 (90 Base) MCG/ACT inhaler    Sig: Inhale 2 puffs into the lungs every 4 (four) hours as needed for wheezing or shortness of breath (cough, shortness of breath or wheezing.).    Dispense:  1 Inhaler    Refill:  1  . benzonatate (TESSALON) 100 MG capsule    Sig: Take 1-2 capsules (100-200 mg total) by mouth 3 (three) times daily as needed.    Dispense:  30 capsule    Refill:   0  . azithromycin (ZITHROMAX) 250 MG tablet    Sig: Take 2 tabs PO x 1 dose, then 1 tab PO QD x 4 days    Dispense:  6 tablet    Refill:  0         Patient Instructions    Drink lots of fluids  Take the azithromycin (Z-Pak) 2 pills today, then 1 every day for the next 4 days for antibiotic  Take the prednisone 20 mg 3 pills today and tomorrow, then 2 pills on Friday and Saturday and one on Sunday and Monday.  These are best taken with food after breakfast, but the first pills you should take today with some food when you get them. If you take them too late in the day they may make you sleepy restless.  Take the benzonatate cough pills 1 or 2 pills 3 times daily  Use the albuterol inhaler 2 inhalations every 4-6 hours as needed for wheezing and coughing  In addition to the above you can take some over-the-counter Mucinex DM if necessary  Return if not improving   If you have lab work done today you  will be contacted with your lab results within the next 2 weeks.  If you have not heard from [place] then please contact us. The fastest way to get your results is to register for My Chart.   IF you received an x-ray today, you will receive an invoice from [facility]. Please contact [facility] at [phone] with questions or concerns regarding your invoice.   IF you received labwork today, you will receive an invoice from [facility]. Please contact [facility] at [phone] with questions or concerns regarding your invoice.   Our billing staff will not be able to assist you with questions regarding bills from these companies.  You will be contacted with the lab results as soon as they are available. The fastest way to get your results is to activate your My Chart account. Instructions are located on the last page of this paperwork. If you have not heard from [place] regarding the results in 2 weeks, please contact this office.    We recommend that you schedule a mammogram  for breast cancer screening. Typically, you do not need a referral to do this. Please contact a local imaging center to schedule your mammogram.  [facility] - [phone]  *ask for the Radiology Department [facility]) - [phone] or [phone]  [facility] - [phone] [facility] - [phone] [facility] - [phone]  *ask for the Radiology Department [facility] - [phone]  *ask for the Radiology Department [facility] - [phone]  *ask for the Mammography Department [facility] - [phone]    Return if symptoms worsen or fail to improve.   Janace Hoard, MD 12/24/2017

## 2017-12-24 NOTE — Patient Instructions (Addendum)
  Drink lots of fluids  Take the azithromycin (Z-Pak) 2 pills today, then 1 every day for the next 4 days for antibiotic  Take the prednisone 20 mg 3 pills today and tomorrow, then 2 pills on Friday and Saturday and one on Sunday and Monday.  These are best taken with food after breakfast, but the first pills you should take today with some food when you get them. If you take them too late in the day they may make you sleepy restless.  Take the benzonatate cough pills 1 or 2 pills 3 times daily  Use the albuterol inhaler 2 inhalations every 4-6 hours as needed for wheezing and coughing  In addition to the above you can take some over-the-counter Mucinex DM if necessary  Return if not improving   If you have lab work done today you will be contacted with your lab results within the next 2 weeks.  If you have not heard from [place] then please contact us. The fastest way to get your results is to register for My Chart.   IF you received an x-ray today, you will receive an invoice from [facility]. Please contact [facility] at [phone] with questions or concerns regarding your invoice.   IF you received labwork today, you will receive an invoice from [facility]. Please contact [facility] at [phone] with questions or concerns regarding your invoice.   Our billing staff will not be able to assist you with questions regarding bills from these companies.  You will be contacted with the lab results as soon as they are available. The fastest way to get your results is to activate your My Chart account. Instructions are located on the last page of this paperwork. If you have not heard from [place] regarding the results in 2 weeks, please contact this office.    We recommend that you schedule a mammogram for breast cancer screening. Typically, you do not need a referral to do this. Please contact a local imaging center to schedule your mammogram.  [facility] - [phone]  *ask for the Radiology Department [facility]) - [phone] or [phone]  [facility] - [phone] [facility] - [phone] [facility] - [phone]  *ask for the Radiology Department [facility] - [phone]  *ask for the Radiology Department [facility] - [phone]  *ask for the Mammography Department [facility] - [phone]

## 2018-02-23 ENCOUNTER — Other Ambulatory Visit: Payer: Self-pay | Admitting: Family Medicine

## 2018-02-24 NOTE — Telephone Encounter (Signed)
Requested medication (s) are due for refill today: Yes   Requested medication (s) are on the active medication list: Yes  Last refill:  12/07/16  Future visit scheduled: No  Notes to clinic:  See request    Requested Prescriptions  Pending Prescriptions Disp Refills   benazepril (LOTENSIN) 5 MG tablet [Pharmacy Med Name: BENAZEPRIL HCL 5 MG TABLET] 90 tablet 3    Sig: TAKE 1 TABLET BY MOUTH EVERY DAY     Cardiovascular:  ACE Inhibitors Failed - 02/23/2018  1:36 PM      Failed - Cr in normal range and within 180 days    Creat  Date Value Ref Range Status  12/26/2015 0.72 0.50 - 0.99 mg/dL Final    Comment:      For patients > or = 65 years of age: The upper reference limit for Creatinine is approximately 13% higher for people identified as African-American.      Creatinine, Ser  Date Value Ref Range Status  08/26/2017 0.65 0.57 - 1.00 mg/dL Final         Failed - K in normal range and within 180 days    Potassium  Date Value Ref Range Status  08/26/2017 4.4 3.5 - 5.2 mmol/L Final         Failed - Last BP in normal range    BP Readings from Last 1 Encounters:  12/24/17 (!) 146/82         Passed - Patient is not pregnant      Passed - Valid encounter within last 6 months    Recent Outpatient Visits          2 months ago Mild intermittent asthmatic bronchitis with acute exacerbation   [facility] Sandria Balesavid H, MD   6 months ago Epistaxis   [facility], Levell JulyEva N, MD   9 months ago Rash and nonspecific skin eruption   [facility], Meda CoffeeIrma M, MD   1 year ago Type 2 diabetes mellitus without complication, without long-term current use of insulin [facility](HCC)   [facility], Levell JulyEva N, MD   1 year ago Type 2 diabetes mellitus without complication, without long-term current use of insulin [facility](HCC)   [facility], Levell JulyEva N, MD

## 2018-03-02 ENCOUNTER — Telehealth: Payer: Self-pay | Admitting: Family Medicine

## 2018-03-02 NOTE — Telephone Encounter (Signed)
Called patient in regards to needing to know where she had her diabetic eye exam done at so we can obtain those records. Her mailbox was full so couldn't leave a VM.

## 2018-04-06 ENCOUNTER — Telehealth: Payer: Self-pay | Admitting: Family Medicine

## 2018-04-07 NOTE — Telephone Encounter (Signed)
Refill request for benazepril; refill request from 02/28/28 reads "appointment needed for additional  Refills"; no upcoming visits noted; unable to leave message; pre-recorded message states voicemail is full; will route to office for final disposition.

## 2018-04-08 NOTE — Telephone Encounter (Signed)
Patient is calling in regards to getting this refilled. Patient has 6 tablets left.   CB# [phone]

## 2018-04-16 ENCOUNTER — Telehealth: Payer: Self-pay | Admitting: *Deleted

## 2018-04-16 ENCOUNTER — Other Ambulatory Visit: Payer: Self-pay | Admitting: *Deleted

## 2018-04-16 MED ORDER — BENAZEPRIL HCL 5 MG PO TABS: 5.0000 mg | ORAL_TABLET | Freq: Every day | ORAL | 0 refills | Status: DC

## 2018-04-16 NOTE — Telephone Encounter (Addendum)
Patient made an appt for Meds Refill on 05/14/2018, but she only has 15 tablets left of the medication.  She stated she will need something called in to cover the days until her appt with Dr. Clelia Croft. She would like the prescription sent to [facility] [address]

## 2018-04-16 NOTE — Telephone Encounter (Signed)
Spoke with patient. She stated that she will run out of medication before her appointment. Sent to [facility] [address] Benazepril 5 mg #15 tablets. Pt advised no further refills will be given until she comes in for appointment on 05/14/18.

## 2018-04-30 ENCOUNTER — Other Ambulatory Visit: Payer: Self-pay | Admitting: Family Medicine

## 2018-05-05 ENCOUNTER — Telehealth: Payer: Self-pay | Admitting: Family Medicine

## 2018-05-05 NOTE — Telephone Encounter (Signed)
Patient has an appt with Dr. Clelia Croft on 05/28/2018, which was rescheduled from 05/14/2018. Patient states she will be out of her medication benazepril before her new appt date.

## 2018-05-06 ENCOUNTER — Other Ambulatory Visit: Payer: Self-pay | Admitting: *Deleted

## 2018-05-06 MED ORDER — BENAZEPRIL HCL 5 MG PO TABS: 5.0000 mg | ORAL_TABLET | Freq: Every day | ORAL | 0 refills | Status: DC

## 2018-05-12 ENCOUNTER — Ambulatory Visit: Payer: Self-pay | Admitting: Family Medicine

## 2018-05-12 ENCOUNTER — Other Ambulatory Visit: Payer: Self-pay | Admitting: Family Medicine

## 2018-05-12 NOTE — Telephone Encounter (Signed)
Copied from CRM [id number]. Topic: Quick Communication - Rx Refill/Question >> May 12, 2018  1:13 PM Jaquita Rector A wrote: Medication: benazepril (LOTENSIN) 5 MG tablet   Has the patient contacted their pharmacy? Yes (Agent: If no, request that the patient contact the pharmacy for the refill.) (Agent: If yes, when and what did the pharmacy advise?)  Preferred Pharmacy (with phone number or street name): [id number] [place], [place] - [address] [phone] (Phone) [phone] (Fax)    Agent: Please be advised that RX refills may take up to 3 business days. We ask that you follow-up with your pharmacy.

## 2018-05-14 ENCOUNTER — Ambulatory Visit: Payer: Self-pay | Admitting: Family Medicine

## 2018-05-28 ENCOUNTER — Ambulatory Visit (INDEPENDENT_AMBULATORY_CARE_PROVIDER_SITE_OTHER): Payer: Medicare Other | Admitting: Family Medicine

## 2018-05-28 ENCOUNTER — Other Ambulatory Visit: Payer: Self-pay

## 2018-05-28 ENCOUNTER — Encounter: Payer: Self-pay | Admitting: Family Medicine

## 2018-05-28 VITALS — BP 148/88 | HR 95 | Temp 98.0°F | Resp 16 | Ht 59.0 in | Wt 127.0 lb

## 2018-05-28 DIAGNOSIS — E7800 Pure hypercholesterolemia, unspecified: Secondary | ICD-10-CM

## 2018-05-28 DIAGNOSIS — E039 Hypothyroidism, unspecified: Secondary | ICD-10-CM

## 2018-05-28 DIAGNOSIS — E119 Type 2 diabetes mellitus without complications: Secondary | ICD-10-CM | POA: Diagnosis not present

## 2018-05-28 DIAGNOSIS — E2839 Other primary ovarian failure: Secondary | ICD-10-CM

## 2018-05-28 DIAGNOSIS — I10 Essential (primary) hypertension: Secondary | ICD-10-CM | POA: Diagnosis not present

## 2018-05-28 DIAGNOSIS — Z1231 Encounter for screening mammogram for malignant neoplasm of breast: Secondary | ICD-10-CM

## 2018-05-28 MED ORDER — BENAZEPRIL HCL 10 MG PO TABS: 10.0000 mg | ORAL_TABLET | Freq: Every day | ORAL | 1 refills | Status: DC

## 2018-05-28 MED ORDER — ZOSTER VAC RECOMB ADJUVANTED 50 MCG/0.5ML IM SUSR: 0.5000 mL | Freq: Once | INTRAMUSCULAR | 1 refills | Status: AC

## 2018-05-28 NOTE — Patient Instructions (Addendum)
   If you have lab work done today you will be contacted with your lab results within the next 2 weeks.  If you have not heard from us then please contact us. The fastest way to get your results is to register for My Chart.   IF you received an x-ray today, you will receive an invoice from [facility]. Please contact [facility] at [phone] with questions or concerns regarding your invoice.   IF you received labwork today, you will receive an invoice from [facility]. Please contact [facility] at [phone] with questions or concerns regarding your invoice.   Our billing staff will not be able to assist you with questions regarding bills from these companies.  You will be contacted with the lab results as soon as they are available. The fastest way to get your results is to activate your My Chart account. Instructions are located on the last page of this paperwork. If you have not heard from us regarding the results in 2 weeks, please contact this office.     Health Maintenance After Age 65 After age 65, you are at a higher risk for certain long-term diseases and infections as well as injuries from falls. Falls are a major cause of broken bones and head injuries in people who are older than age 65. Getting regular preventive care can help to keep you healthy and well. Preventive care includes getting regular testing and making lifestyle changes as recommended by your health care provider. Talk with your health care provider about:  Which screenings and tests you should have. A screening is a test that checks for a disease when you have no symptoms.  A diet and exercise plan that is right for you. What should I know about screenings and tests to prevent falls? Screening and testing are the best ways to find a health problem early. Early diagnosis and treatment give you the best chance of managing medical conditions that are common after age 65. Certain conditions and  lifestyle choices may make you more likely to have a fall. Your health care provider may recommend:  Regular vision checks. Poor vision and conditions such as cataracts can make you more likely to have a fall. If you wear glasses, make sure to get your prescription updated if your vision changes.  Medicine review. Work with your health care provider to regularly review all of the medicines you are taking, including over-the-counter medicines. Ask your health care provider about any side effects that may make you more likely to have a fall. Tell your health care provider if any medicines that you take make you feel dizzy or sleepy.  Osteoporosis screening. Osteoporosis is a condition that causes the bones to get weaker. This can make the bones weak and cause them to break more easily.  Blood pressure screening. Blood pressure changes and medicines to control blood pressure can make you feel dizzy.  Strength and balance checks. Your health care provider may recommend certain tests to check your strength and balance while standing, walking, or changing positions.  Foot health exam. Foot pain and numbness, as well as not wearing proper footwear, can make you more likely to have a fall.  Depression screening. You may be more likely to have a fall if you have a fear of falling, feel emotionally low, or feel unable to do activities that you used to do.  Alcohol use screening. Using too much alcohol can affect your balance and may make you more likely to   have a fall. What actions can I take to lower my risk of falls? General instructions  Talk with your health care provider about your risks for falling. Tell your health care provider if: ? You fall. Be sure to tell your health care provider about all falls, even ones that seem minor. ? You feel dizzy, sleepy, or off-balance.  Take over-the-counter and prescription medicines only as told by your health care provider. These include any  supplements.  Eat a healthy diet and maintain a healthy weight. A healthy diet includes low-fat dairy products, low-fat (lean) meats, and fiber from whole grains, beans, and lots of fruits and vegetables. Home safety  Remove any tripping hazards, such as rugs, cords, and clutter.  Install safety equipment such as grab bars in bathrooms and safety rails on stairs.  Keep rooms and walkways well-lit. Activity   Follow a regular exercise program to stay fit. This will help you maintain your balance. Ask your health care provider what types of exercise are appropriate for you.  If you need a cane or walker, use it as recommended by your health care provider.  Wear supportive shoes that have nonskid soles. Lifestyle  Do not drink alcohol if your health care provider tells you not to drink.  If you drink alcohol, limit how much you have: ? 0-1 drink a day for women. ? 0-2 drinks a day for men.  Be aware of how much alcohol is in your drink. In the [place] one drink equals one typical bottle of beer (12 oz), one-half glass of wine (5 oz), or one shot of hard liquor (1 oz).  Do not use any products that contain nicotine or tobacco, such as cigarettes and e-cigarettes. If you need help quitting, ask your health care provider. Summary  Having a healthy lifestyle and getting preventive care can help to protect your health and wellness after age 65.  Screening and testing are the best way to find a health problem early and help you avoid having a fall. Early diagnosis and treatment give you the best chance for managing medical conditions that are more common for people who are older than age 65.  Falls are a major cause of broken bones and head injuries in people who are older than age 65. Take precautions to prevent a fall at home.  Work with your health care provider to learn what changes you can make to improve your health and wellness and to prevent falls. This information is not intended  to replace advice given to you by your health care provider. Make sure you discuss any questions you have with your health care provider. Document Released: 01/29/2017 Document Revised: 01/29/2017 Document Reviewed: 01/29/2017 Elsevier Interactive Patient Education  2019 [employer]  

## 2018-05-28 NOTE — Progress Notes (Signed)
Subjective:    Patient: Diana Gilmore  DOB: 10-22-1950; 68 y.o.   MRN: [id number]  Chief Complaint  Patient presents with  . Hypertension    follow-up    HPI Has been walking more - but about 2 x/wk. She is trying to eat more healthy Taking mvi.   BP at home 137/76  Does check cbgs at home and run 90s to low 100s - tries to avoid sugar  Drinking a lot of water instead of eating when she feels hungry.  Eating a lot of veggies.  Medical History Past Medical History:  Diagnosis Date  . Hypertension   . Thyroid disease    Past Surgical History:  Procedure Laterality Date  . THYROID SURGERY     Current Outpatient Medications on File Prior to Visit  Medication Sig Dispense Refill  . albuterol (PROVENTIL HFA;VENTOLIN HFA) 108 (90 Base) MCG/ACT inhaler Inhale 2 puffs into the lungs every 4 (four) hours as needed for wheezing or shortness of breath (cough, shortness of breath or wheezing.). 1 Inhaler 1  . benazepril (LOTENSIN) 5 MG tablet Take 1 tablet (5 mg total) by mouth daily. 30 tablet 0  . Blood Glucose Monitoring Suppl (D-CARE GLUCOMETER) w/Device KIT 1 Units by Does not apply route daily. 1 kit 0  . Cholecalciferol (VITAMIN D3) 1000 units CAPS Take by mouth.    . levothyroxine (SYNTHROID, LEVOTHROID) 100 MCG tablet Take 1 tablet (100 mcg total) by mouth daily. 90 tablet 3  . Multiple Vitamins-Minerals (CENTRUM SILVER 50+WOMEN) TABS Take 1 tablet by mouth.    . pravastatin (PRAVACHOL) 20 MG tablet TAKE 1 TABLET BY MOUTH EVERY DAY 90 tablet 1  . valACYclovir (VALTREX) 1000 MG tablet At the first sign of a cold sore, take 2 tabs and then take another 2 tabs 12 hours later. 20 tablet 2   No current facility-administered medications on file prior to visit.    No Known Allergies History reviewed. No pertinent family history. Social History   Socioeconomic History  . Marital status: Married    Spouse name: Not on file  . Number of children: 4  . Years of education: Not on  file  . Highest education level: Not on file  Occupational History  . Not on file  Social Needs  . Financial resource strain: Not on file  . Food insecurity:    Worry: Not on file    Inability: Not on file  . Transportation needs:    Medical: Not on file    Non-medical: Not on file  Tobacco Use  . Smoking status: Never Smoker  . Smokeless tobacco: Never Used  Substance and Sexual Activity  . Alcohol use: No    Alcohol/week: 0.0 standard drinks  . Drug use: No  . Sexual activity: Yes  Lifestyle  . Physical activity:    Days per week: Not on file    Minutes per session: Not on file  . Stress: Not on file  Relationships  . Social connections:    Talks on phone: Not on file    Gets together: Not on file    Attends religious service: Not on file    Active member of club or organization: Not on file    Attends meetings of clubs or organizations: Not on file    Relationship status: Not on file  Other Topics Concern  . Not on file  Social History Narrative  . Not on file   Depression screen [facility] 2/9 05/28/2018 12/24/2017 08/26/2017 05/23/2017  12/06/2016  Decreased Interest 0 0 0 0 0  Down, Depressed, Hopeless 0 0 0 0 0  PHQ - 2 Score 0 0 0 0 0  Altered sleeping - - - 0 -  Tired, decreased energy - - - 0 -  Change in appetite - - - 0 -  Feeling bad or failure about yourself  - - - 0 -  Trouble concentrating - - - 0 -  Moving slowly or fidgety/restless - - - 0 -  Suicidal thoughts - - - 0 -  PHQ-9 Score - - - 0 -    ROS As noted in HPI  Objective:  BP (!) 148/88   Pulse 95   Temp 98 F (36.7 C) (Oral)   Resp 16   Ht '4\' 11"'$  (1.499 m)   Wt 127 lb (57.6 kg)   SpO2 98%   BMI 25.65 kg/m  Physical Exam Constitutional:      General: She is not in acute distress.    Appearance: She is well-developed. She is not diaphoretic.  HENT:     Head: Normocephalic and atraumatic.     Right Ear: External ear normal.     Left Ear: External ear normal.  Eyes:     General: No  scleral icterus.    Conjunctiva/sclera: Conjunctivae normal.  Neck:     Musculoskeletal: Normal range of motion and neck supple.     Thyroid: No thyromegaly.  Cardiovascular:     Rate and Rhythm: Normal rate and regular rhythm.     Heart sounds: Normal heart sounds.  Pulmonary:     Effort: Pulmonary effort is normal. No respiratory distress.     Breath sounds: Normal breath sounds.  Lymphadenopathy:     Cervical: No cervical adenopathy.  Skin:    General: Skin is warm and dry.     Findings: No erythema.  Neurological:     Mental Status: She is alert and oriented to person, place, and time.  Psychiatric:        Behavior: Behavior normal.     [place] TESTING Office Visit on 05/28/2018  Component Date Value Ref Range Status  . TSH 05/28/2018 18.230* 0.450 - 4.500 uIU/mL Final  . Glucose 05/28/2018 97  65 - 99 mg/dL Final  . BUN 05/28/2018 14  8 - 27 mg/dL Final  . Creatinine, Ser 05/28/2018 0.65  0.57 - 1.00 mg/dL Final  . GFR calc non Af Amer 05/28/2018 93  >59 mL/min/1.73 Final  . GFR calc Af Amer 05/28/2018 108  >59 mL/min/1.73 Final  . BUN/Creatinine Ratio 05/28/2018 22  12 - 28 Final  . Sodium 05/28/2018 141  134 - 144 mmol/L Final  . Potassium 05/28/2018 4.2  3.5 - 5.2 mmol/L Final  . Chloride 05/28/2018 104  96 - 106 mmol/L Final  . CO2 05/28/2018 21  20 - 29 mmol/L Final  . Calcium 05/28/2018 9.9  8.7 - 10.3 mg/dL Final  . Total Protein 05/28/2018 7.7  6.0 - 8.5 g/dL Final  . Albumin 05/28/2018 4.8  3.8 - 4.8 g/dL Final                 **Please note reference interval change**  . Globulin, Total 05/28/2018 2.9  1.5 - 4.5 g/dL Final  . Albumin/Globulin Ratio 05/28/2018 1.7  1.2 - 2.2 Final  . Bilirubin Total 05/28/2018 0.4  0.0 - 1.2 mg/dL Final  . Alkaline Phosphatase 05/28/2018 59  39 - 117 IU/L Final  . AST 05/28/2018 22  0 - 40 IU/L Final  . ALT 05/28/2018 18  0 - 32 IU/L Final  . Creatinine, Urine 05/28/2018 52.3  Not Estab. mg/dL Final  . Microalbumin, Urine  05/28/2018 51.7  Not Estab. ug/mL Final  . Microalb/Creat Ratio 05/28/2018 99* 0 - 29 mg/g creat Final   Comment:                        Normal:                0 -  29                        Moderately increased: 30 - 300                        Severely increased:       >300               **Please note reference interval change**   . Cholesterol, Total 05/28/2018 220* 100 - 199 mg/dL Final  . Triglycerides 05/28/2018 317* 0 - 149 mg/dL Final  . HDL 05/28/2018 43  >39 mg/dL Final  . VLDL Cholesterol Cal 05/28/2018 63* 5 - 40 mg/dL Final  . LDL Calculated 05/28/2018 114* 0 - 99 mg/dL Final  . Chol/HDL Ratio 05/28/2018 5.1* 0.0 - 4.4 ratio Final   Comment:                                   T. Chol/HDL Ratio                                             Men  Women                               1/2 Avg.Risk  3.4    3.3                                   Avg.Risk  5.0    4.4                                2X Avg.Risk  9.6    7.1                                3X Avg.Risk 23.4   11.0   . Hgb A1c MFr Bld 05/28/2018 6.1* 4.8 - 5.6 % Final   Comment:          Prediabetes: 5.7 - 6.4          Diabetes: >6.4          Glycemic control for adults with diabetes: <7.0   . Est. average glucose Bld gHb Est-m* 05/28/2018 128  mg/dL Final     Assessment & Plan:   1. Type 2 diabetes mellitus without complication, without long-term current use of insulin ([place]) - well controlled by lifestyle Lab Results  Component Value Date   HGBA1C 6.1 (H) 05/28/2018   HGBA1C 6.5 (A) 08/26/2017  HGBA1C 6.1 (H) 12/06/2016     2. Essential hypertension - elevated at home at 130-140/80s so increase benazepril from 5 to '10mg'$   3. Pure hypercholesterolemia - uncontrolled due to elev trig but will get thyroid regulated then recheck 3-4 mos later when completely fasting - if not at goal, increase pravastatin 20 at that time but will cont on same dose for now due to severity of hypothyroid  4. Encounter for screening  mammogram for breast cancer   5. Estrogen deficiency   6. Acquired hypothyroidism - On levothyroxine 100 - states she is taking correctly. Increase to 125 and recheck in 2 mos. Lab Results  Component Value Date   TSH 18.230 (H) 05/28/2018   TSH 1.360 08/26/2017   TSH 1.170 05/23/2017   TSH 0.085 (L) 12/06/2016   TSH 0.109 (L) 08/31/2016      Refused prevnar-13 vaccine - states she will get it at her next visit.   Patient will continue on current chronic medications other than changes noted above, so ok to refill when needed.   See after visit summary for patient specific instructions.  Orders Placed This Encounter  Procedures  . DG Bone Density    Standing Status:   Future    Standing Expiration Date:   07/27/2019    Order Specific Question:   Reason for Exam (SYMPTOM  OR DIAGNOSIS REQUIRED)    Answer:   estrogen deficiency    Order Specific Question:   Preferred imaging location?    Answer:   [facility]  . MM DIGITAL SCREENING BILATERAL    Standing Status:   Future    Standing Expiration Date:   07/27/2019    Order Specific Question:   Reason for Exam (SYMPTOM  OR DIAGNOSIS REQUIRED)    Answer:   screening for breast cancer    Order Specific Question:   Preferred imaging location?    Answer:   [facility]  . Pneumococcal conjugate vaccine 13-valent IM  . TSH  . Comprehensive metabolic panel  . Microalbumin / creatinine urine ratio  . POCT glycosylated hemoglobin (Hb A1C)  . POCT urinalysis dipstick    Meds ordered this encounter  Medications  . benazepril (LOTENSIN) 10 MG tablet    Sig: Take 1 tablet (10 mg total) by mouth daily.    Dispense:  90 tablet    Refill:  1  . Zoster Vaccine Adjuvanted [facility]) injection    Sig: Inject 0.5 mLs into the muscle once for 1 dose. Repeat once in 2 to 6 months    Dispense:  1 each    Refill:  1  . levothyroxine (SYNTHROID, LEVOTHROID) 125 MCG tablet    Sig: Take 1 tablet (125 mcg total) by mouth daily before  breakfast. **NEED OFFICE VISIT BEFORE FURTHER REFILLS DUE TO DOSE CHANGE**    Dispense:  90 tablet    Refill:  0    D/C RX FOR LEVOTHYROXINE 100  . pravastatin (PRAVACHOL) 20 MG tablet    Sig: Take 1 tablet (20 mg total) by mouth daily.    Dispense:  90 tablet    Refill:  1    Patient verbalized to me that they understand the following: diagnosis, what is being done for them, what to expect and what should be done at home.  Their questions have been answered. They understand that I am unable to predict every possible medication interaction or adverse outcome and that if any unexpected symptoms arise, they should contact us and their pharmacist,  as well as never hesitate to seek urgent/emergent care at [facility] or ER if they think it might be warranted.    Delman Cheadle, MD, MPH Primary Care at [place], [place]  [address] [phone] Office phone  [phone] Office fax  05/28/18 12:17 PM

## 2018-05-29 LAB — MICROALBUMIN / CREATININE URINE RATIO
Creatinine, Urine: 52.3 mg/dL
Microalb/Creat Ratio: 99 mg/g creat — ABNORMAL HIGH (ref 0–29)
Microalbumin, Urine: 51.7 ug/mL

## 2018-05-29 LAB — COMPREHENSIVE METABOLIC PANEL
ALT: 18 IU/L (ref 0–32)
AST: 22 IU/L (ref 0–40)
Albumin/Globulin Ratio: 1.7 (ref 1.2–2.2)
Albumin: 4.8 g/dL (ref 3.8–4.8)
Alkaline Phosphatase: 59 IU/L (ref 39–117)
BUN/Creatinine Ratio: 22 (ref 12–28)
BUN: 14 mg/dL (ref 8–27)
Bilirubin Total: 0.4 mg/dL (ref 0.0–1.2)
CO2: 21 mmol/L (ref 20–29)
Calcium: 9.9 mg/dL (ref 8.7–10.3)
Chloride: 104 mmol/L (ref 96–106)
Creatinine, Ser: 0.65 mg/dL (ref 0.57–1.00)
GFR calc Af Amer: 108 mL/min/{1.73_m2} (ref >59)
GFR calc non Af Amer: 93 mL/min/{1.73_m2} (ref >59)
Globulin, Total: 2.9 g/dL (ref 1.5–4.5)
Glucose: 97 mg/dL (ref 65–99)
Potassium: 4.2 mmol/L (ref 3.5–5.2)
Sodium: 141 mmol/L (ref 134–144)
TOTAL PROTEIN: 7.7 g/dL (ref 6.0–8.5)

## 2018-05-29 LAB — HEMOGLOBIN A1C
Est. average glucose Bld gHb Est-mCnc: 128 mg/dL
HEMOGLOBIN A1C: 6.1 % — AB (ref 4.8–5.6)

## 2018-05-29 LAB — LIPID PANEL
CHOL/HDL RATIO: 5.1 ratio — AB (ref 0.0–4.4)
Cholesterol, Total: 220 mg/dL — ABNORMAL HIGH (ref 100–199)
HDL: 43 mg/dL (ref >39)
LDL Calculated: 114 mg/dL — ABNORMAL HIGH (ref 0–99)
TRIGLYCERIDES: 317 mg/dL — AB (ref 0–149)
VLDL Cholesterol Cal: 63 mg/dL — ABNORMAL HIGH (ref 5–40)

## 2018-05-29 LAB — TSH: TSH: 18.23 u[IU]/mL — AB (ref 0.450–4.500)

## 2018-06-01 ENCOUNTER — Other Ambulatory Visit: Payer: Self-pay | Admitting: Family Medicine

## 2018-06-06 MED ORDER — LEVOTHYROXINE SODIUM 125 MCG PO TABS: 125.0000 ug | ORAL_TABLET | Freq: Every day | ORAL | 0 refills | Status: DC

## 2018-06-06 MED ORDER — PRAVASTATIN SODIUM 20 MG PO TABS: 20.0000 mg | ORAL_TABLET | Freq: Every day | ORAL | 1 refills | Status: DC

## 2018-06-08 ENCOUNTER — Encounter: Payer: Self-pay | Admitting: Radiology

## 2018-06-09 ENCOUNTER — Other Ambulatory Visit: Payer: Self-pay | Admitting: Family Medicine

## 2018-10-17 DIAGNOSIS — H6980 Other specified disorders of Eustachian tube, unspecified ear: Secondary | ICD-10-CM | POA: Diagnosis not present

## 2018-12-10 ENCOUNTER — Other Ambulatory Visit: Payer: Self-pay | Admitting: Family Medicine

## 2018-12-10 NOTE — Telephone Encounter (Signed)
benazepril (LOTENSIN) 10 MG tablet/levothyroxine (SYNTHROID, LEVOTHROID) 125 MCG tablet /pravastatin (PRAVACHOL) 20 MG tablet  Has the patient contacted their pharmacy? Yes.   (Agent: If no, request that the patient contact the pharmacy for the refill.) (Agent: If yes, when and what did the pharmacy advise?)  Preferred Pharmacy (with phone number or street name):  [id number] [address], [place] [phone] (Phone) [phone] (Fax)     Agent: Please be advised that RX refills may take up to 3 business days. We ask that you follow-up with your pharmacy.

## 2018-12-10 NOTE — Telephone Encounter (Signed)
Requested medication (s) are due for refill today: yes  Requested medication (s) are on the active medication list: yes  Last refill:  05/28/2018  Future visit scheduled: no  Notes to clinic:  Review for refill   Requested Prescriptions  Pending Prescriptions Disp Refills   benazepril (LOTENSIN) 10 MG tablet 90 tablet 1    Sig: Take 1 tablet (10 mg total) by mouth daily.     Cardiovascular:  ACE Inhibitors Failed - 12/10/2018  8:32 AM      Failed - Cr in normal range and within 180 days    Creat  Date Value Ref Range Status  12/26/2015 0.72 0.50 - 0.99 mg/dL Final    Comment:      For patients > or = 66 years of age: The upper reference limit for Creatinine is approximately 13% higher for people identified as African-American.      Creatinine, Ser  Date Value Ref Range Status  05/28/2018 0.65 0.57 - 1.00 mg/dL Final         Failed - K in normal range and within 180 days    Potassium  Date Value Ref Range Status  05/28/2018 4.2 3.5 - 5.2 mmol/L Final         Failed - Last BP in normal range    BP Readings from Last 1 Encounters:  05/28/18 (!) 148/88         Failed - Valid encounter within last 6 months    Recent Outpatient Visits          6 months ago Type 2 diabetes mellitus without complication, without long-term current use of insulin ([place])   [facility] at [facility], Laurey Arrow, MD   11 months ago Mild intermittent asthmatic bronchitis with acute exacerbation   [facility] at [facility], Fenton Malling, MD   1 year ago Epistaxis   [facility] at [facility], Laurey Arrow, MD   1 year ago Rash and nonspecific skin eruption   [facility] at [facility], Lilia Argue, MD   2 years ago Type 2 diabetes mellitus without complication, without long-term current use of insulin [facility])   [facility] at [facility], Laurey Arrow, MD             Passed - Patient is not pregnant       levothyroxine (SYNTHROID) 125 MCG tablet 90 tablet 0    Sig: Take 1 tablet (125 mcg total) by  mouth daily before breakfast. **NEED OFFICE VISIT BEFORE FURTHER REFILLS DUE TO DOSE CHANGE**     Endocrinology:  Hypothyroid Agents Failed - 12/10/2018  8:32 AM      Failed - TSH needs to be rechecked within 3 months after an abnormal result. Refill until TSH is due.      Failed - TSH in normal range and within 360 days    TSH  Date Value Ref Range Status  05/28/2018 18.230 (H) 0.450 - 4.500 uIU/mL Final         Passed - Valid encounter within last 12 months    Recent Outpatient Visits          6 months ago Type 2 diabetes mellitus without complication, without long-term current use of insulin [facility])   Primary Care at [place], MD   11 months ago Mild intermittent asthmatic bronchitis with acute exacerbation   Primary Care at [facility], Fenton Malling, MD   1 year ago Epistaxis   Primary Care at [facility],  Levell JulyEva N, MD   1 year ago Rash and nonspecific skin eruption   [facility], Meda CoffeeIrma M, MD   2 years ago Type 2 diabetes mellitus without complication, without long-term current use of insulin [facility](HCC)   [facility], Levell JulyEva N, MD              pravastatin (PRAVACHOL) 20 MG tablet 90 tablet 1    Sig: Take 1 tablet (20 mg total) by mouth daily.     Cardiovascular:  Antilipid - Statins Failed - 12/10/2018  8:32 AM      Failed - Total Cholesterol in normal range and within 360 days    Cholesterol, Total  Date Value Ref Range Status  05/28/2018 220 (H) 100 - 199 mg/dL Final         Failed - LDL in normal range and within 360 days    LDL Calculated  Date Value Ref Range Status  05/28/2018 114 (H) 0 - 99 mg/dL Final         Failed - Triglycerides in normal range and within 360 days    Triglycerides  Date Value Ref Range Status  05/28/2018 317 (H) 0 - 149 mg/dL Final         Passed - HDL in normal range and within 360 days    HDL  Date Value Ref Range Status  05/28/2018 43 >39 mg/dL Final         Passed - Patient is not pregnant       Passed - Valid encounter within last 12 months    Recent Outpatient Visits          6 months ago Type 2 diabetes mellitus without complication, without long-term current use of insulin (HCC)   [facility], Levell JulyEva N, MD   11 months ago Mild intermittent asthmatic bronchitis with acute exacerbation   [facility], Sandria Balesavid H, MD   1 year ago Epistaxis   [facility], Levell JulyEva N, MD   1 year ago Rash and nonspecific skin eruption   [facility], Meda CoffeeIrma M, MD   2 years ago Type 2 diabetes mellitus without complication, without long-term current use of insulin [facility](HCC)   [facility], Levell JulyEva N, MD

## 2018-12-11 ENCOUNTER — Other Ambulatory Visit: Payer: Self-pay

## 2018-12-11 DIAGNOSIS — E079 Disorder of thyroid, unspecified: Secondary | ICD-10-CM

## 2018-12-11 DIAGNOSIS — E7800 Pure hypercholesterolemia, unspecified: Secondary | ICD-10-CM

## 2018-12-11 MED ORDER — PRAVASTATIN SODIUM 20 MG PO TABS: 20.0000 mg | ORAL_TABLET | Freq: Every day | ORAL | 0 refills | Status: DC

## 2018-12-11 MED ORDER — LEVOTHYROXINE SODIUM 125 MCG PO TABS: 125.0000 ug | ORAL_TABLET | Freq: Every day | ORAL | 0 refills | Status: DC

## 2018-12-11 MED ORDER — BENAZEPRIL HCL 10 MG PO TABS: 10.0000 mg | ORAL_TABLET | Freq: Every day | ORAL | 0 refills | Status: DC

## 2018-12-30 ENCOUNTER — Ambulatory Visit (INDEPENDENT_AMBULATORY_CARE_PROVIDER_SITE_OTHER): Payer: Medicare Other | Admitting: Emergency Medicine

## 2018-12-30 ENCOUNTER — Other Ambulatory Visit: Payer: Self-pay

## 2018-12-30 ENCOUNTER — Encounter: Payer: Self-pay | Admitting: Emergency Medicine

## 2018-12-30 VITALS — BP 165/90 | HR 85 | Temp 98.1°F | Resp 16 | Ht 61.0 in | Wt 130.4 lb

## 2018-12-30 DIAGNOSIS — I152 Hypertension secondary to endocrine disorders: Secondary | ICD-10-CM

## 2018-12-30 DIAGNOSIS — I10 Essential (primary) hypertension: Secondary | ICD-10-CM | POA: Diagnosis not present

## 2018-12-30 DIAGNOSIS — E1159 Type 2 diabetes mellitus with other circulatory complications: Secondary | ICD-10-CM | POA: Diagnosis not present

## 2018-12-30 DIAGNOSIS — E079 Disorder of thyroid, unspecified: Secondary | ICD-10-CM | POA: Diagnosis not present

## 2018-12-30 DIAGNOSIS — E1169 Type 2 diabetes mellitus with other specified complication: Secondary | ICD-10-CM | POA: Diagnosis not present

## 2018-12-30 DIAGNOSIS — E785 Hyperlipidemia, unspecified: Secondary | ICD-10-CM

## 2018-12-30 MED ORDER — BENAZEPRIL HCL 10 MG PO TABS: 10.0000 mg | ORAL_TABLET | Freq: Every day | ORAL | 3 refills | Status: DC

## 2018-12-30 MED ORDER — LEVOTHYROXINE SODIUM 125 MCG PO TABS: 125.0000 ug | ORAL_TABLET | Freq: Every day | ORAL | 3 refills | Status: DC

## 2018-12-30 MED ORDER — PRAVASTATIN SODIUM 20 MG PO TABS: 20.0000 mg | ORAL_TABLET | Freq: Every day | ORAL | 3 refills | Status: DC

## 2018-12-30 NOTE — Addendum Note (Signed)
Addended by: Davina Poke on: 12/30/2018 01:48 PM   Modules accepted: Orders

## 2018-12-30 NOTE — Patient Instructions (Addendum)
   If you have lab work done today you will be contacted with your lab results within the next 2 weeks.  If you have not heard from us then please contact us. The fastest way to get your results is to register for My Chart.   IF you received an x-ray today, you will receive an invoice from [facility]. Please contact [facility] at [phone] with questions or concerns regarding your invoice.   IF you received labwork today, you will receive an invoice from [facility]. Please contact [facility] at [phone] with questions or concerns regarding your invoice.   Our billing staff will not be able to assist you with questions regarding bills from these companies.  You will be contacted with the lab results as soon as they are available. The fastest way to get your results is to activate your My Chart account. Instructions are located on the last page of this paperwork. If you have not heard from us regarding the results in 2 weeks, please contact this office.      Hypothyroidism  Hypothyroidism is when the thyroid gland does not make enough of certain hormones (it is underactive). The thyroid gland is a small gland located in the lower front part of the neck, just in front of the windpipe (trachea). This gland makes hormones that help control how the body uses food for energy (metabolism) as well as how the heart and brain function. These hormones also play a role in keeping your bones strong. When the thyroid is underactive, it produces too little of the hormones thyroxine (T4) and triiodothyronine (T3). What are the causes? This condition may be caused by:  Hashimoto's disease. This is a disease in which the body's disease-fighting system (immune system) attacks the thyroid gland. This is the most common cause.  Viral infections.  Pregnancy.  Certain medicines.  Birth defects.  Past radiation treatments to the head or neck for cancer.  Past treatment with  radioactive iodine.  Past exposure to radiation in the environment.  Past surgical removal of part or all of the thyroid.  Problems with a gland in the center of the brain (pituitary gland).  Lack of enough iodine in the diet. What increases the risk? You are more likely to develop this condition if:  You are female.  You have a family history of thyroid conditions.  You use a medicine called lithium.  You take medicines that affect the immune system (immunosuppressants). What are the signs or symptoms? Symptoms of this condition include:  Feeling as though you have no energy (lethargy).  Not being able to tolerate cold.  Weight gain that is not explained by a change in diet or exercise habits.  Lack of appetite.  Dry skin.  Coarse hair.  Menstrual irregularity.  Slowing of thought processes.  Constipation.  Sadness or depression. How is this diagnosed? This condition may be diagnosed based on:  Your symptoms, your medical history, and a physical exam.  Blood tests. You may also have imaging tests, such as an ultrasound or MRI. How is this treated? This condition is treated with medicine that replaces the thyroid hormones that your body does not make. After you begin treatment, it may take several weeks for symptoms to go away. Follow these instructions at home:  Take over-the-counter and prescription medicines only as told by your health care provider.  If you start taking any new medicines, tell your health care provider.  Keep all follow-up visits as told by   your health care provider. This is important. ? As your condition improves, your dosage of thyroid hormone medicine may change. ? You will need to have blood tests regularly so that your health care provider can monitor your condition. Contact a health care provider if:  Your symptoms do not get better with treatment.  You are taking thyroid replacement medicine and you: ? Sweat a lot. ? Have  tremors. ? Feel anxious. ? Lose weight rapidly. ? Cannot tolerate heat. ? Have emotional swings. ? Have diarrhea. ? Feel weak. Get help right away if you have:  Chest pain.  An irregular heartbeat.  A rapid heartbeat.  Difficulty breathing. Summary  Hypothyroidism is when the thyroid gland does not make enough of certain hormones (it is underactive).  When the thyroid is underactive, it produces too little of the hormones thyroxine (T4) and triiodothyronine (T3).  The most common cause is Hashimoto's disease, a disease in which the body's disease-fighting system (immune system) attacks the thyroid gland. The condition can also be caused by viral infections, medicine, pregnancy, or past radiation treatment to the head or neck.  Symptoms may include weight gain, dry skin, constipation, feeling as though you do not have energy, and not being able to tolerate cold.  This condition is treated with medicine to replace the thyroid hormones that your body does not make. This information is not intended to replace advice given to you by your health care provider. Make sure you discuss any questions you have with your health care provider. Document Released: 03/18/2005 Document Revised: 02/28/2017 Document Reviewed: 02/26/2017 Elsevier Patient Education  2020 [employer]  

## 2018-12-30 NOTE — Addendum Note (Signed)
Addended by: Alfredia Ferguson A on: 12/30/2018 01:46 PM   Modules accepted: Orders

## 2018-12-30 NOTE — Progress Notes (Addendum)
BP Readings from Last 3 Encounters:  05/28/18 (!) 148/88  12/24/17 (!) 146/82  08/26/17 140/88   Lab Results  Component Value Date   HGBA1C 6.1 (H) 05/28/2018   Lab Results  Component Value Date   CHOL 220 (H) 05/28/2018   HDL 43 05/28/2018   LDLCALC 114 (H) 05/28/2018   TRIG 317 (H) 05/28/2018   CHOLHDL 5.1 (H) 05/28/2018   Diana Gilmore 68 y.o.   Chief Complaint  Patient presents with  . Medication Refill    HISTORY OF PRESENT ILLNESS: This is a 68 y.o. female patient of Dr. Brigitte Pulse, here for medication refill.  Patient is planning on following up with Dr. Brigitte Pulse at her new practice however is in need of thyroid medication, levothyroxine 125 mcg. Has no complaints or medical concerns today.  HPI   Prior to Admission medications   Medication Sig Start Date End Date Taking? Authorizing Provider  benazepril (LOTENSIN) 10 MG tablet Take 1 tablet (10 mg total) by mouth daily. 12/11/18  Yes Forrest Moron, MD  levothyroxine (SYNTHROID) 125 MCG tablet Take 1 tablet (125 mcg total) by mouth daily before breakfast. **NEED OFFICE VISIT BEFORE FURTHER REFILLS DUE TO DOSE CHANGE** 12/11/18  Yes Delia Chimes A, MD  Multiple Vitamins-Minerals (CENTRUM SILVER 50+WOMEN) TABS Take 1 tablet by mouth.   Yes [provider]  pravastatin (PRAVACHOL) 20 MG tablet Take 1 tablet (20 mg total) by mouth daily. 12/11/18  Yes Forrest Moron, MD  albuterol (PROVENTIL HFA;VENTOLIN HFA) 108 (90 Base) MCG/ACT inhaler Inhale 2 puffs into the lungs every 4 (four) hours as needed for wheezing or shortness of breath (cough, shortness of breath or wheezing.). Patient not taking: Reported on 12/30/2018 12/24/17   Posey Boyer, MD  Blood Glucose Monitoring Suppl (D-CARE GLUCOMETER) w/Device KIT 1 Units by Does not apply route daily. 08/31/16   Shawnee Knapp, MD  valACYclovir (VALTREX) 1000 MG tablet At the first sign of a cold sore, take 2 tabs and then take another 2 tabs 12 hours later. Patient not taking:  Reported on 12/30/2018 01/19/15   Shawnee Knapp, MD    Allergies  Allergen Reactions  . Prednisone Other (See Comments)    dizziness    Patient Active Problem List   Diagnosis Date Noted  . Metabolic syndrome X 62/05/5595  . Thyroid disease 08/31/2016  . Hypertension 08/31/2016  . Nuclear sclerotic cataract of both eyes 06/18/2016  . Hyperlipemia 12/27/2015  . Type 2 diabetes mellitus without complication, without long-term current use of insulin ([place]) 12/27/2015    Past Medical History:  Diagnosis Date  . Hypertension   . Thyroid disease     Past Surgical History:  Procedure Laterality Date  . THYROID SURGERY      Social History   Socioeconomic History  . Marital status: Married    Spouse name: Not on file  . Number of children: 4  . Years of education: Not on file  . Highest education level: Not on file  Occupational History  . Not on file  Social Needs  . Financial resource strain: Not on file  . Food insecurity    Worry: Not on file    Inability: Not on file  . Transportation needs    Medical: Not on file    Non-medical: Not on file  Tobacco Use  . Smoking status: Never Smoker  . Smokeless tobacco: Never Used  Substance and Sexual Activity  . Alcohol use: No    Alcohol/week: 0.0  standard drinks  . Drug use: No  . Sexual activity: Yes  Lifestyle  . Physical activity    Days per week: Not on file    Minutes per session: Not on file  . Stress: Not on file  Relationships  . Social Herbalist on phone: Not on file    Gets together: Not on file    Attends religious service: Not on file    Active member of club or organization: Not on file    Attends meetings of clubs or organizations: Not on file    Relationship status: Not on file  . Intimate partner violence    Fear of current or ex partner: Not on file    Emotionally abused: Not on file    Physically abused: Not on file    Forced sexual activity: Not on file  Other Topics Concern  .  Not on file  Social History Narrative  . Not on file    History reviewed. No pertinent family history.   Review of Systems  Constitutional: Negative.  Negative for chills and fever.  HENT: Negative.  Negative for congestion and sore throat.   Respiratory: Negative.  Negative for cough and shortness of breath.   Cardiovascular: Negative.  Negative for chest pain and palpitations.  Gastrointestinal: Negative.  Negative for abdominal pain, nausea and vomiting.  Genitourinary: Negative.   Skin: Negative.   Neurological: Negative.  Negative for dizziness and headaches.  Endo/Heme/Allergies: Negative.   All other systems reviewed and are negative.  Today's Vitals   12/30/18 1322  BP: (!) 165/90  Pulse: 85  Resp: 16  Temp: 98.1 F (36.7 C)  TempSrc: Oral  SpO2: 97%  Weight: 130 lb 6.4 oz (59.1 kg)  Height: 5' 1" (1.549 m)   Body mass index is 24.64 kg/m.   Physical Exam Vitals signs reviewed.  Constitutional:      Appearance: Normal appearance.  HENT:     Head: Normocephalic.  Eyes:     Extraocular Movements: Extraocular movements intact.     Pupils: Pupils are equal, round, and reactive to light.  Neck:     Musculoskeletal: Normal range of motion.  Cardiovascular:     Rate and Rhythm: Normal rate and regular rhythm.     Heart sounds: Normal heart sounds.  Pulmonary:     Effort: Pulmonary effort is normal.  Musculoskeletal: Normal range of motion.  Skin:    General: Skin is warm and dry.  Neurological:     General: No focal deficit present.     Mental Status: She is alert and oriented to person, place, and time.  Psychiatric:        Mood and Affect: Mood normal.        Behavior: Behavior normal.      ASSESSMENT & PLAN: Diana Gilmore was seen today for medication refill.  Diagnoses and all orders for this visit:  Thyroid disease -     levothyroxine (SYNTHROID) 125 MCG tablet; Take 1 tablet (125 mcg total) by mouth daily before breakfast.  Hypertension associated  with diabetes ([place]) -     benazepril (LOTENSIN) 10 MG tablet; Take 1 tablet (10 mg total) by mouth daily.  Dyslipidemia associated with type 2 diabetes mellitus ([facility]) -     pravastatin (PRAVACHOL) 20 MG tablet; Take 1 tablet (20 mg total) by mouth daily.    Patient Instructions       If you have lab work done today you will be contacted with  your lab results within the next 2 weeks.  If you have not heard from [place] then please contact us. The fastest way to get your results is to register for My Chart.   IF you received an x-ray today, you will receive an invoice from [facility]. Please contact [facility] at [phone] with questions or concerns regarding your invoice.   IF you received labwork today, you will receive an invoice from [facility]. Please contact [facility] at [phone] with questions or concerns regarding your invoice.   Our billing staff will not be able to assist you with questions regarding bills from these companies.  You will be contacted with the lab results as soon as they are available. The fastest way to get your results is to activate your My Chart account. Instructions are located on the last page of this paperwork. If you have not heard from [place] regarding the results in 2 weeks, please contact this office.     Hypothyroidism  Hypothyroidism is when the thyroid gland does not make enough of certain hormones (it is underactive). The thyroid gland is a small gland located in the lower front part of the neck, just in front of the windpipe (trachea). This gland makes hormones that help control how the body uses food for energy (metabolism) as well as how the heart and brain function. These hormones also play a role in keeping your bones strong. When the thyroid is underactive, it produces too little of the hormones thyroxine (T4) and triiodothyronine (T3). What are the causes? This condition may be caused by:  Hashimoto's disease. This is a  disease in which the body's disease-fighting system (immune system) attacks the thyroid gland. This is the most common cause.  Viral infections.  Pregnancy.  Certain medicines.  Birth defects.  Past radiation treatments to the head or neck for cancer.  Past treatment with radioactive iodine.  Past exposure to radiation in the environment.  Past surgical removal of part or all of the thyroid.  Problems with a gland in the center of the brain (pituitary gland).  Lack of enough iodine in the diet. What increases the risk? You are more likely to develop this condition if:  You are female.  You have a family history of thyroid conditions.  You use a medicine called lithium.  You take medicines that affect the immune system (immunosuppressants). What are the signs or symptoms? Symptoms of this condition include:  Feeling as though you have no energy (lethargy).  Not being able to tolerate cold.  Weight gain that is not explained by a change in diet or exercise habits.  Lack of appetite.  Dry skin.  Coarse hair.  Menstrual irregularity.  Slowing of thought processes.  Constipation.  Sadness or depression. How is this diagnosed? This condition may be diagnosed based on:  Your symptoms, your medical history, and a physical exam.  Blood tests. You may also have imaging tests, such as an ultrasound or MRI. How is this treated? This condition is treated with medicine that replaces the thyroid hormones that your body does not make. After you begin treatment, it may take several weeks for symptoms to go away. Follow these instructions at home:  Take over-the-counter and prescription medicines only as told by your health care provider.  If you start taking any new medicines, tell your health care provider.  Keep all follow-up visits as told by your health care provider. This is important. ? As your condition improves, your dosage of thyroid  hormone medicine may  change. ? You will need to have blood tests regularly so that your health care provider can monitor your condition. Contact a health care provider if:  Your symptoms do not get better with treatment.  You are taking thyroid replacement medicine and you: ? Sweat a lot. ? Have tremors. ? Feel anxious. ? Lose weight rapidly. ? Cannot tolerate heat. ? Have emotional swings. ? Have diarrhea. ? Feel weak. Get help right away if you have:  Chest pain.  An irregular heartbeat.  A rapid heartbeat.  Difficulty breathing. Summary  Hypothyroidism is when the thyroid gland does not make enough of certain hormones (it is underactive).  When the thyroid is underactive, it produces too little of the hormones thyroxine (T4) and triiodothyronine (T3).  The most common cause is Hashimoto's disease, a disease in which the body's disease-fighting system (immune system) attacks the thyroid gland. The condition can also be caused by viral infections, medicine, pregnancy, or past radiation treatment to the head or neck.  Symptoms may include weight gain, dry skin, constipation, feeling as though you do not have energy, and not being able to tolerate cold.  This condition is treated with medicine to replace the thyroid hormones that your body does not make. This information is not intended to replace advice given to you by your health care provider. Make sure you discuss any questions you have with your health care provider. Document Released: 03/18/2005 Document Revised: 02/28/2017 Document Reviewed: 02/26/2017 Elsevier Patient Education  2020 Elsevier Inc.      Agustina Caroli, MD [facility]

## 2019-03-18 DIAGNOSIS — I10 Essential (primary) hypertension: Secondary | ICD-10-CM | POA: Diagnosis not present

## 2019-03-18 DIAGNOSIS — Z79899 Other long term (current) drug therapy: Secondary | ICD-10-CM | POA: Diagnosis not present

## 2019-03-18 DIAGNOSIS — E038 Other specified hypothyroidism: Secondary | ICD-10-CM | POA: Diagnosis not present

## 2019-03-18 DIAGNOSIS — E782 Mixed hyperlipidemia: Secondary | ICD-10-CM | POA: Diagnosis not present

## 2019-12-30 DIAGNOSIS — Z0120 Encounter for dental examination and cleaning without abnormal findings: Secondary | ICD-10-CM | POA: Diagnosis not present

## 2020-02-08 DIAGNOSIS — M9903 Segmental and somatic dysfunction of lumbar region: Secondary | ICD-10-CM | POA: Diagnosis not present

## 2020-02-08 DIAGNOSIS — M9905 Segmental and somatic dysfunction of pelvic region: Secondary | ICD-10-CM | POA: Diagnosis not present

## 2020-02-08 DIAGNOSIS — M9900 Segmental and somatic dysfunction of head region: Secondary | ICD-10-CM | POA: Diagnosis not present

## 2020-02-08 DIAGNOSIS — M9901 Segmental and somatic dysfunction of cervical region: Secondary | ICD-10-CM | POA: Diagnosis not present

## 2020-02-10 DIAGNOSIS — M9905 Segmental and somatic dysfunction of pelvic region: Secondary | ICD-10-CM | POA: Diagnosis not present

## 2020-02-10 DIAGNOSIS — M9900 Segmental and somatic dysfunction of head region: Secondary | ICD-10-CM | POA: Diagnosis not present

## 2020-02-10 DIAGNOSIS — M9901 Segmental and somatic dysfunction of cervical region: Secondary | ICD-10-CM | POA: Diagnosis not present

## 2020-02-10 DIAGNOSIS — M9903 Segmental and somatic dysfunction of lumbar region: Secondary | ICD-10-CM | POA: Diagnosis not present

## 2020-02-11 DIAGNOSIS — M9900 Segmental and somatic dysfunction of head region: Secondary | ICD-10-CM | POA: Diagnosis not present

## 2020-02-11 DIAGNOSIS — M9901 Segmental and somatic dysfunction of cervical region: Secondary | ICD-10-CM | POA: Diagnosis not present

## 2020-02-11 DIAGNOSIS — M9903 Segmental and somatic dysfunction of lumbar region: Secondary | ICD-10-CM | POA: Diagnosis not present

## 2020-02-11 DIAGNOSIS — M9905 Segmental and somatic dysfunction of pelvic region: Secondary | ICD-10-CM | POA: Diagnosis not present

## 2020-02-14 DIAGNOSIS — M9900 Segmental and somatic dysfunction of head region: Secondary | ICD-10-CM | POA: Diagnosis not present

## 2020-02-14 DIAGNOSIS — M9901 Segmental and somatic dysfunction of cervical region: Secondary | ICD-10-CM | POA: Diagnosis not present

## 2020-02-14 DIAGNOSIS — M9903 Segmental and somatic dysfunction of lumbar region: Secondary | ICD-10-CM | POA: Diagnosis not present

## 2020-02-14 DIAGNOSIS — M9905 Segmental and somatic dysfunction of pelvic region: Secondary | ICD-10-CM | POA: Diagnosis not present

## 2020-04-25 DIAGNOSIS — Z0001 Encounter for general adult medical examination with abnormal findings: Secondary | ICD-10-CM | POA: Diagnosis not present

## 2020-04-25 DIAGNOSIS — E782 Mixed hyperlipidemia: Secondary | ICD-10-CM | POA: Diagnosis not present

## 2020-04-25 DIAGNOSIS — M7010 Bursitis, unspecified hand: Secondary | ICD-10-CM | POA: Diagnosis not present

## 2020-04-25 DIAGNOSIS — E119 Type 2 diabetes mellitus without complications: Secondary | ICD-10-CM | POA: Diagnosis not present

## 2020-06-12 DIAGNOSIS — M25512 Pain in left shoulder: Secondary | ICD-10-CM | POA: Diagnosis not present

## 2020-08-09 DIAGNOSIS — Z20822 Contact with and (suspected) exposure to covid-19: Secondary | ICD-10-CM | POA: Diagnosis not present

## 2020-11-13 DIAGNOSIS — M79642 Pain in left hand: Secondary | ICD-10-CM | POA: Diagnosis not present

## 2020-11-13 DIAGNOSIS — M79641 Pain in right hand: Secondary | ICD-10-CM | POA: Diagnosis not present

## 2020-11-13 DIAGNOSIS — E8881 Metabolic syndrome: Secondary | ICD-10-CM | POA: Diagnosis not present

## 2020-11-13 DIAGNOSIS — E039 Hypothyroidism, unspecified: Secondary | ICD-10-CM | POA: Diagnosis not present

## 2020-11-13 DIAGNOSIS — I10 Essential (primary) hypertension: Secondary | ICD-10-CM | POA: Diagnosis not present

## 2020-11-13 DIAGNOSIS — H2513 Age-related nuclear cataract, bilateral: Secondary | ICD-10-CM | POA: Diagnosis not present

## 2020-11-13 DIAGNOSIS — E119 Type 2 diabetes mellitus without complications: Secondary | ICD-10-CM | POA: Diagnosis not present

## 2020-11-13 DIAGNOSIS — E782 Mixed hyperlipidemia: Secondary | ICD-10-CM | POA: Diagnosis not present

## 2021-01-31 ENCOUNTER — Ambulatory Visit
Admission: RE | Admit: 2021-01-31 | Discharge: 2021-01-31 | Disposition: A | Discharge status: Home / Self Care | Payer: Self-pay | Source: Ambulatory Visit | Attending: Family Medicine | Admitting: Family Medicine

## 2021-01-31 ENCOUNTER — Other Ambulatory Visit: Payer: Self-pay | Admitting: Family Medicine

## 2021-01-31 ENCOUNTER — Other Ambulatory Visit: Payer: Self-pay

## 2021-01-31 DIAGNOSIS — W19XXXA Unspecified fall, initial encounter: Secondary | ICD-10-CM

## 2021-01-31 DIAGNOSIS — M25569 Pain in unspecified knee: Secondary | ICD-10-CM | POA: Diagnosis not present

## 2021-01-31 DIAGNOSIS — E782 Mixed hyperlipidemia: Secondary | ICD-10-CM | POA: Diagnosis not present

## 2021-01-31 DIAGNOSIS — E119 Type 2 diabetes mellitus without complications: Secondary | ICD-10-CM | POA: Diagnosis not present

## 2021-01-31 DIAGNOSIS — M7989 Other specified soft tissue disorders: Secondary | ICD-10-CM | POA: Diagnosis not present

## 2021-01-31 DIAGNOSIS — M25571 Pain in right ankle and joints of right foot: Secondary | ICD-10-CM | POA: Diagnosis not present

## 2021-01-31 DIAGNOSIS — R52 Pain, unspecified: Secondary | ICD-10-CM

## 2021-01-31 DIAGNOSIS — M2011 Hallux valgus (acquired), right foot: Secondary | ICD-10-CM | POA: Diagnosis not present

## 2021-07-12 DIAGNOSIS — Z760 Encounter for issue of repeat prescription: Secondary | ICD-10-CM | POA: Diagnosis not present

## 2021-09-10 ENCOUNTER — Ambulatory Visit (INDEPENDENT_AMBULATORY_CARE_PROVIDER_SITE_OTHER): Payer: Medicare Other | Admitting: Family Medicine

## 2021-09-10 ENCOUNTER — Encounter: Payer: Self-pay | Admitting: Family Medicine

## 2021-09-10 VITALS — BP 130/80 | HR 102 | Temp 98.5°F | Ht 61.0 in | Wt 125.5 lb

## 2021-09-10 DIAGNOSIS — E7800 Pure hypercholesterolemia, unspecified: Secondary | ICD-10-CM

## 2021-09-10 DIAGNOSIS — R7303 Prediabetes: Secondary | ICD-10-CM

## 2021-09-10 DIAGNOSIS — E1159 Type 2 diabetes mellitus with other circulatory complications: Secondary | ICD-10-CM | POA: Diagnosis not present

## 2021-09-10 DIAGNOSIS — E1169 Type 2 diabetes mellitus with other specified complication: Secondary | ICD-10-CM | POA: Diagnosis not present

## 2021-09-10 DIAGNOSIS — E785 Hyperlipidemia, unspecified: Secondary | ICD-10-CM | POA: Diagnosis not present

## 2021-09-10 DIAGNOSIS — I152 Hypertension secondary to endocrine disorders: Secondary | ICD-10-CM

## 2021-09-10 DIAGNOSIS — E079 Disorder of thyroid, unspecified: Secondary | ICD-10-CM

## 2021-09-10 DIAGNOSIS — M159 Polyosteoarthritis, unspecified: Secondary | ICD-10-CM

## 2021-09-10 LAB — MICROALBUMIN / CREATININE URINE RATIO
Creatinine,U: 15.9 mg/dL
Microalb Creat Ratio: 11 mg/g (ref 0.0–30.0)
Microalb, Ur: 1.8 mg/dL (ref 0.0–1.9)

## 2021-09-10 LAB — CBC WITH DIFFERENTIAL/PLATELET
Basophils Absolute: 0 10*3/uL (ref 0.0–0.1)
Basophils Relative: 0.6 % (ref 0.0–3.0)
Eosinophils Absolute: 0.1 10*3/uL (ref 0.0–0.7)
Eosinophils Relative: 1.9 % (ref 0.0–5.0)
HCT: 37.5 % (ref 36.0–46.0)
Hemoglobin: 12.8 g/dL (ref 12.0–15.0)
Lymphocytes Relative: 43.4 % (ref 12.0–46.0)
Lymphs Abs: 3.2 10*3/uL (ref 0.7–4.0)
MCHC: 34 g/dL (ref 30.0–36.0)
MCV: 87.4 fl (ref 78.0–100.0)
Monocytes Absolute: 0.5 10*3/uL (ref 0.1–1.0)
Monocytes Relative: 6.7 % (ref 3.0–12.0)
Neutro Abs: 3.5 10*3/uL (ref 1.4–7.7)
Neutrophils Relative %: 47.4 % (ref 43.0–77.0)
Platelets: 299 10*3/uL (ref 150.0–400.0)
RBC: 4.3 Mil/uL (ref 3.87–5.11)
RDW: 12.7 % (ref 11.5–15.5)
WBC: 7.3 10*3/uL (ref 4.0–10.5)

## 2021-09-10 LAB — COMPREHENSIVE METABOLIC PANEL
ALT: 18 U/L (ref 0–35)
AST: 18 U/L (ref 0–37)
Albumin: 4.6 g/dL (ref 3.5–5.2)
Alkaline Phosphatase: 59 U/L (ref 39–117)
BUN: 21 mg/dL (ref 6–23)
CO2: 26 mEq/L (ref 19–32)
Calcium: 10.3 mg/dL (ref 8.4–10.5)
Chloride: 103 mEq/L (ref 96–112)
Creatinine, Ser: 0.77 mg/dL (ref 0.40–1.20)
GFR: 78.93 mL/min (ref >60.00)
Glucose, Bld: 102 mg/dL — ABNORMAL HIGH (ref 70–99)
Potassium: 4 mEq/L (ref 3.5–5.1)
Sodium: 138 mEq/L (ref 135–145)
Total Bilirubin: 0.4 mg/dL (ref 0.2–1.2)
Total Protein: 7.6 g/dL (ref 6.0–8.3)

## 2021-09-10 LAB — LIPID PANEL
Cholesterol: 199 mg/dL (ref 0–200)
HDL: 53.8 mg/dL (ref >39.00)
NonHDL: 145.62
Total CHOL/HDL Ratio: 4
Triglycerides: 353 mg/dL — ABNORMAL HIGH (ref 0.0–149.0)
VLDL: 70.6 mg/dL — ABNORMAL HIGH (ref 0.0–40.0)

## 2021-09-10 LAB — TSH: TSH: 0.41 u[IU]/mL (ref 0.35–5.50)

## 2021-09-10 LAB — LDL CHOLESTEROL, DIRECT: Direct LDL: 122 mg/dL

## 2021-09-10 LAB — HEMOGLOBIN A1C: Hgb A1c MFr Bld: 6.6 % — ABNORMAL HIGH (ref 4.6–6.5)

## 2021-09-10 MED ORDER — MELOXICAM 7.5 MG PO TABS: 7.5000 mg | ORAL_TABLET | Freq: Every day | ORAL | 3 refills | Status: DC

## 2021-09-10 MED ORDER — PRAVASTATIN SODIUM 20 MG PO TABS: 20.0000 mg | ORAL_TABLET | Freq: Every day | ORAL | 3 refills | Status: DC

## 2021-09-10 MED ORDER — BENAZEPRIL HCL 10 MG PO TABS: 10.0000 mg | ORAL_TABLET | Freq: Every day | ORAL | 3 refills | Status: DC

## 2021-09-10 MED ORDER — LEVOTHYROXINE SODIUM 100 MCG PO TABS
100.0000 ug | ORAL_TABLET | Freq: Every morning | ORAL | 3 refills | Status: DC
Start: 2021-09-10 — End: 2022-04-30

## 2021-09-10 NOTE — Progress Notes (Signed)
New Patient Office Visit  Subjective:  Patient ID: Diana Gilmore, female    DOB: 01/24/1951  Age: 71 y.o. MRN: [id number]  CC:  Chief Complaint  Patient presents with   Establish Care    Need new pcp, would like annual check-up     HPI Diana Gilmore presents for new pt.  Meds refills. PCP closed.   Prev from [place]  HTN-Pt is on  benazipril.  Bp's running  120's/70's.  No ha/dizziness/cp/palp/edema/cough/sob.  Exercises daily  2.  HLD-pravastatin-doing ok.  Labs in [place].  3.  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  No tremors.  4.  Pain -OA ankle, fingers-meloxicam occ.  5.  Predm-working on TLC.  Pt denies DM  Past Medical History:  Diagnosis Date   Hyperlipidemia    Hypertension    Thyroid disease     Past Surgical History:  Procedure Laterality Date   THYROID SURGERY      Family History  Problem Relation Age of Onset   Hypertension Mother    Hypertension Father     Social History   Socioeconomic History   Marital status: Married    Spouse name: Not on file   Number of children: 4   Years of education: Not on file   Highest education level: Not on file  Occupational History   Not on file  Tobacco Use   Smoking status: Never   Smokeless tobacco: Never  Vaping Use   Vaping Use: Never used  Substance and Sexual Activity   Alcohol use: No    Alcohol/week: 0.0 standard drinks of alcohol   Drug use: No   Sexual activity: Yes  Other Topics Concern   Not on file  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   Social Determinants of Health   Financial Resource Strain: Not on file  Food Insecurity: Not on file  Transportation Needs: Not on file  Physical Activity: Not on file  Stress: Not on file  Social Connections: Not on file  Intimate Partner Violence: Not on file    ROS  ROS: Gen: no fever, chills  Skin: no rash, itching ENT: no ear pain, ear drainage, nasal congestion, rhinorrhea, sinus pressure, sore throat Eyes: no blurry vision,  double vision Resp: no cough, wheeze,SOB CV: no CP, palpitations, LE edema,  GI: no heartburn, n/v/d/c, abd pain GU: no dysuria, urgency, frequency, hematuria MSK: HPI Neuro: no dizziness, headache, weakness, vertigo Psych: no depression, anxiety, insomnia, SI   Objective:   Today's Vitals: BP 130/80   Pulse (!) 102   Temp 98.5 F (36.9 C) (Temporal)   Ht $R'5\' 1"'zu$  (1.549 m)   Wt 125 lb 8 oz (56.9 kg)   SpO2 97%   BMI 23.71 kg/m   Physical Exam  Gen: WDWN NAD female HEENT: NCAT, conjunctiva not injected, sclera nonicteric TM WNL B, OP moist, no exudates  NECK:  supple, no thyromegaly, no nodes, no carotid bruits CARDIAC: RRR, S1S2+, no murmur. DP 2+B LUNGS: CTAB. No wheezes ABDOMEN:  BS+, soft, NTND, No HSM, no masses EXT:  no edema MSK: heberden's nodules  NEURO: A&O x3.  CN II-XII intact.  PSYCH: normal mood. Good eye contact   Assessment & Plan:   Problem List Items Addressed This Visit       Endocrine   Thyroid disease   Relevant Medications   levothyroxine (SYNTHROID) 100 MCG tablet   Other Relevant Orders   TSH (Completed)     Musculoskeletal and Integument   Primary  osteoarthritis involving multiple joints   Relevant Medications   meloxicam (MOBIC) 7.5 MG tablet     Other   Hyperlipemia   Relevant Medications   benazepril (LOTENSIN) 10 MG tablet   pravastatin (PRAVACHOL) 20 MG tablet   Other Relevant Orders   Lipid panel (Completed)   Prediabetes   Relevant Orders   Hemoglobin A1c (Completed)   Microalbumin / creatinine urine ratio (Completed)   Other Visit Diagnoses     Hypertension associated with diabetes ([place])    -  Primary   Relevant Medications   benazepril (LOTENSIN) 10 MG tablet   pravastatin (PRAVACHOL) 20 MG tablet   Other Relevant Orders   Comprehensive metabolic panel (Completed)   CBC with Differential/Platelet (Completed)   Microalbumin / creatinine urine ratio (Completed)   Dyslipidemia associated with type 2 diabetes mellitus  (H[facility])       Relevant Medications   benazepril (LOTENSIN) 10 MG tablet   pravastatin (PRAVACHOL) 20 MG tablet     1.  Hypertension-chronic.  Well-controlled.  Renewed benazepril 10 mg daily.  Continue TLC.  Check CBC CMP microalbumin creatinine ratio 2.  Hyperlipidemia-chronic.  Unsure of control.  Continue pravastatin 20 mg daily.  Check CMP, lipids. 3.  Postsurgical hypothyroidism-chronic.  Stable on medications.  Continue Synthroid 100 mcg daily.  Check TSH 4.  Prediabetes-patient denies diabetes.  Working on [employer].  Check BMP, A1c, urine microalbumin creatinine ratio 5.  Osteoarthritis-chronic.  Stable on meloxicam.  Continue to stay active.  F/u 6 mo  Outpatient Encounter Medications as of 09/10/2021  Medication Sig   Multiple Vitamins-Minerals (CENTRUM SILVER 50+WOMEN) TABS Take 1 tablet by mouth.   [DISCONTINUED] benazepril (LOTENSIN) 10 MG tablet Take 1 tablet (10 mg total) by mouth daily.   [DISCONTINUED] Blood Glucose Monitoring Suppl (D-CARE GLUCOMETER) w/Device KIT 1 Units by Does not apply route daily.   [DISCONTINUED] levothyroxine (SYNTHROID) 100 MCG tablet Take 100 mcg by mouth every morning.   [DISCONTINUED] meloxicam (MOBIC) 7.5 MG tablet Take 7.5 mg by mouth daily.   [DISCONTINUED] valACYclovir (VALTREX) 1000 MG tablet At the first sign of a cold sore, take 2 tabs and then take another 2 tabs 12 hours later.   benazepril (LOTENSIN) 10 MG tablet Take 1 tablet (10 mg total) by mouth daily.   levothyroxine (SYNTHROID) 100 MCG tablet Take 1 tablet (100 mcg total) by mouth every morning.   meloxicam (MOBIC) 7.5 MG tablet Take 1 tablet (7.5 mg total) by mouth daily.   pravastatin (PRAVACHOL) 20 MG tablet Take 1 tablet (20 mg total) by mouth daily.   [DISCONTINUED] albuterol (PROVENTIL HFA;VENTOLIN HFA) 108 (90 Base) MCG/ACT inhaler Inhale 2 puffs into the lungs every 4 (four) hours as needed for wheezing or shortness of breath (cough, shortness of breath or wheezing.). (Patient not  taking: Reported on 12/30/2018)   [DISCONTINUED] levothyroxine (SYNTHROID) 125 MCG tablet Take 1 tablet (125 mcg total) by mouth daily before breakfast.   [DISCONTINUED] pravastatin (PRAVACHOL) 20 MG tablet Take 1 tablet (20 mg total) by mouth daily.   No facility-administered encounter medications on file as of 09/10/2021.    Follow-up: Return in about 6 months (around 03/12/2022) for htn.   Wellington Hampshire, MD

## 2021-09-10 NOTE — Patient Instructions (Signed)
Welcome to [facility] at [facility]! It was a pleasure meeting you today.  As discussed, Please schedule a 6 month follow up visit today.  PLEASE NOTE:  If you had any LAB tests please let us know if you have not heard back within a few days. You may see your results on MyChart before we have a chance to review them but we will give you a call once they are reviewed by us. If we ordered any REFERRALS today, please let us know if you have not heard from their office within the next week.  Let us know through MyChart if you are needing REFILLS, or have your pharmacy send us the request. You can also use MyChart to communicate with me or any office staff.  Please try these tips to maintain a healthy lifestyle:  Eat most of your calories during the day when you are active. Eliminate processed foods including packaged sweets (pies, cakes, cookies), reduce intake of potatoes, white bread, white pasta, and white rice. Look for whole grain options, oat flour or almond flour.  Each meal should contain half fruits/vegetables, one quarter protein, and one quarter carbs (no bigger than a computer mouse).  Cut down on sweet beverages. This includes juice, soda, and sweet tea. Also watch fruit intake, though this is a healthier sweet option, it still contains natural sugar! Limit to 3 servings daily.  Drink at least 1 glass of water with each meal and aim for at least 8 glasses per day  Exercise at least 150 minutes every week.   

## 2021-09-11 ENCOUNTER — Other Ambulatory Visit: Payer: Self-pay | Admitting: *Deleted

## 2021-09-11 DIAGNOSIS — E1169 Type 2 diabetes mellitus with other specified complication: Secondary | ICD-10-CM

## 2021-09-11 MED ORDER — PRAVASTATIN SODIUM 40 MG PO TABS: 20.0000 mg | ORAL_TABLET | Freq: Every day | ORAL | 3 refills | Status: DC

## 2021-12-19 ENCOUNTER — Telehealth: Payer: Self-pay | Admitting: Family Medicine

## 2021-12-19 NOTE — Telephone Encounter (Signed)
Copied from [place] [phone]. Topic: Medicare AWV >> Dec 19, 2021 10:33 AM Devoria Glassing wrote: Reason for CRM: Left message for patient to schedule Annual Wellness Visit.  Please schedule with Nurse Health Advisor Charlott Rakes, RN at [facility]. This appt can be telephone or office visit. Please call [phone] ask for [facility]

## 2021-12-24 ENCOUNTER — Encounter: Payer: Self-pay | Admitting: *Deleted

## 2022-01-07 ENCOUNTER — Telehealth: Payer: Self-pay | Admitting: Family Medicine

## 2022-01-07 NOTE — Telephone Encounter (Signed)
Copied from [place] [phone]. Topic: Medicare AWV >> Jan 07, 2022  2:53 PM Devoria Glassing wrote: Reason for CRM: Left message for patient to schedule Annual Wellness Visit.  Please schedule with Nurse Health Advisor Charlott Rakes, RN at [facility]. This appt can be telephone or office visit. Please call [phone] ask for [facility]

## 2022-01-07 NOTE — Telephone Encounter (Signed)
Copied from [place] [phone]. Topic: Medicare AWV >> Jan 07, 2022  2:39 PM Devoria Glassing wrote: Reason for CRM: Left message for patient to schedule Annual Wellness Visit.  Please schedule with Nurse Health Advisor Charlott Rakes, RN at [facility]. This appt can be telephone or office visit. Please call [phone] ask for [facility]

## 2022-01-29 ENCOUNTER — Ambulatory Visit (INDEPENDENT_AMBULATORY_CARE_PROVIDER_SITE_OTHER): Payer: Medicare Other | Admitting: Family Medicine

## 2022-01-29 ENCOUNTER — Encounter: Payer: Self-pay | Admitting: Family Medicine

## 2022-01-29 VITALS — BP 132/82 | HR 79 | Temp 98.3°F | Ht 61.0 in | Wt 125.5 lb

## 2022-01-29 DIAGNOSIS — R42 Dizziness and giddiness: Secondary | ICD-10-CM

## 2022-01-29 NOTE — Patient Instructions (Addendum)
Stop meloxicam  Do the eye exercises 10x each, 2x/day.    If not getting better or worse 1-2 weeks, let me know  Google some dizziness exercises

## 2022-01-29 NOTE — Progress Notes (Signed)
   Subjective:     Patient ID: Diana Gilmore, female    DOB: 20-Jan-1951, 71 y.o.   MRN: [id number]  Chief Complaint  Patient presents with   Dizziness    Off and on when taking Meloxicam     HPI  Dizziness-started 1 mo ago when on meloxicam-so stopped it and better.  Restarted and dizzy again.  Bp's running 120-130/70's.  Feels off balance. When turns over in bed, dizzy as well.   Occ HA R foot pain-was on meloxicam.  Better.  Occ pain R ear pain and itchy past few days.   Health Maintenance Due  Topic Date Due   Medicare Annual Wellness (AWV)  Never done   OPHTHALMOLOGY EXAM  06/18/2017   FOOT EXAM  08/27/2018    Past Medical History:  Diagnosis Date   Hyperlipidemia    Hypertension    Thyroid disease     Past Surgical History:  Procedure Laterality Date   THYROID SURGERY      Outpatient Medications Prior to Visit  Medication Sig Dispense Refill   benazepril (LOTENSIN) 10 MG tablet Take 1 tablet (10 mg total) by mouth daily. 90 tablet 3   levothyroxine (SYNTHROID) 100 MCG tablet Take 1 tablet (100 mcg total) by mouth every morning. 90 tablet 3   Multiple Vitamins-Minerals (CENTRUM SILVER 50+WOMEN) TABS Take 1 tablet by mouth.     pravastatin (PRAVACHOL) 20 MG tablet Take 20 mg by mouth daily.     meloxicam (MOBIC) 7.5 MG tablet Take 1 tablet (7.5 mg total) by mouth daily. 90 tablet 3   pravastatin (PRAVACHOL) 40 MG tablet Take 0.5 tablets (20 mg total) by mouth daily. 90 tablet 3   No facility-administered medications prior to visit.    Allergies  Allergen Reactions   Prednisone Other (See Comments)    dizziness   ROS neg/noncontributory except as noted HPI/below      Objective:     BP 132/82 (BP Location: Left Arm, Patient Position: Sitting, Cuff Size: Normal)   Pulse 79   Temp 98.3 F (36.8 C) (Temporal)   Ht 5\' 1"  (1.549 m)   Wt 125 lb 8 oz (56.9 kg)   SpO2 96%   BMI 23.71 kg/m  Wt Readings from Last 3 Encounters:  01/29/22 125 lb 8 oz (56.9 kg)   09/10/21 125 lb 8 oz (56.9 kg)  12/30/18 130 lb 6.4 oz (59.1 kg)    Physical Exam   Gen: WDWN NAD HEENT: NCAT, conjunctiva not injected, sclera nonicteric.  EOMI, no nystagmus TM WNL B, OP moist, no exudates  NECK:  supple, no thyromegaly, no nodes, no carotid bruits CARDIAC: RRR, S1S2+, no murmur.  EXT:  no edema MSK: no gross abnormalities.  NEURO: A&O x3.  CN II-XII intact. F-n,ram,pronator drift normal.  PSYCH: normal mood. Good eye contact     Assessment & Plan:   Problem List Items Addressed This Visit   None Visit Diagnoses     Dizziness    -  Primary      Dizziness-?bpv,meloxicam, other.  Stop meloxicam.  Do exercises(demo'd).  If worse, no change 1-2 wks, let me know,   No orders of the defined types were placed in this encounter.   Wellington Hampshire, MD

## 2022-03-04 DIAGNOSIS — H5213 Myopia, bilateral: Secondary | ICD-10-CM | POA: Diagnosis not present

## 2022-03-12 ENCOUNTER — Ambulatory Visit: Payer: Medicare Other | Admitting: Family Medicine

## 2022-03-28 ENCOUNTER — Telehealth: Payer: Self-pay | Admitting: Family Medicine

## 2022-03-28 NOTE — Telephone Encounter (Signed)
Patient states for the last 4 days she has been experiencing left side hip pain that goes down to the whole left thigh/some numbness in left hip and thigh -pain level 7 or 8.  States Pain has gotten worse each day.  Transferred to Triage.

## 2022-03-28 NOTE — Telephone Encounter (Signed)
Spoke with patient and she stated that she did not need to go to [facility] or ER. Patient stated that she has had pain for a while, she is took Meloxicam, which helped, she will keep her appointment for 04/02/22 and if pain get worse before then or if anything changes, she will go to UC.

## 2022-03-28 NOTE — Telephone Encounter (Signed)
FYI

## 2022-03-28 NOTE — Telephone Encounter (Signed)
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 The son is calling for his mother that has left side pain that goes to the whole left thigh, and she is also experiencing some numbness, needs to schedule an appt. Translation No Nurse Assessment Nurse: Wyn Quaker, RN, Marylene Land Date/Time (Eastern Time): 03/28/2022 10:33:14 AM Confirm and document reason for call. If symptomatic, describe symptoms. ---Caller stated that she has left side pain that goes into her left thigh. It is numb. These s/s started 2-3 days ago. Does the patient have any new or worsening symptoms? ---Yes Will a triage be completed? ---Yes Related visit to physician within the last 2 weeks? ---No Does the PT have any chronic conditions? (i.e. diabetes, asthma, this includes High risk factors for pregnancy, etc.) ---Yes List chronic conditions. ---thyroid, HTN, high cholesterol Is this a behavioral health or substance abuse call? ---No Guidelines Guideline Title Affirmed Question Affirmed Notes Nurse Date/Time (Eastern Time) Back Pain Numbness in groin or rectal area (i.e., loss of sensation) Dew, RN, Marylene Land 03/28/2022 10:35:11 AM PLEASE NOTE: All timestamps contained within this report are represented as [place] Standard Time. CONFIDENTIALTY NOTICE: This fax transmission is  intended only for the addressee. It contains information that is legally privileged, confidential or otherwise protected from use or disclosure. If you are not the intended recipient, you are strictly prohibited from reviewing, disclosing, copying using or disseminating any of this information or taking any action in reliance on or regarding this information. If you have received this fax in error, please notify us immediately by telephone so that we can arrange for its return to [place]. Phone: [phone], Toll-Free: [phone], Fax: [phone] Page: 2 of 2 Call Id: [id number] Disp. Time Lamount Cohen Time) Disposition Final User 03/28/2022 10:30:27 AM Send to Urgent Ruta Hinds 03/28/2022 10:30:59 AM Send to Urgent Ruta Hinds 03/28/2022 10:40:23 AM Go to ED Now Yes Wyn Quaker, RN, Marylene Land Final Disposition 03/28/2022 10:40:23 AM Go to ED Now Yes Dew, RN, Rosalyn Charters Disagree/Comply Comply Caller Understands Yes PreDisposition Call Doctor Care Advice Given Per Guideline GO TO ED NOW: * You need to be seen in the Emergency Department. * Go to the ED at ___________ Hospital. * Leave now. Drive carefully. CARE ADVICE given per Back Pain (Adult) guideline. Referrals [facility] - ED

## 2022-03-29 DIAGNOSIS — M7918 Myalgia, other site: Secondary | ICD-10-CM | POA: Diagnosis not present

## 2022-04-02 ENCOUNTER — Ambulatory Visit (INDEPENDENT_AMBULATORY_CARE_PROVIDER_SITE_OTHER): Payer: BLUE CROSS/BLUE SHIELD | Admitting: Family Medicine

## 2022-04-02 ENCOUNTER — Ambulatory Visit: Payer: BLUE CROSS/BLUE SHIELD | Admitting: Physical Therapy

## 2022-04-02 ENCOUNTER — Encounter: Payer: Self-pay | Admitting: Physical Therapy

## 2022-04-02 ENCOUNTER — Encounter: Payer: Self-pay | Admitting: Family Medicine

## 2022-04-02 VITALS — BP 154/82 | HR 60 | Temp 97.7°F | Ht 61.0 in | Wt 126.8 lb

## 2022-04-02 DIAGNOSIS — M25552 Pain in left hip: Secondary | ICD-10-CM | POA: Diagnosis not present

## 2022-04-02 DIAGNOSIS — M6281 Muscle weakness (generalized): Secondary | ICD-10-CM

## 2022-04-02 DIAGNOSIS — M5432 Sciatica, left side: Secondary | ICD-10-CM | POA: Diagnosis not present

## 2022-04-02 NOTE — Patient Instructions (Addendum)
Tylenol if needed for pain.  Can take gabapentin at bed.   Finish prednisone.  When done w/prednisone, can take ibuprofen or meloxicam.  Referring to physical therapy.  Get wrist splints for night time for numbness in hands   Consider sports medicine [place] at [facility]  [address] on the 1st floor Phone number [phone]

## 2022-04-02 NOTE — Progress Notes (Signed)
Subjective:     Patient ID: Diana Gilmore, female    DOB: November 15, 1950, 72 y.o.   MRN: [id number]  Chief Complaint  Patient presents with   Hip Pain    Pt states left hip pain started last week, pt states no recent falls, just started hurting    HPI L hip pain for 1 wk.  No injury. More L buttock and rad to upper ant thigh and pain and numbness.  Hard to sleep.  Worse to lie down.  Works at [employer] and standing/cooking a lot.  No b/b dysfunction.  Pain to move hip.  The numbness is the worst.  No dysuria.  Went to [facility] 12/29 and given prednisone and gabapentin tid(but too drowsy). Helping some.  Took 2 meloxicam and not help.   Health Maintenance Due  Topic Date Due   Medicare Annual Wellness (AWV)  Never done   Zoster Vaccines- Shingrix (1 of 2) Never done   OPHTHALMOLOGY EXAM  06/18/2017   Pneumonia Vaccine 85+ Years old (1 - PCV) Never done   FOOT EXAM  08/27/2018   COVID-19 Vaccine (3 - 2023-24 season) 11/30/2021   HEMOGLOBIN A1C  03/12/2022    Past Medical History:  Diagnosis Date   Hyperlipidemia    Hypertension    Thyroid disease     Past Surgical History:  Procedure Laterality Date   THYROID SURGERY      Outpatient Medications Prior to Visit  Medication Sig Dispense Refill   benazepril (LOTENSIN) 10 MG tablet Take 1 tablet (10 mg total) by mouth daily. 90 tablet 3   levothyroxine (SYNTHROID) 100 MCG tablet Take 1 tablet (100 mcg total) by mouth every morning. 90 tablet 3   Multiple Vitamins-Minerals (CENTRUM SILVER 50+WOMEN) TABS Take 1 tablet by mouth.     pravastatin (PRAVACHOL) 20 MG tablet Take 20 mg by mouth daily.     No facility-administered medications prior to visit.    Allergies  Allergen Reactions   Prednisone Other (See Comments)    dizziness   ROS neg/noncontributory except as noted HPI/below      Objective:     BP (!) 154/82 (BP Location: Left Arm, Patient Position: Sitting, Cuff Size: Normal)   Pulse 60   Temp 97.7 F (36.5 C)  (Temporal)   Ht 5\' 1"  (1.549 m)   Wt 126 lb 12.8 oz (57.5 kg)   SpO2 99%   BMI 23.96 kg/m  Wt Readings from Last 3 Encounters:  04/02/22 126 lb 12.8 oz (57.5 kg)  01/29/22 125 lb 8 oz (56.9 kg)  09/10/21 125 lb 8 oz (56.9 kg)    Physical Exam   Gen: WDWN NAD HEENT: NCAT, conjunctiva not injected, sclera nonicteric ABDOMEN:  BS+, soft, NTND, No HSM, no masses EXT:  no edema NWG:NFAO:  can stand on heels/toes/1 leg.  No TTP. Slight L SI tenderness. No rash.  MS 5/5 BLE.  SLR neg B.  Good ROM   no TTP hip/greater trochanter.  Pain w/int rotation.  NEURO: A&O x3.  CN II-XII intact.  PSYCH: normal mood. Good eye contact     Assessment & Plan:   Problem List Items Addressed This Visit   None Visit Diagnoses     Pain of left hip    -  Primary   Relevant Orders   Ambulatory referral to Physical Therapy   Sciatica, left side       Relevant Orders   Ambulatory referral to Physical Therapy  Pain L hip/poss sciatica L.  Some better w/Pred 20mg  bid Has today and tomorrow left.  Then tylenol/ibuprofen if needed.  Refer PT. F/u from June(needs to resch, missed 12/12)  No orders of the defined types were placed in this encounter.   Wellington Hampshire, MD

## 2022-04-02 NOTE — Therapy (Signed)
OUTPATIENT PHYSICAL THERAPY LOWER EXTREMITY EVALUATION   Patient Name: Diana Gilmore MRN: [id number] DOB:September 05, 1950, 72 y.o., female Today's Date: 04/02/2022  END OF SESSION:  PT End of Session - 04/02/22 1327     Visit Number 1    Number of Visits 16    Date for PT Re-Evaluation 05/28/22    Authorization Type BCBS medicare    Progress Note Due on Visit 1    PT Start Time 1345    PT Stop Time 7591    PT Time Calculation (min) 37 min    Activity Tolerance Patient tolerated treatment well    Behavior During Therapy WFL for tasks assessed/performed             Past Medical History:  Diagnosis Date   Hyperlipidemia    Hypertension    Thyroid disease    Past Surgical History:  Procedure Laterality Date   THYROID SURGERY     Patient Active Problem List   Diagnosis Date Noted   Primary osteoarthritis involving multiple joints 09/10/2021   Prediabetes 63/84/6659   Metabolic syndrome X 93/57/0177   Thyroid disease 08/31/2016   Hypertension 08/31/2016   Nuclear sclerotic cataract of both eyes 06/18/2016   Hyperlipemia 12/27/2015   Type 2 diabetes mellitus without complication, without long-term current use of insulin ([place]) 12/27/2015    PCP: Tawnya Crook, MD   REFERRING PROVIDER: Tawnya Crook, MD   REFERRING DIAG:  [phone] (ICD-10-CM) - Pain of left hip  M54.32 (ICD-10-CM) - Sciatica, left side    THERAPY DIAG:  Pain in left hip  Muscle weakness (generalized)  Rationale for Evaluation and Treatment: Rehabilitation  ONSET DATE: 1 week  SUBJECTIVE:   SUBJECTIVE STATEMENT: States that her pain started about a week ago during the day. States that she still works a few hours a day. States that she stood for 10-12 hours during the day for holidays. States that she has tingling and numbness in her left hip. States she has difficulty sleeping. States she tried meloxicam and that helped a little but the pain came back. States she went to the urgent care and the  prednisone helped and she is still on it.    PERTINENT HISTORY: NA PAIN:  Are you having pain? Yes: NPRS scale: 2 (on medication)/10 Pain location: left hip, buttock  Pain description: burning, numb, sharp Aggravating factors: sitting Relieving factors: standing, medication  PRECAUTIONS: None  WEIGHT BEARING RESTRICTIONS: No  FALLS:  Has patient fallen in last 6 months? No    OCCUPATION: currently working at [employer] - 20 hours a week   PLOF: Independent  PATIENT GOALS:to have less pain   OBJECTIVE:    PATIENT SURVEYS:  FOTO next session  COGNITION: Overall cognitive status: Within functional limits for tasks assessed     SENSATION: Not tested  EDEMA:  none    POSTURE: rounded shoulders, forward head, posterior pelvic tilt, and flexed trunk   PALPATION: Tenderness to palpation along left glutes/piriformis and lumbar paraspinals    Lumbar A/PROM:    04/02/2022      Flexion 25% limited      Extension 25% limited      R ROT       L ROT       R SB 25% limited     L SB 25% limited     No pain just stretching  (Blank rows = not tested)   LE Measurements Lower Extremity Right 04/02/2022 Left 04/02/2022   A/PROM MMT A/PROM  MMT  Hip Flexion WFL 4+ WFL 3+  Hip Extension      Hip Abduction      Hip Adduction      Hip Internal rotation      Hip External rotation 45  45*   Knee Flexion 30 4+ 15* 3+  Knee Extension  4+  3+  Ankle Dorsiflexion  4+  3+  Ankle Plantarflexion      Ankle Inversion      Ankle Eversion       (Blank rows = not tested) * pain   LOWER EXTREMITY SPECIAL TESTS:  Thomas test + B    TODAY'S TREATMENT:                                                                                                                              DATE: 04/02/2022  Therapeutic Exercise:  Aerobic: Supine:piriformis stretch IR/ER B x2 30" holds Each Prone: lying 2 minutes, hamstring curls x10 B  Seated:  Standing: Neuromuscular  Re-education: Manual Therapy: STM with percussion gun to left glutes/piriformis with towel - tolerated well  Therapeutic Activity: Self Care: Trigger Point Dry Needling:  Modalities:     PATIENT EDUCATION:  Education details: on current presentation, on HEP, on clinical outcomes score and POC Person educated: Patient Education method: Programmer, multimedia, Demonstration, and Handouts Education comprehension: verbalized understanding  HOME EXERCISE PROGRAM: [id number]  ASSESSMENT:  CLINICAL IMPRESSION: Patient presents with symptoms consistent with left piriformis syndrome and demonstrates weakness and ROM deficits associated with this. Educated patient in current presentation and POC moving forwarded. Tolerated session well and reported less pain end of session. Patient would greatly benefit from skilled PT to improve overall QOL and function.  OBJECTIVE IMPAIRMENTS: decreased activity tolerance, decreased ROM, decreased strength, postural dysfunction, and pain.   ACTIVITY LIMITATIONS: sitting, standing, squatting, sleeping, transfers, and locomotion level  PARTICIPATION LIMITATIONS: meal prep, cleaning, and occupation  PERSONAL FACTORS: Age and Fitness are also affecting patient's functional outcome.   REHAB POTENTIAL: Good  CLINICAL DECISION MAKING: Stable/uncomplicated  EVALUATION COMPLEXITY: Low   GOALS: Goals reviewed with patient? yes  SHORT TERM GOALS: Target date: 04/30/2022  Patient will be independent in self management strategies to improve quality of life and functional outcomes. Baseline: New Program Goal status: INITIAL  2.  Patient will report at least 50% improvement in overall symptoms and/or function to demonstrate improved functional mobility Baseline: 0% better Goal status: INITIAL  3.  Patient will demonstrate at least 4/5 MMT in B LEs Baseline:  Goal status: INITIAL      LONG TERM GOALS: Target date: 05/28/2022   Patient will report at least 75%  improvement in overall symptoms and/or function to demonstrate improved functional mobility Baseline: 0% better Goal status: INITIAL  2.  Patient will be able to demonstrate painfree hip ROM Baseline: see above Goal status: INITIAL  3.  Patient will be able to stand and sit without pain to improve sitting/standing tolerance. Baseline:  Goal status: INITIAL      PLAN:  PT FREQUENCY: 2x/week  PT DURATION: 8 weeks  PLANNED INTERVENTIONS: Therapeutic exercises, Therapeutic activity, Neuromuscular re-education, Balance training, Gait training, Patient/Family education, Self Care, Joint mobilization, Joint manipulation, Stair training, Orthotic/Fit training, Aquatic Therapy, Dry Needling, Electrical stimulation, Spinal manipulation, Spinal mobilization, Cryotherapy, Moist heat, Taping, Traction, Ultrasound, Ionotophoresis 4mg /ml Dexamethasone, Manual therapy, and Re-evaluation  PLAN FOR NEXT SESSION: do FOTO, hip ROM, STM, lumbar ROM, MMT  2:34 PM, 04/02/22 Jerene Pitch, DPT Physical Therapy with [facility]

## 2022-04-02 NOTE — Therapy (Unsigned)
OUTPATIENT PHYSICAL THERAPY TREATMENT NOTE   Patient Name: Diana Gilmore MRN: [id number] DOB:12-29-1950, 72 y.o., female Today's Date: 04/04/2022  PCP: Tawnya Crook, MD   REFERRING PROVIDER: Tawnya Crook, MD   END OF SESSION:   PT End of Session - 04/04/22 1346     Visit Number 2    Number of Visits 16    Date for PT Re-Evaluation 05/28/22    Authorization Type BCBS medicare    Progress Note Due on Visit 10    PT Start Time 5366    PT Stop Time 1427    PT Time Calculation (min) 38 min    Activity Tolerance Patient tolerated treatment well    Behavior During Therapy [facility] for tasks assessed/performed             Past Medical History:  Diagnosis Date   Hyperlipidemia    Hypertension    Thyroid disease    Past Surgical History:  Procedure Laterality Date   THYROID SURGERY     Patient Active Problem List   Diagnosis Date Noted   Primary osteoarthritis involving multiple joints 09/10/2021   Prediabetes 44/05/4740   Metabolic syndrome X 59/56/3875   Thyroid disease 08/31/2016   Hypertension 08/31/2016   Nuclear sclerotic cataract of both eyes 06/18/2016   Hyperlipemia 12/27/2015   Type 2 diabetes mellitus without complication, without long-term current use of insulin ([facility]) 12/27/2015    THERAPY DIAG:  Pain in left hip  Muscle weakness (generalized)   REFERRING DIAG:  M25.552 (ICD-10-CM) - Pain of left hip  M54.32 (ICD-10-CM) - Sciatica, left side    THERAPY DIAG:  Pain in left hip  Muscle weakness (generalized)  Rationale for Evaluation and Treatment: Rehabilitation  ONSET DATE: 1 week  SUBJECTIVE:   SUBJECTIVE STATEMENT: 04/04/2022 States that she still has some pain but she has some numbness and pain is still worse at night.   Eval: States that her pain started about a week ago during the day. States that she still works a few hours a day. States that she stood for 10-12 hours during the day for holidays. States that she has tingling and  numbness in her left hip. States she has difficulty sleeping. States she tried meloxicam and that helped a little but the pain came back. States she went to the urgent care and the prednisone helped and she is still on it.    PERTINENT HISTORY: NA PAIN:  Are you having pain? Yes: NPRS scale: 6 (not on medication)/10 Pain location: left anterior hip, buttock  Pain description: burning, numb, sharp Aggravating factors: sitting Relieving factors: standing, medication  PRECAUTIONS: None  WEIGHT BEARING RESTRICTIONS: No  FALLS:  Has patient fallen in last 6 months? No    OCCUPATION: currently working at [employer] - 20 hours a week   PLOF: Independent  PATIENT GOALS:to have less pain   OBJECTIVE:    PATIENT SURVEYS:  FOTO next session  COGNITION: Overall cognitive status: Within functional limits for tasks assessed     SENSATION: Not tested  EDEMA:  none    POSTURE: rounded shoulders, forward head, posterior pelvic tilt, and flexed trunk   PALPATION: Tenderness to palpation along left glutes/piriformis and lumbar paraspinals    Lumbar A/PROM:    04/02/2022      Flexion 25% limited      Extension 25% limited      R ROT       L ROT       R SB 25%  limited     L SB 25% limited     No pain just stretching  (Blank rows = not tested)   LE Measurements Lower Extremity Right 04/02/2022 Left 04/02/2022   A/PROM MMT A/PROM MMT  Hip Flexion WFL 4+ WFL 3+  Hip Extension      Hip Abduction      Hip Adduction      Hip Internal rotation      Hip External rotation 45  45*   Knee Flexion 30 4+ 15* 3+  Knee Extension  4+  3+  Ankle Dorsiflexion  4+  3+  Ankle Plantarflexion      Ankle Inversion      Ankle Eversion       (Blank rows = not tested) * pain   LOWER EXTREMITY SPECIAL TESTS:  Maisie Fus test + B    TODAY'S TREATMENT:                                                                                                                              DATE:  04/04/2022 Therapeutic Exercise:  Aerobic: Supine:ITB strap stretch x5 10" holds B, LTR 2 minutes, hip add iss 2 minutes 5" holds, hip IR 2 minutes with 5" holds B Prone: lying 2 minutes, hamstring curls x10 B, prone on elbows x5 15" holds   Seated:  Standing: Neuromuscular Re-education: Manual Therapy: STM to left quads and lumbar paraspinals. Trialed leg traction - pain stopped, lumbar traction on ball - felt good - 6  minutes,  Therapeutic Activity: Self Care: Trigger Point Dry Needling:  Modalities:     PATIENT EDUCATION:  Education details: on current presentation, on HEP, on clinical outcomes score and POC Person educated: Patient Education method: Explanation, Demonstration, and Handouts Education comprehension: verbalized understanding  HOME EXERCISE PROGRAM: [id number]  ASSESSMENT:  CLINICAL IMPRESSION: 04/04/2022 Focused on continued manual work which was tolerated well. Progressed exercises which were tolerated well. Reviewed with patient and provided hand outs for HEP adherence. Reduced numbness and pain noted end of session. Left hip very restrictive with midline movements (IR/ADD). Will continue with current POC as tolerated.   Eval:Patient presents with symptoms consistent with left piriformis syndrome and demonstrates weakness and ROM deficits associated with this. Educated patient in current presentation and POC moving forwarded. Tolerated session well and reported less pain end of session. Patient would greatly benefit from skilled PT to improve overall QOL and function.  OBJECTIVE IMPAIRMENTS: decreased activity tolerance, decreased ROM, decreased strength, postural dysfunction, and pain.   ACTIVITY LIMITATIONS: sitting, standing, squatting, sleeping, transfers, and locomotion level  PARTICIPATION LIMITATIONS: meal prep, cleaning, and occupation  PERSONAL FACTORS: Age and Fitness are also affecting patient's functional outcome.   REHAB POTENTIAL: Good  CLINICAL  DECISION MAKING: Stable/uncomplicated  EVALUATION COMPLEXITY: Low   GOALS: Goals reviewed with patient? yes  SHORT TERM GOALS: Target date: 04/30/2022  Patient will be independent in self management strategies to improve quality of life and  functional outcomes. Baseline: New Program Goal status: INITIAL  2.  Patient will report at least 50% improvement in overall symptoms and/or function to demonstrate improved functional mobility Baseline: 0% better Goal status: INITIAL  3.  Patient will demonstrate at least 4/5 MMT in B LEs Baseline:  Goal status: INITIAL      LONG TERM GOALS: Target date: 05/28/2022   Patient will report at least 75% improvement in overall symptoms and/or function to demonstrate improved functional mobility Baseline: 0% better Goal status: INITIAL  2.  Patient will be able to demonstrate painfree hip ROM Baseline: see above Goal status: INITIAL  3.  Patient will be able to stand and sit without pain to improve sitting/standing tolerance. Baseline:  Goal status: INITIAL      PLAN:  PT FREQUENCY: 2x/week  PT DURATION: 8 weeks  PLANNED INTERVENTIONS: Therapeutic exercises, Therapeutic activity, Neuromuscular re-education, Balance training, Gait training, Patient/Family education, Self Care, Joint mobilization, Joint manipulation, Stair training, Orthotic/Fit training, Aquatic Therapy, Dry Needling, Electrical stimulation, Spinal manipulation, Spinal mobilization, Cryotherapy, Moist heat, Taping, Traction, Ultrasound, Ionotophoresis 4mg /ml Dexamethasone, Manual therapy, and Re-evaluation  PLAN FOR NEXT SESSION: do FOTO, hip ROM, STM, lumbar ROM, MMT  2:33 PM, 04/04/22 Jerene Pitch, DPT Physical Therapy with [facility]

## 2022-04-04 ENCOUNTER — Encounter: Payer: Self-pay | Admitting: Physical Therapy

## 2022-04-04 ENCOUNTER — Ambulatory Visit: Payer: BLUE CROSS/BLUE SHIELD | Admitting: Physical Therapy

## 2022-04-04 DIAGNOSIS — M25552 Pain in left hip: Secondary | ICD-10-CM

## 2022-04-04 DIAGNOSIS — M6281 Muscle weakness (generalized): Secondary | ICD-10-CM

## 2022-04-09 ENCOUNTER — Encounter: Payer: BLUE CROSS/BLUE SHIELD | Admitting: Physical Therapy

## 2022-04-09 ENCOUNTER — Telehealth: Payer: Self-pay | Admitting: Family Medicine

## 2022-04-09 ENCOUNTER — Other Ambulatory Visit: Payer: Self-pay | Admitting: Family Medicine

## 2022-04-09 MED ORDER — CELECOXIB 200 MG PO CAPS: 200.0000 mg | ORAL_CAPSULE | Freq: Two times a day (BID) | ORAL | 1 refills | Status: DC

## 2022-04-09 NOTE — Telephone Encounter (Signed)
Patient stated that she has been taking Meloxicam, but doesn't think it is helping anymore. Patient stated that pain is making it hard for her to work and sleep at night.

## 2022-04-09 NOTE — Telephone Encounter (Signed)
Please see message below and advise.

## 2022-04-09 NOTE — Telephone Encounter (Signed)
Pt states she has had appts with Dr Cherlynn Kaiser for the issues of hip pain and pt would like pain meds sent to pharmacy. Please advise and call pt back with details.

## 2022-04-10 NOTE — Telephone Encounter (Signed)
Noted  

## 2022-04-11 ENCOUNTER — Ambulatory Visit: Payer: BLUE CROSS/BLUE SHIELD | Admitting: Physical Therapy

## 2022-04-11 ENCOUNTER — Encounter: Payer: Self-pay | Admitting: Physical Therapy

## 2022-04-11 DIAGNOSIS — M25552 Pain in left hip: Secondary | ICD-10-CM

## 2022-04-11 DIAGNOSIS — M6281 Muscle weakness (generalized): Secondary | ICD-10-CM

## 2022-04-11 NOTE — Therapy (Signed)
OUTPATIENT PHYSICAL THERAPY TREATMENT NOTE   Patient Name: Jerelene Salaam MRN: [id number] DOB:1950/11/02, 72 y.o., female Today's Date: 04/11/2022  PCP: Jeani Sow, MD   REFERRING PROVIDER: Jeani Sow, MD   END OF SESSION:   PT End of Session - 04/11/22 1309     Visit Number 3    Number of Visits 16    Date for PT Re-Evaluation 05/28/22    Authorization Type [employer]BS medicare    Progress Note Due on Visit 10    PT Start Time 1310   late to apt   PT Stop Time 1345    PT Time Calculation (min) 35 min    Activity Tolerance Patient tolerated treatment well    Behavior During Therapy [facility] for tasks assessed/performed             Past Medical History:  Diagnosis Date   Hyperlipidemia    Hypertension    Thyroid disease    Past Surgical History:  Procedure Laterality Date   THYROID SURGERY     Patient Active Problem List   Diagnosis Date Noted   Primary osteoarthritis involving multiple joints 09/10/2021   Prediabetes 09/10/2021   Metabolic syndrome X 12/07/2016   Thyroid disease 08/31/2016   Hypertension 08/31/2016   Nuclear sclerotic cataract of both eyes 06/18/2016   Hyperlipemia 12/27/2015   Type 2 diabetes mellitus without complication, without long-term current use of insulin (HCC) 12/27/2015    THERAPY DIAG:  Pain in left hip  Muscle weakness (generalized)   REFERRING DIAG:  M25.552 (ICD-10-CM) - Pain of left hip  M54.32 (ICD-10-CM) - Sciatica, left side    THERAPY DIAG:  Pain in left hip  Muscle weakness (generalized)  Rationale for Evaluation and Treatment: Rehabilitation  ONSET DATE: 1 week  SUBJECTIVE:   SUBJECTIVE STATEMENT: 04/11/2022 States that she continues to have numbness in her left groin. States she finished her medication on Friday and since then her pain has been worse  Eval: States that her pain started about a week ago during the day. States that she still works a few hours a day. States that she stood for 10-12 hours  during the day for holidays. States that she has tingling and numbness in her left hip. States she has difficulty sleeping. States she tried meloxicam and that helped a little but the pain came back. States she went to the urgent care and the prednisone helped and she is still on it.    PERTINENT HISTORY: NA PAIN:  Are you having pain? Yes: NPRS scale: 7/10 Pain location: left anterior hip, buttock  Pain description: burning, numb, sharp Aggravating factors: sitting Relieving factors: standing, medication  PRECAUTIONS: None  WEIGHT BEARING RESTRICTIONS: No  FALLS:  Has patient fallen in last 6 months? No    OCCUPATION: currently working at [employer] - 20 hours a week   PLOF: Independent  PATIENT GOALS:to have less pain   OBJECTIVE:    PATIENT SURVEYS:  FOTO next session  COGNITION: Overall cognitive status: Within functional limits for tasks assessed     SENSATION: Not tested  EDEMA:  none    POSTURE: rounded shoulders, forward head, posterior pelvic tilt, and flexed trunk   PALPATION: Tenderness to palpation along left glutes/piriformis and lumbar paraspinals    Lumbar A/PROM:    04/02/2022      Flexion 25% limited      Extension 25% limited      R ROT       L ROT  R SB 25% limited     L SB 25% limited     No pain just stretching  (Blank rows = not tested)   LE Measurements Lower Extremity Right 04/02/2022 Left 04/02/2022   A/PROM MMT A/PROM MMT  Hip Flexion WFL 4+ WFL 3+  Hip Extension      Hip Abduction      Hip Adduction      Hip Internal rotation 30  15*   Hip External rotation 45  45*   Knee Flexion  4+  3+  Knee Extension  4+  3+  Ankle Dorsiflexion  4+  3+  Ankle Plantarflexion      Ankle Inversion      Ankle Eversion       (Blank rows = not tested) * pain   LOWER EXTREMITY SPECIAL TESTS:  Marcello Moores test + B    TODAY'S TREATMENT:                                                                                                                               DATE: 04/11/2022 Therapeutic Exercise:  Aerobic: Supine:thomas stretch x5 45" holds L, piriformis stretch into ER x3 30" holds B then into IR x3 30" holds, bent knee fall outs with long hold for stretch 2 minutes total    Prone: left hip PROM into add/IR/ER/Extension 8 minutes  Seated:  Standing: Neuromuscular Re-education: Manual Therapy: STM to left  hip ER, lateral hamstrings with PA to hip tolerated well - improved ROM noted Therapeutic Activity: Self Care: Trigger Point Dry Needling:  Modalities: thermotherapy to lumbar/pelvis during prone exercises     PATIENT EDUCATION:  Education details: on HEP Person educated: Patient Education method: Consulting civil engineer, Media planner, and Handouts Education comprehension: verbalized understanding  HOME EXERCISE PROGRAM: [id number]  ASSESSMENT:  CLINICAL IMPRESSION: 04/11/2022 Session focused on hip ROM which was tolerated well. Left hip ER very tight and painful to palpation and with STM. Tolerated PROM and hip mobilizations well. Improved hip IR and knee flexion in prone noted afterwards. Discussed importance of daily stretching to help with current restrictions in left hip. Will continue with current POC as tolerated.   Eval:Patient presents with symptoms consistent with left piriformis syndrome and demonstrates weakness and ROM deficits associated with this. Educated patient in current presentation and POC moving forwarded. Tolerated session well and reported less pain end of session. Patient would greatly benefit from skilled PT to improve overall QOL and function.  OBJECTIVE IMPAIRMENTS: decreased activity tolerance, decreased ROM, decreased strength, postural dysfunction, and pain.   ACTIVITY LIMITATIONS: sitting, standing, squatting, sleeping, transfers, and locomotion level  PARTICIPATION LIMITATIONS: meal prep, cleaning, and occupation  PERSONAL FACTORS: Age and Fitness are also affecting patient's  functional outcome.   REHAB POTENTIAL: Good  CLINICAL DECISION MAKING: Stable/uncomplicated  EVALUATION COMPLEXITY: Low   GOALS: Goals reviewed with patient? yes  SHORT TERM GOALS: Target date: 04/30/2022  Patient will be independent in self management strategies to  improve quality of life and functional outcomes. Baseline: New Program Goal status: INITIAL  2.  Patient will report at least 50% improvement in overall symptoms and/or function to demonstrate improved functional mobility Baseline: 0% better Goal status: INITIAL  3.  Patient will demonstrate at least 4/5 MMT in B LEs Baseline:  Goal status: INITIAL      LONG TERM GOALS: Target date: 05/28/2022   Patient will report at least 75% improvement in overall symptoms and/or function to demonstrate improved functional mobility Baseline: 0% better Goal status: INITIAL  2.  Patient will be able to demonstrate painfree hip ROM Baseline: see above Goal status: INITIAL  3.  Patient will be able to stand and sit without pain to improve sitting/standing tolerance. Baseline:  Goal status: INITIAL      PLAN:  PT FREQUENCY: 2x/week  PT DURATION: 8 weeks  PLANNED INTERVENTIONS: Therapeutic exercises, Therapeutic activity, Neuromuscular re-education, Balance training, Gait training, Patient/Family education, Self Care, Joint mobilization, Joint manipulation, Stair training, Orthotic/Fit training, Aquatic Therapy, Dry Needling, Electrical stimulation, Spinal manipulation, Spinal mobilization, Cryotherapy, Moist heat, Taping, Traction, Ultrasound, Ionotophoresis 4mg /ml Dexamethasone, Manual therapy, and Re-evaluation  PLAN FOR NEXT SESSION: hip ROM, STM, lumbar ROM, MMT  1:47 PM, 04/11/22 Jerene Pitch, DPT Physical Therapy with [facility]

## 2022-04-15 NOTE — Therapy (Unsigned)
OUTPATIENT PHYSICAL THERAPY TREATMENT NOTE   Patient Name: Diana Gilmore MRN: [id number] DOB:Oct 15, 1950, 72 y.o., female Today's Date: 04/16/2022  PCP: Tawnya Crook, MD   REFERRING PROVIDER: Tawnya Crook, MD   END OF SESSION:   PT End of Session - 04/16/22 1532     Visit Number 4    Number of Visits 16    Date for PT Re-Evaluation 05/28/22    Authorization Type [employer]BS medicare    Progress Note Due on Visit 10    PT Start Time 1343    PT Stop Time 1430    PT Time Calculation (min) 47 min    Activity Tolerance Patient tolerated treatment well    Behavior During Therapy [facility] for tasks assessed/performed              Past Medical History:  Diagnosis Date   Hyperlipidemia    Hypertension    Thyroid disease    Past Surgical History:  Procedure Laterality Date   THYROID SURGERY     Patient Active Problem List   Diagnosis Date Noted   Primary osteoarthritis involving multiple joints 09/10/2021   Prediabetes 24/58/0998   Metabolic syndrome X 33/82/5053   Thyroid disease 08/31/2016   Hypertension 08/31/2016   Nuclear sclerotic cataract of both eyes 06/18/2016   Hyperlipemia 12/27/2015   Type 2 diabetes mellitus without complication, without long-term current use of insulin ([place]) 12/27/2015    THERAPY DIAG:  Pain in left hip  Muscle weakness (generalized)   REFERRING DIAG:  M25.552 (ICD-10-CM) - Pain of left hip  M54.32 (ICD-10-CM) - Sciatica, left side    THERAPY DIAG:  Pain in left hip  Muscle weakness (generalized)  Rationale for Evaluation and Treatment: Rehabilitation  ONSET DATE: 1 week  SUBJECTIVE:   SUBJECTIVE STATEMENT: 04/16/2022 States that she continues to have numbness in her left groin. She states that PT has been helpful.   Eval: States that her pain started about a week ago during the day. States that she still works a few hours a day. States that she stood for 10-12 hours during the day for holidays. States that she has tingling and  numbness in her left hip. States she has difficulty sleeping. States she tried meloxicam and that helped a little but the pain came back. States she went to the urgent care and the prednisone helped and she is still on it.    PERTINENT HISTORY: NA PAIN:  Are you having pain? Yes: NPRS scale: 7/10 Pain location: left anterior hip, buttock  Pain description: burning, numb, sharp Aggravating factors: sitting Relieving factors: standing, medication  PRECAUTIONS: None  WEIGHT BEARING RESTRICTIONS: No  FALLS:  Has patient fallen in last 6 months? No    OCCUPATION: currently working at [employer] - 20 hours a week   PLOF: Independent  PATIENT GOALS:to have less pain   OBJECTIVE:    PATIENT SURVEYS:  FOTO next session  COGNITION: Overall cognitive status: Within functional limits for tasks assessed     SENSATION: Not tested  EDEMA:  none    POSTURE: rounded shoulders, forward head, posterior pelvic tilt, and flexed trunk   PALPATION: Tenderness to palpation along left glutes/piriformis and lumbar paraspinals    Lumbar A/PROM:    04/02/2022      Flexion 25% limited      Extension 25% limited      R ROT       L ROT       R SB 25% limited  L SB 25% limited     No pain just stretching  (Blank rows = not tested)   LE Measurements Lower Extremity Right 04/02/2022 Left 04/02/2022   A/PROM MMT A/PROM MMT  Hip Flexion WFL 4+ WFL 3+  Hip Extension      Hip Abduction      Hip Adduction      Hip Internal rotation 30  15*   Hip External rotation 45  45*   Knee Flexion  4+  3+  Knee Extension  4+  3+  Ankle Dorsiflexion  4+  3+  Ankle Plantarflexion      Ankle Inversion      Ankle Eversion       (Blank rows = not tested) * pain   LOWER EXTREMITY SPECIAL TESTS:  Marcello Moores test + B    TODAY'S TREATMENT:   Date:  04/16/2022 Therapeutic Exercise:  Aerobic: Supine:thomas stretch x5 45" holds L, piriformis stretch into ER x3 30" holds B then into IR x3 30"  holds, bent knee fall outs with long hold for stretch 2 minutes total, bridges x10.   Prone: left hip PROM into add/IR/ER/Extension 8 minutes  Seated:  Standing: Neuromuscular Re-education: Manual Therapy: Theragun to hip flexor, IT band, Inferior and lateral hip mobs. STM to hip flexor.  Therapeutic Activity:  Self Care: Trigger Point Dry Needling:  Modalities: moist heat to L hip flexor.                                                                                                                               DATE: 04/16/2022 Therapeutic Exercise:  Aerobic: Supine:thomas stretch x5 45" holds L, piriformis stretch into ER x3 30" holds B then into IR x3 30" holds, bent knee fall outs with long hold for stretch 2 minutes total    Prone: left hip PROM into add/IR/ER/Extension 8 minutes  Seated:  Standing: Neuromuscular Re-education: Manual Therapy: STM to left  hip ER, lateral hamstrings with PA to hip tolerated well - improved ROM noted Therapeutic Activity: Self Care: Trigger Point Dry Needling:  Modalities: thermotherapy to lumbar/pelvis during prone exercises     PATIENT EDUCATION:  Education details: on HEP Person educated: Patient Education method: Consulting civil engineer, Media planner, and Handouts Education comprehension: verbalized understanding  HOME EXERCISE PROGRAM: [id number]  ASSESSMENT:  CLINICAL IMPRESSION: Session focused on hip ROM which was tolerated well. She continues to demonstrate limitations in her L hip > R. Tolerated manual today with improvements in pain and muscle tension following. Moist heat applied to L hip at end of session today, pt encouraged to use heat and not ice prior to going to bed. Also discussed different sleeping positions to help alleviate pain associated with sleeping. Will continue with current POC as tolerated.   Eval:Patient presents with symptoms consistent with left piriformis syndrome and demonstrates weakness and ROM deficits associated  with this. Educated patient in current presentation and POC moving forwarded. Tolerated session well and reported  less pain end of session. Patient would greatly benefit from skilled PT to improve overall QOL and function.  OBJECTIVE IMPAIRMENTS: decreased activity tolerance, decreased ROM, decreased strength, postural dysfunction, and pain.   ACTIVITY LIMITATIONS: sitting, standing, squatting, sleeping, transfers, and locomotion level  PARTICIPATION LIMITATIONS: meal prep, cleaning, and occupation  PERSONAL FACTORS: Age and Fitness are also affecting patient's functional outcome.   REHAB POTENTIAL: Good  CLINICAL DECISION MAKING: Stable/uncomplicated  EVALUATION COMPLEXITY: Low   GOALS: Goals reviewed with patient? yes  SHORT TERM GOALS: Target date: 04/30/2022  Patient will be independent in self management strategies to improve quality of life and functional outcomes. Baseline: New Program Goal status: INITIAL  2.  Patient will report at least 50% improvement in overall symptoms and/or function to demonstrate improved functional mobility Baseline: 0% better Goal status: INITIAL  3.  Patient will demonstrate at least 4/5 MMT in B LEs Baseline:  Goal status: INITIAL      LONG TERM GOALS: Target date: 05/28/2022   Patient will report at least 75% improvement in overall symptoms and/or function to demonstrate improved functional mobility Baseline: 0% better Goal status: INITIAL  2.  Patient will be able to demonstrate painfree hip ROM Baseline: see above Goal status: INITIAL  3.  Patient will be able to stand and sit without pain to improve sitting/standing tolerance. Baseline:  Goal status: INITIAL      PLAN:  PT FREQUENCY: 2x/week  PT DURATION: 8 weeks  PLANNED INTERVENTIONS: Therapeutic exercises, Therapeutic activity, Neuromuscular re-education, Balance training, Gait training, Patient/Family education, Self Care, Joint mobilization, Joint manipulation,  Stair training, Orthotic/Fit training, Aquatic Therapy, Dry Needling, Electrical stimulation, Spinal manipulation, Spinal mobilization, Cryotherapy, Moist heat, Taping, Traction, Ultrasound, Ionotophoresis 4mg /ml Dexamethasone, Manual therapy, and Re-evaluation  PLAN FOR NEXT SESSION: hip ROM, STM, lumbar ROM, MMT  PT, DPT 04/16/22  3:33 PM

## 2022-04-16 ENCOUNTER — Ambulatory Visit (INDEPENDENT_AMBULATORY_CARE_PROVIDER_SITE_OTHER): Payer: BLUE CROSS/BLUE SHIELD | Admitting: Physical Therapy

## 2022-04-16 DIAGNOSIS — M25552 Pain in left hip: Secondary | ICD-10-CM

## 2022-04-16 DIAGNOSIS — M6281 Muscle weakness (generalized): Secondary | ICD-10-CM | POA: Diagnosis not present

## 2022-04-18 ENCOUNTER — Ambulatory Visit (INDEPENDENT_AMBULATORY_CARE_PROVIDER_SITE_OTHER): Payer: Medicare Other | Admitting: Physical Therapy

## 2022-04-18 DIAGNOSIS — M6281 Muscle weakness (generalized): Secondary | ICD-10-CM

## 2022-04-18 DIAGNOSIS — M25552 Pain in left hip: Secondary | ICD-10-CM

## 2022-04-18 NOTE — Therapy (Signed)
OUTPATIENT PHYSICAL THERAPY TREATMENT NOTE   Patient Name: Diana Gilmore MRN: [id number] DOB:June 08, 1950, 72 y.o., female Today's Date: 04/18/2022  PCP: Tawnya Crook, MD   REFERRING PROVIDER: Tawnya Crook, MD   END OF SESSION:      Past Medical History:  Diagnosis Date   Hyperlipidemia    Hypertension    Thyroid disease    Past Surgical History:  Procedure Laterality Date   THYROID SURGERY     Patient Active Problem List   Diagnosis Date Noted   Primary osteoarthritis involving multiple joints 09/10/2021   Prediabetes 67/20/9470   Metabolic syndrome X 96/28/3662   Thyroid disease 08/31/2016   Hypertension 08/31/2016   Nuclear sclerotic cataract of both eyes 06/18/2016   Hyperlipemia 12/27/2015   Type 2 diabetes mellitus without complication, without long-term current use of insulin ([place]) 12/27/2015    THERAPY DIAG:  Pain in left hip  Muscle weakness (generalized)   REFERRING DIAG:  M25.552 (ICD-10-CM) - Pain of left hip  M54.32 (ICD-10-CM) - Sciatica, left side    THERAPY DIAG:  Pain in left hip  Muscle weakness (generalized)  Rationale for Evaluation and Treatment: Rehabilitation  ONSET DATE: 1 week  SUBJECTIVE:   SUBJECTIVE STATEMENT: 04/18/2022 States that she continues to have numbness in her left groin. She states that PT has been helpful.   Eval: States that her pain started about a week ago during the day. States that she still works a few hours a day. States that she stood for 10-12 hours during the day for holidays. States that she has tingling and numbness in her left hip. States she has difficulty sleeping. States she tried meloxicam and that helped a little but the pain came back. States she went to the urgent care and the prednisone helped and she is still on it.    PERTINENT HISTORY: NA PAIN:  Are you having pain? Yes: NPRS scale: 7/10 Pain location: left anterior hip, buttock  Pain description: burning, numb, sharp Aggravating  factors: sitting Relieving factors: standing, medication  PRECAUTIONS: None  WEIGHT BEARING RESTRICTIONS: No  FALLS:  Has patient fallen in last 6 months? No    OCCUPATION: currently working at [employer] - 20 hours a week   PLOF: Independent  PATIENT GOALS:to have less pain   OBJECTIVE:    PATIENT SURVEYS:  FOTO next session  COGNITION: Overall cognitive status: Within functional limits for tasks assessed     SENSATION: Not tested  EDEMA:  none    POSTURE: rounded shoulders, forward head, posterior pelvic tilt, and flexed trunk   PALPATION: Tenderness to palpation along left glutes/piriformis and lumbar paraspinals    Lumbar A/PROM:    04/02/2022      Flexion 25% limited      Extension 25% limited      R ROT       L ROT       R SB 25% limited     L SB 25% limited     No pain just stretching  (Blank rows = not tested)   LE Measurements Lower Extremity Right 04/02/2022 Left 04/02/2022   A/PROM MMT A/PROM MMT  Hip Flexion WFL 4+ WFL 3+  Hip Extension      Hip Abduction      Hip Adduction      Hip Internal rotation 30  15*   Hip External rotation 45  45*   Knee Flexion  4+  3+  Knee Extension  4+  3+  Ankle Dorsiflexion  4+  3+  Ankle Plantarflexion      Ankle Inversion      Ankle Eversion       (Blank rows = not tested) * pain   LOWER EXTREMITY SPECIAL TESTS:  Marcello Moores test + B    TODAY'S TREATMENT:   Date:  04/18/2022  Therapeutic Exercise:  Aerobic: Supine:thomas stretch x5 45" holds L, piriformis stretch into ER x3 30" holds B then into IR x3 30" holds, bent knee fall outs with long hold for stretch 2 minutes total.  Neuromuscular Re-education: Manual Therapy: Theragun to hip flexor, IT band stretch, Inferior and lateral hip mobs. STM to hip flexor, and abductors.  Therapeutic Activity:  Self Care: Trigger Point Dry Needling:  Modalities: moist heat to L hip flexor.                                                                                                                                 PATIENT EDUCATION:  Education details: on HEP Person educated: Patient Education method: Consulting civil engineer, Media planner, and Handouts Education comprehension: verbalized understanding  HOME EXERCISE PROGRAM: [id number]  ASSESSMENT:  CLINICAL IMPRESSION: Session focused on hip ROM which was tolerated well. She continues to demonstrate limitations in her L hip > R. Tolerated manual today with improvements in pain and muscle tension following. Moist heat applied to L hip at end of session today, pt encouraged to use heat and not ice prior to going to bed. Will continue with current POC as tolerated.   Eval:Patient presents with symptoms consistent with left piriformis syndrome and demonstrates weakness and ROM deficits associated with this. Educated patient in current presentation and POC moving forwarded. Tolerated session well and reported less pain end of session. Patient would greatly benefit from skilled PT to improve overall QOL and function.  OBJECTIVE IMPAIRMENTS: decreased activity tolerance, decreased ROM, decreased strength, postural dysfunction, and pain.   ACTIVITY LIMITATIONS: sitting, standing, squatting, sleeping, transfers, and locomotion level  PARTICIPATION LIMITATIONS: meal prep, cleaning, and occupation  PERSONAL FACTORS: Age and Fitness are also affecting patient's functional outcome.   REHAB POTENTIAL: Good  CLINICAL DECISION MAKING: Stable/uncomplicated  EVALUATION COMPLEXITY: Low   GOALS: Goals reviewed with patient? yes  SHORT TERM GOALS: Target date: 04/30/2022  Patient will be independent in self management strategies to improve quality of life and functional outcomes. Baseline: New Program Goal status: INITIAL  2.  Patient will report at least 50% improvement in overall symptoms and/or function to demonstrate improved functional mobility Baseline: 0% better Goal status: INITIAL  3.  Patient will  demonstrate at least 4/5 MMT in B LEs Baseline:  Goal status: INITIAL      LONG TERM GOALS: Target date: 05/28/2022   Patient will report at least 75% improvement in overall symptoms and/or function to demonstrate improved functional mobility Baseline: 0% better Goal status: INITIAL  2.  Patient will be able to demonstrate painfree hip ROM Baseline: see above Goal status:  INITIAL  3.  Patient will be able to stand and sit without pain to improve sitting/standing tolerance. Baseline:  Goal status: INITIAL      PLAN:  PT FREQUENCY: 2x/week  PT DURATION: 8 weeks  PLANNED INTERVENTIONS: Therapeutic exercises, Therapeutic activity, Neuromuscular re-education, Balance training, Gait training, Patient/Family education, Self Care, Joint mobilization, Joint manipulation, Stair training, Orthotic/Fit training, Aquatic Therapy, Dry Needling, Electrical stimulation, Spinal manipulation, Spinal mobilization, Cryotherapy, Moist heat, Taping, Traction, Ultrasound, Ionotophoresis 4mg /ml Dexamethasone, Manual therapy, and Re-evaluation  PLAN FOR NEXT SESSION: hip ROM, STM, lumbar ROM, MMT  PT, DPT 04/18/22  1:04 PM

## 2022-04-23 ENCOUNTER — Encounter: Payer: Self-pay | Admitting: Family Medicine

## 2022-04-23 ENCOUNTER — Encounter: Payer: Self-pay | Admitting: Physical Therapy

## 2022-04-23 ENCOUNTER — Ambulatory Visit (INDEPENDENT_AMBULATORY_CARE_PROVIDER_SITE_OTHER): Payer: Medicare Other | Admitting: Family Medicine

## 2022-04-23 ENCOUNTER — Ambulatory Visit (INDEPENDENT_AMBULATORY_CARE_PROVIDER_SITE_OTHER): Payer: Medicare Other | Admitting: Physical Therapy

## 2022-04-23 VITALS — BP 130/86 | HR 80 | Temp 97.6°F | Ht 61.0 in | Wt 122.4 lb

## 2022-04-23 DIAGNOSIS — I10 Essential (primary) hypertension: Secondary | ICD-10-CM | POA: Diagnosis not present

## 2022-04-23 DIAGNOSIS — M25552 Pain in left hip: Secondary | ICD-10-CM

## 2022-04-23 DIAGNOSIS — E782 Mixed hyperlipidemia: Secondary | ICD-10-CM

## 2022-04-23 DIAGNOSIS — E079 Disorder of thyroid, unspecified: Secondary | ICD-10-CM

## 2022-04-23 DIAGNOSIS — M6281 Muscle weakness (generalized): Secondary | ICD-10-CM | POA: Diagnosis not present

## 2022-04-23 DIAGNOSIS — E119 Type 2 diabetes mellitus without complications: Secondary | ICD-10-CM

## 2022-04-23 LAB — TSH: TSH: 0.25 u[IU]/mL — ABNORMAL LOW (ref 0.35–5.50)

## 2022-04-23 LAB — LIPID PANEL
Cholesterol: 207 mg/dL — ABNORMAL HIGH (ref 0–200)
HDL: 45.2 mg/dL (ref >39.00)
NonHDL: 161.38
Total CHOL/HDL Ratio: 5
Triglycerides: 262 mg/dL — ABNORMAL HIGH (ref 0.0–149.0)
VLDL: 52.4 mg/dL — ABNORMAL HIGH (ref 0.0–40.0)

## 2022-04-23 LAB — COMPREHENSIVE METABOLIC PANEL
ALT: 16 U/L (ref 0–35)
AST: 17 U/L (ref 0–37)
Albumin: 4.6 g/dL (ref 3.5–5.2)
Alkaline Phosphatase: 53 U/L (ref 39–117)
BUN: 22 mg/dL (ref 6–23)
CO2: 26 mEq/L (ref 19–32)
Calcium: 9.8 mg/dL (ref 8.4–10.5)
Chloride: 103 mEq/L (ref 96–112)
Creatinine, Ser: 0.79 mg/dL (ref 0.40–1.20)
GFR: 76.2 mL/min (ref >60.00)
Glucose, Bld: 147 mg/dL — ABNORMAL HIGH (ref 70–99)
Potassium: 4.2 mEq/L (ref 3.5–5.1)
Sodium: 139 mEq/L (ref 135–145)
Total Bilirubin: 0.6 mg/dL (ref 0.2–1.2)
Total Protein: 7.3 g/dL (ref 6.0–8.3)

## 2022-04-23 LAB — CBC WITH DIFFERENTIAL/PLATELET
Basophils Absolute: 0 10*3/uL (ref 0.0–0.1)
Basophils Relative: 0.5 % (ref 0.0–3.0)
Eosinophils Absolute: 0.1 10*3/uL (ref 0.0–0.7)
Eosinophils Relative: 2.7 % (ref 0.0–5.0)
HCT: 38.4 % (ref 36.0–46.0)
Hemoglobin: 13 g/dL (ref 12.0–15.0)
Lymphocytes Relative: 51.3 % — ABNORMAL HIGH (ref 12.0–46.0)
Lymphs Abs: 2.7 10*3/uL (ref 0.7–4.0)
MCHC: 33.8 g/dL (ref 30.0–36.0)
MCV: 87.6 fl (ref 78.0–100.0)
Monocytes Absolute: 0.4 10*3/uL (ref 0.1–1.0)
Monocytes Relative: 6.7 % (ref 3.0–12.0)
Neutro Abs: 2 10*3/uL (ref 1.4–7.7)
Neutrophils Relative %: 38.8 % — ABNORMAL LOW (ref 43.0–77.0)
Platelets: 324 10*3/uL (ref 150.0–400.0)
RBC: 4.38 Mil/uL (ref 3.87–5.11)
RDW: 12.9 % (ref 11.5–15.5)
WBC: 5.3 10*3/uL (ref 4.0–10.5)

## 2022-04-23 LAB — LDL CHOLESTEROL, DIRECT: Direct LDL: 127 mg/dL

## 2022-04-23 LAB — HEMOGLOBIN A1C: Hgb A1c MFr Bld: 7.2 % — ABNORMAL HIGH (ref 4.6–6.5)

## 2022-04-23 MED ORDER — PRAVASTATIN SODIUM 20 MG PO TABS: 20.0000 mg | ORAL_TABLET | Freq: Every day | ORAL | 1 refills | Status: DC

## 2022-04-23 NOTE — Therapy (Signed)
OUTPATIENT PHYSICAL THERAPY TREATMENT NOTE   Patient Name: Diana Gilmore MRN: [id number] DOB:July 16, 1950, 72 y.o., female Today's Date: 04/23/2022  PCP: Jeani Sow, MD   REFERRING PROVIDER: Jeani Sow, MD   END OF SESSION:   PT End of Session - 04/23/22 1002     Visit Number 6    Number of Visits 16    Date for PT Re-Evaluation 05/28/22    Authorization Type BCBS medicare    Progress Note Due on Visit 10    PT Start Time 1010    PT Stop Time 1050    PT Time Calculation (min) 40 min    Activity Tolerance Patient tolerated treatment well    Behavior During Therapy [facility] for tasks assessed/performed               Past Medical History:  Diagnosis Date   Hyperlipidemia    Hypertension    Thyroid disease    Past Surgical History:  Procedure Laterality Date   THYROID SURGERY     Patient Active Problem List   Diagnosis Date Noted   Primary osteoarthritis involving multiple joints 09/10/2021   Prediabetes 09/10/2021   Metabolic syndrome X 12/07/2016   Thyroid disease 08/31/2016   Hypertension 08/31/2016   Nuclear sclerotic cataract of both eyes 06/18/2016   Hyperlipemia 12/27/2015   Type 2 diabetes mellitus without complication, without long-term current use of insulin (HCC) 12/27/2015    THERAPY DIAG:  Pain in left hip  Muscle weakness (generalized)   REFERRING DIAG:  M25.552 (ICD-10-CM) - Pain of left hip  M54.32 (ICD-10-CM) - Sciatica, left side    THERAPY DIAG:  Pain in left hip  Muscle weakness (generalized)  Rationale for Evaluation and Treatment: Rehabilitation  ONSET DATE: 1 week  SUBJECTIVE:   SUBJECTIVE STATEMENT: 04/23/2022 States that she continues to have numbness in her left groin. States that her pain is not that bad, states that she is using Voltaren and that helps  Eval: States that her pain started about a week ago during the day. States that she still works a few hours a day. States that she stood for 10-12 hours during the  day for holidays. States that she has tingling and numbness in her left hip. States she has difficulty sleeping. States she tried meloxicam and that helped a little but the pain came back. States she went to the urgent care and the prednisone helped and she is still on it.    PERTINENT HISTORY: NA PAIN:  Are you having pain? Yes: NPRS scale: 5/10 Pain location: left anterior hip, buttock  Pain description: burning, numb, sharp Aggravating factors: sitting Relieving factors: standing, medication  PRECAUTIONS: None  WEIGHT BEARING RESTRICTIONS: No  FALLS:  Has patient fallen in last 6 months? No    OCCUPATION: currently working at [employer] - 20 hours a week   PLOF: Independent  PATIENT GOALS:to have less pain   OBJECTIVE:    PATIENT SURVEYS:  FOTO next session  COGNITION: Overall cognitive status: Within functional limits for tasks assessed     SENSATION: Not tested  EDEMA:  none    POSTURE: rounded shoulders, forward head, posterior pelvic tilt, and flexed trunk   PALPATION: Tenderness to palpation along left glutes/piriformis and lumbar paraspinals    Lumbar A/PROM:    04/02/2022      Flexion 25% limited      Extension 25% limited      R ROT       L ROT  R SB 25% limited     L SB 25% limited     No pain just stretching  (Blank rows = not tested)   LE Measurements Lower Extremity Right 04/02/2022 Left 04/02/2022   A/PROM MMT A/PROM MMT  Hip Flexion WFL 4+ WFL 3+  Hip Extension      Hip Abduction      Hip Adduction      Hip Internal rotation 30  15*   Hip External rotation 45  45*   Knee Flexion  4+  3+  Knee Extension  4+  3+  Ankle Dorsiflexion  4+  3+  Ankle Plantarflexion      Ankle Inversion      Ankle Eversion       (Blank rows = not tested) * pain   LOWER EXTREMITY SPECIAL TESTS:  Marcello Moores test + B    TODAY'S TREATMENT:   Date:  04/23/2022  Therapeutic Exercise:  Prone: HIP IR/ER  PROM and active - very difficult improved  with PT stabilizing pelvis for hip extension 5 minutes Supine:bridge with hip abd red band x25 5" holds, hip IR/ER AROM 5 minutes B Seated self mobilization with tigertail to left LE   Neuromuscular Re-education: Manual Therapy: STM to left hip flexor/adductors with and without tigertail - tolerated well, left leg traction with and without PROM of left hip in all directions Therapeutic Activity:  Self Care: Trigger Point Dry Needling:  Modalities: moist heat to L hip during STM.                                                                                                                                PATIENT EDUCATION:  Education details: on HEP Person educated: Patient Education method: Consulting civil engineer, Media planner, and Handouts Education comprehension: verbalized understanding  HOME EXERCISE PROGRAM: [id number]  ASSESSMENT:  CLINICAL IMPRESSION: Continues to have limited left hip ROM into IR and extension. Tolerated session well and reported reduced pain end of session but continued numbness. Improved hip IR noted after isolated hip extension work with PT stabilizing lumbar/pelvis. Will continue with current POC as tolerated.   Eval:Patient presents with symptoms consistent with left piriformis syndrome and demonstrates weakness and ROM deficits associated with this. Educated patient in current presentation and POC moving forwarded. Tolerated session well and reported less pain end of session. Patient would greatly benefit from skilled PT to improve overall QOL and function.  OBJECTIVE IMPAIRMENTS: decreased activity tolerance, decreased ROM, decreased strength, postural dysfunction, and pain.   ACTIVITY LIMITATIONS: sitting, standing, squatting, sleeping, transfers, and locomotion level  PARTICIPATION LIMITATIONS: meal prep, cleaning, and occupation  PERSONAL FACTORS: Age and Fitness are also affecting patient's functional outcome.   REHAB POTENTIAL: Good  CLINICAL DECISION  MAKING: Stable/uncomplicated  EVALUATION COMPLEXITY: Low   GOALS: Goals reviewed with patient? yes  SHORT TERM GOALS: Target date: 04/30/2022  Patient will be independent in self management strategies to improve quality of life and functional  outcomes. Baseline: New Program Goal status: INITIAL  2.  Patient will report at least 50% improvement in overall symptoms and/or function to demonstrate improved functional mobility Baseline: 0% better Goal status: INITIAL  3.  Patient will demonstrate at least 4/5 MMT in B LEs Baseline:  Goal status: INITIAL      LONG TERM GOALS: Target date: 05/28/2022   Patient will report at least 75% improvement in overall symptoms and/or function to demonstrate improved functional mobility Baseline: 0% better Goal status: INITIAL  2.  Patient will be able to demonstrate painfree hip ROM Baseline: see above Goal status: INITIAL  3.  Patient will be able to stand and sit without pain to improve sitting/standing tolerance. Baseline:  Goal status: INITIAL      PLAN:  PT FREQUENCY: 2x/week  PT DURATION: 8 weeks  PLANNED INTERVENTIONS: Therapeutic exercises, Therapeutic activity, Neuromuscular re-education, Balance training, Gait training, Patient/Family education, Self Care, Joint mobilization, Joint manipulation, Stair training, Orthotic/Fit training, Aquatic Therapy, Dry Needling, Electrical stimulation, Spinal manipulation, Spinal mobilization, Cryotherapy, Moist heat, Taping, Traction, Ultrasound, Ionotophoresis 4mg /ml Dexamethasone, Manual therapy, and Re-evaluation  PLAN FOR NEXT SESSION: hip ROM, STM, lumbar ROM, MMT  11:25 AM, 04/23/22 Jerene Pitch, DPT Physical Therapy with [facility]

## 2022-04-23 NOTE — Patient Instructions (Addendum)
It was very nice to see you today!  Send me notes from eye doctor.  Consider shingles shot at pharmacy   PLEASE NOTE:  If you had any lab tests please let us know if you have not heard back within a few days. You may see your results on MyChart before we have a chance to review them but we will give you a call once they are reviewed by [place]. If we ordered any referrals today, please let us know if you have not heard from their office within the next week.   Please try these tips to maintain a healthy lifestyle:  Eat most of your calories during the day when you are active. Eliminate processed foods including packaged sweets (pies, cakes, cookies), reduce intake of potatoes, white bread, white pasta, and white rice. Look for whole grain options, oat flour or almond flour.  Each meal should contain half fruits/vegetables, one quarter protein, and one quarter carbs (no bigger than a computer mouse).  Cut down on sweet beverages. This includes juice, soda, and sweet tea. Also watch fruit intake, though this is a healthier sweet option, it still contains natural sugar! Limit to 3 servings daily.  Drink at least 1 glass of water with each meal and aim for at least 8 glasses per day  Exercise at least 150 minutes every week.

## 2022-04-23 NOTE — Progress Notes (Signed)
Subjective:     Patient ID: Diana Gilmore, female    DOB: 1950/07/26, 72 y.o.   MRN: [id number]  Chief Complaint  Patient presents with   Follow-up    6 month follow-up Fasting     HPI 1  HTN-Pt is on benazipril.  Bp's running 120's/70's.  No ha/dizziness/cp/palp/edema/cough/sob 2.  HLD-on pravastatin 20mg  3.  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  PreDM-working on diet/exercise-ophth 2 mo ago-Dr.     Health Maintenance Due  Topic Date Due   Medicare Annual Wellness (AWV)  Never done   OPHTHALMOLOGY EXAM  06/18/2017   FOOT EXAM  08/27/2018   HEMOGLOBIN A1C  03/12/2022    Past Medical History:  Diagnosis Date   Hyperlipidemia    Hypertension    Thyroid disease     Past Surgical History:  Procedure Laterality Date   THYROID SURGERY      Outpatient Medications Prior to Visit  Medication Sig Dispense Refill   benazepril (LOTENSIN) 10 MG tablet Take 1 tablet (10 mg total) by mouth daily. 90 tablet 3   celecoxib (CELEBREX) 200 MG capsule Take 1 capsule (200 mg total) by mouth 2 (two) times daily. 60 capsule 1   levothyroxine (SYNTHROID) 100 MCG tablet Take 1 tablet (100 mcg total) by mouth every morning. 90 tablet 3   Multiple Vitamins-Minerals (CENTRUM SILVER 50+WOMEN) TABS Take 1 tablet by mouth.     pravastatin (PRAVACHOL) 20 MG tablet Take 20 mg by mouth daily.     No facility-administered medications prior to visit.    Allergies  Allergen Reactions   Prednisone Other (See Comments)    dizziness   ROS neg/noncontributory except as noted HPI/below Pyriformis syndrome-going to PT-some better      Objective:     BP 130/86   Pulse 80   Temp 97.6 F (36.4 C) (Temporal)   Ht 5\' 1"  (1.549 m)   Wt 122 lb 6 oz (55.5 kg)   SpO2 99%   BMI 23.12 kg/m  Wt Readings from Last 3 Encounters:  04/23/22 122 lb 6 oz (55.5 kg)  04/02/22 126 lb 12.8 oz (57.5 kg)  01/29/22 125 lb 8 oz (56.9 kg)    Physical Exam   Gen: WDWN NAD HEENT: NCAT, conjunctiva not  injected, sclera nonicteric NECK:  supple, no thyromegaly, no nodes, no carotid bruits CARDIAC: RRR, S1S2+, no murmur. DP 2+B LUNGS: CTAB. No wheezes ABDOMEN:  BS+, soft, NTND, No HSM, no masses EXT:  no edema MSK: no gross abnormalities.  NEURO: A&O x3.  CN II-XII intact.  PSYCH: normal mood. Good eye contact  Diabetic Foot Exam - Simple   Simple Foot Form Diabetic Foot exam was performed with the following findings: Yes 04/23/2022  9:28 AM  Visual Inspection See comments: Yes Sensation Testing Intact to touch and monofilament testing bilaterally: Yes Pulse Check Posterior Tibialis and Dorsalis pulse intact bilaterally: Yes Comments Bilat bunions.  Dry skin         Assessment & Plan:   Problem List Items Addressed This Visit       Cardiovascular and Mediastinum   Hypertension - Primary   Relevant Orders   Comprehensive metabolic panel   CBC with Differential/Platelet     Endocrine   Type 2 diabetes mellitus without complication, without long-term current use of insulin (HCC)   Relevant Orders   Comprehensive metabolic panel   Hemoglobin A1c   Thyroid disease   Relevant Orders   TSH     Other  Hyperlipemia   Relevant Orders   Comprehensive metabolic panel   Lipid panel   HTN-chronic-controlled.  Cont benazepril 10mg .  Check cbc,cmp DM type 2-chronic. Controlled.  Pt prefers no meds.  Work on diet/exercise.  Foot exam done today.  Get copy eye exam from 2 mo ago.  Check A1C,CMP HLD-mixed.  Chronic.  Not ideal.  Cont pravastatin 20mg .  Check lipids,cmp. Renewed meds. Post surg hypothyroidism-chronic.  Controlled.  Cont synthroid 100. Check TSH F/u 66m  No orders of the defined types were placed in this encounter.   Wellington Hampshire, MD

## 2022-04-25 ENCOUNTER — Encounter: Payer: BLUE CROSS/BLUE SHIELD | Admitting: Physical Therapy

## 2022-04-25 ENCOUNTER — Encounter: Payer: Medicare Other | Admitting: Physical Therapy

## 2022-04-25 NOTE — Progress Notes (Signed)
1.  Sugars are too high-needs to work more on diet-does she want meds as well or work more? 2.  Cholesterol too high-increase pravastatin to 40mg  daily 3.  Thyroid dose too high-does she want to decrease to 0.075mg /day or one day/week, skip the dose of the 100?

## 2022-04-30 ENCOUNTER — Ambulatory Visit: Payer: Medicare Other | Admitting: Physical Therapy

## 2022-04-30 ENCOUNTER — Other Ambulatory Visit: Payer: Self-pay | Admitting: Family Medicine

## 2022-04-30 ENCOUNTER — Telehealth: Payer: Self-pay | Admitting: Family Medicine

## 2022-04-30 ENCOUNTER — Encounter: Payer: Self-pay | Admitting: Physical Therapy

## 2022-04-30 DIAGNOSIS — M25552 Pain in left hip: Secondary | ICD-10-CM | POA: Diagnosis not present

## 2022-04-30 DIAGNOSIS — M6281 Muscle weakness (generalized): Secondary | ICD-10-CM | POA: Diagnosis not present

## 2022-04-30 MED ORDER — LEVOTHYROXINE SODIUM 75 MCG PO TABS: 75.0000 ug | ORAL_TABLET | Freq: Every day | ORAL | 3 refills | Status: DC

## 2022-04-30 NOTE — Therapy (Signed)
OUTPATIENT PHYSICAL THERAPY TREATMENT NOTE   Patient Name: Diana Gilmore MRN: [id number] DOB:11-22-1950, 72 y.o., female Today's Date: 04/30/2022  PCP: Tawnya Crook, MD   REFERRING PROVIDER: Tawnya Crook, MD   END OF SESSION:   PT End of Session - 04/30/22 1346     Visit Number 7    Number of Visits 16    Date for PT Re-Evaluation 05/28/22    Authorization Type BCBS medicare    Progress Note Due on Visit 10    PT Start Time 1347    PT Stop Time 1425    PT Time Calculation (min) 38 min    Activity Tolerance Patient tolerated treatment well    Behavior During Therapy [facility] for tasks assessed/performed               Past Medical History:  Diagnosis Date   Hyperlipidemia    Hypertension    Thyroid disease    Past Surgical History:  Procedure Laterality Date   THYROID SURGERY     Patient Active Problem List   Diagnosis Date Noted   Primary osteoarthritis involving multiple joints 09/10/2021   Prediabetes 74/10/1446   Metabolic syndrome X 18/56/3149   Thyroid disease 08/31/2016   Hypertension 08/31/2016   Nuclear sclerotic cataract of both eyes 06/18/2016   Hyperlipemia 12/27/2015   Type 2 diabetes mellitus without complication, without long-term current use of insulin ([place]) 12/27/2015    THERAPY DIAG:  Pain in left hip  Muscle weakness (generalized)   REFERRING DIAG:  M25.552 (ICD-10-CM) - Pain of left hip  M54.32 (ICD-10-CM) - Sciatica, left side    THERAPY DIAG:  Pain in left hip  Muscle weakness (generalized)  Rationale for Evaluation and Treatment: Rehabilitation  ONSET DATE: 1 week  SUBJECTIVE:   SUBJECTIVE STATEMENT: 04/30/2022 States that she feels a little better and is still having some numbness.   Eval: States that her pain started about a week ago during the day. States that she still works a few hours a day. States that she stood for 10-12 hours during the day for holidays. States that she has tingling and numbness in her left  hip. States she has difficulty sleeping. States she tried meloxicam and that helped a little but the pain came back. States she went to the urgent care and the prednisone helped and she is still on it.    PERTINENT HISTORY: NA PAIN:  Are you having pain? Yes: NPRS scale: 4-5/10 Pain location: left anterior hip, buttock  Pain description: burning, numb, sharp Aggravating factors: sitting Relieving factors: standing, medication  PRECAUTIONS: None  WEIGHT BEARING RESTRICTIONS: No  FALLS:  Has patient fallen in last 6 months? No    OCCUPATION: currently working at [employer] - 20 hours a week   PLOF: Independent  PATIENT GOALS:to have less pain   OBJECTIVE:    PATIENT SURVEYS:  FOTO next session  COGNITION: Overall cognitive status: Within functional limits for tasks assessed     SENSATION: Not tested  EDEMA:  none    POSTURE: rounded shoulders, forward head, posterior pelvic tilt, and flexed trunk   PALPATION: Tenderness to palpation along left glutes/piriformis and lumbar paraspinals    Lumbar A/PROM:    04/02/2022      Flexion 25% limited      Extension 25% limited      R ROT       L ROT       R SB 25% limited     L SB  25% limited     No pain just stretching  (Blank rows = not tested)   LE Measurements Lower Extremity Right 04/02/2022 Left 04/02/2022   A/PROM MMT A/PROM MMT  Hip Flexion WFL 4+ WFL 3+  Hip Extension      Hip Abduction      Hip Adduction      Hip Internal rotation 30  15*   Hip External rotation 45  45*   Knee Flexion  4+  3+  Knee Extension  4+  3+  Ankle Dorsiflexion  4+  3+  Ankle Plantarflexion      Ankle Inversion      Ankle Eversion       (Blank rows = not tested) * pain   LOWER EXTREMITY SPECIAL TESTS:  Marcello Moores test + B    TODAY'S TREATMENT:   Date:  04/30/2022  Therapeutic Exercise:  Prone: TKE x10 5" hlds B, corss body rotations x10  10" hlds B Supine:thomas stretch x3 30" L, bridges x20 5" hlds, ITB stretch  with strap x6 10" holds B S/l rev clamshells x20 B PT assist with ROM    Neuromuscular Re-education: pelvic tilts tactile cues and prior demonstration - 15 minutes  Manual Therapy:  Therapeutic Activity:  Self Care: Trigger Point Dry Needling:  Modalities: moist heat to L hip during STM.                                                                                                                                PATIENT EDUCATION:  Education details: on HEP Person educated: Patient Education method: Explanation, Media planner, and Handouts Education comprehension: verbalized understanding  HOME EXERCISE PROGRAM: [id number]  ASSESSMENT:  CLINICAL IMPRESSION: Continued to focus on hip mobility. Difficulty with pelvic tilts especially anteriorly. Tolerated posterior tilt better. Added this to HEP and cued patient to perform this with  bridges secondary to complaints of low back pain with bridge after performing with fatigue. Tolerated bridge with pelvic tilt better. Added new exercises to HEP. Will continue with current POC as tolerated.   Eval:Patient presents with symptoms consistent with left piriformis syndrome and demonstrates weakness and ROM deficits associated with this. Educated patient in current presentation and POC moving forwarded. Tolerated session well and reported less pain end of session. Patient would greatly benefit from skilled PT to improve overall QOL and function.  OBJECTIVE IMPAIRMENTS: decreased activity tolerance, decreased ROM, decreased strength, postural dysfunction, and pain.   ACTIVITY LIMITATIONS: sitting, standing, squatting, sleeping, transfers, and locomotion level  PARTICIPATION LIMITATIONS: meal prep, cleaning, and occupation  PERSONAL FACTORS: Age and Fitness are also affecting patient's functional outcome.   REHAB POTENTIAL: Good  CLINICAL DECISION MAKING: Stable/uncomplicated  EVALUATION COMPLEXITY: Low   GOALS: Goals reviewed with  patient? yes  SHORT TERM GOALS: Target date: 04/30/2022  Patient will be independent in self management strategies to improve quality of life and functional outcomes. Baseline: New Program Goal status: INITIAL  2.  Patient will report at least 50% improvement in overall symptoms and/or function to demonstrate improved functional mobility Baseline: 0% better Goal status: INITIAL  3.  Patient will demonstrate at least 4/5 MMT in B LEs Baseline:  Goal status: INITIAL      LONG TERM GOALS: Target date: 05/28/2022   Patient will report at least 75% improvement in overall symptoms and/or function to demonstrate improved functional mobility Baseline: 0% better Goal status: INITIAL  2.  Patient will be able to demonstrate painfree hip ROM Baseline: see above Goal status: INITIAL  3.  Patient will be able to stand and sit without pain to improve sitting/standing tolerance. Baseline:  Goal status: INITIAL      PLAN:  PT FREQUENCY: 2x/week  PT DURATION: 8 weeks  PLANNED INTERVENTIONS: Therapeutic exercises, Therapeutic activity, Neuromuscular re-education, Balance training, Gait training, Patient/Family education, Self Care, Joint mobilization, Joint manipulation, Stair training, Orthotic/Fit training, Aquatic Therapy, Dry Needling, Electrical stimulation, Spinal manipulation, Spinal mobilization, Cryotherapy, Moist heat, Taping, Traction, Ultrasound, Ionotophoresis 4mg /ml Dexamethasone, Manual therapy, and Re-evaluation  PLAN FOR NEXT SESSION: hip ROM, STM, lumbar ROM, MMT  2:26 PM, 04/30/22 Jerene Pitch, DPT Physical Therapy with [facility] Sinner

## 2022-04-30 NOTE — Telephone Encounter (Signed)
Patient states she saw MyChart message in regards to lab results. States she is on board with decreasing levothyroxine dosage.   Requests RX with dosage decrease for Levothyroxine be sent to:   [id number] [address], [place] Phone: [phone]  Fax: [phone]

## 2022-05-01 ENCOUNTER — Other Ambulatory Visit: Payer: Self-pay

## 2022-05-02 ENCOUNTER — Ambulatory Visit: Payer: Medicare Other | Admitting: Physical Therapy

## 2022-05-02 ENCOUNTER — Encounter: Payer: Self-pay | Admitting: Physical Therapy

## 2022-05-02 DIAGNOSIS — M25552 Pain in left hip: Secondary | ICD-10-CM

## 2022-05-02 NOTE — Therapy (Signed)
OUTPATIENT PHYSICAL THERAPY TREATMENT NOTE Progress Note Reporting Period 04/02/22 to 05/02/22  See note below for Objective Data and Assessment of Progress/Goals.      Patient Name: Diana Gilmore MRN: [id number] DOB:1951-01-27, 72 y.o., female Today's Date: 05/02/2022  PCP: Tawnya Crook, MD   REFERRING PROVIDER: Tawnya Crook, MD   END OF SESSION:   PT End of Session - 05/02/22 1305     Visit Number 8    Number of Visits 16    Date for PT Re-Evaluation 05/28/22    Authorization Type BCBS medicare    Progress Note Due on Visit 10    PT Start Time 1306    PT Stop Time 1346    PT Time Calculation (min) 40 min    Activity Tolerance Patient tolerated treatment well    Behavior During Therapy [facility] for tasks assessed/performed               Past Medical History:  Diagnosis Date   Hyperlipidemia    Hypertension    Thyroid disease    Past Surgical History:  Procedure Laterality Date   THYROID SURGERY     Patient Active Problem List   Diagnosis Date Noted   Primary osteoarthritis involving multiple joints 09/10/2021   Prediabetes 27/08/2374   Metabolic syndrome X 28/31/5176   Thyroid disease 08/31/2016   Hypertension 08/31/2016   Nuclear sclerotic cataract of both eyes 06/18/2016   Hyperlipemia 12/27/2015   Type 2 diabetes mellitus without complication, without long-term current use of insulin ([place]) 12/27/2015    THERAPY DIAG:  Pain in left hip   REFERRING DIAG:  M25.552 (ICD-10-CM) - Pain of left hip  M54.32 (ICD-10-CM) - Sciatica, left side    THERAPY DIAG:  Pain in left hip  Muscle weakness (generalized)  Rationale for Evaluation and Treatment: Rehabilitation  ONSET DATE: 1 week  SUBJECTIVE:   SUBJECTIVE STATEMENT: 05/02/2022 States that she feels a little better and is still having some numbness. States she feels 50-55% better. States she is not using any medication. States night is still bothering her.  Eval: States that her pain started  about a week ago during the day. States that she still works a few hours a day. States that she stood for 10-12 hours during the day for holidays. States that she has tingling and numbness in her left hip. States she has difficulty sleeping. States she tried meloxicam and that helped a little but the pain came back. States she went to the urgent care and the prednisone helped and she is still on it.    PERTINENT HISTORY: NA PAIN:  Are you having pain? Yes: NPRS scale: 4-5/10 Pain location: left anterior hip, buttock  Pain description: burning, numb, sharp Aggravating factors: sitting Relieving factors: standing, medication  PRECAUTIONS: None  WEIGHT BEARING RESTRICTIONS: No  FALLS:  Has patient fallen in last 6 months? No    OCCUPATION: currently working at [employer] - 20 hours a week   PLOF: Independent  PATIENT GOALS:to have less pain   OBJECTIVE:    PALPATION: Tenderness to palpation along left glutes/piriformis and lumbar paraspinals    Lumbar A/PROM:    04/02/2022   05/02/22   Flexion 25% limited  25% limited     Extension 25% limited  25% limited     R ROT       L ROT       R SB 25% limited   25% limited   L SB 25% limited 25% limited  No pain just stretching  (Blank rows = not tested)   LE Measurements Lower Extremity Right 05/02/2022 Left 05/02/2022   A/PROM MMT A/PROM MMT  Hip Flexion WFL 4+ WFL 3+  Hip Extension      Hip Abduction      Hip Adduction      Hip Internal rotation 30  15   Hip External rotation 60  45   Knee Flexion  4+  4  Knee Extension  4+  4+  Ankle Dorsiflexion  4+  4  Ankle Plantarflexion      Ankle Inversion      Ankle Eversion       (Blank rows = not tested) * pain   LOWER EXTREMITY SPECIAL TESTS:  Marcello Moores test + B    TODAY'S TREATMENT:   Date:  05/02/2022  Therapeutic Exercise:  Quad: childs pose rock back with hips in IR and ER - difficult to perform   Neuromuscular Re-education: pelvic tilts tactile cues and prior  demonstration - 15 minutes - very difficult to perform with cues Manual Therapy: STM to left hip musculature - tolerated moderately well  Therapeutic Activity:  Self Care: Trigger Point Dry Needling:  Modalities: moist heat to L hip during STM.                                                                                                                                PATIENT EDUCATION:  Education details: on HEP, on progress and plan moving forwards, on benefits of dry needling and risks associated (patient afraid of needles) Person educated: Patient Education method: Explanation, Demonstration, and Handouts Education comprehension: verbalized understanding  HOME EXERCISE PROGRAM: [id number]  ASSESSMENT:  CLINICAL IMPRESSION: Patient progressing in regards to strength and pain but continues to have limitations in ROM and numbness around left hip into groin. Discussed following up with sports medicine MD and patient agreeable, with contact PCP about current presentation and progress. Discussed possible benefits of dry needling but patient fearful of needles and would like to avoid any invasive interventions at this time.  Eval:Patient presents with symptoms consistent with left piriformis syndrome and demonstrates weakness and ROM deficits associated with this. Educated patient in current presentation and POC moving forwarded. Tolerated session well and reported less pain end of session. Patient would greatly benefit from skilled PT to improve overall QOL and function.  OBJECTIVE IMPAIRMENTS: decreased activity tolerance, decreased ROM, decreased strength, postural dysfunction, and pain.   ACTIVITY LIMITATIONS: sitting, standing, squatting, sleeping, transfers, and locomotion level  PARTICIPATION LIMITATIONS: meal prep, cleaning, and occupation  PERSONAL FACTORS: Age and Fitness are also affecting patient's functional outcome.   REHAB POTENTIAL: Good  CLINICAL DECISION MAKING:  Stable/uncomplicated  EVALUATION COMPLEXITY: Low   GOALS: Goals reviewed with patient? yes  SHORT TERM GOALS: Target date: 04/30/2022  Patient will be independent in self management strategies to improve quality of life and functional outcomes. Baseline: New Program Goal status: MET  2.  Patient will report at least 50% improvement in overall symptoms and/or function to demonstrate improved functional mobility Baseline: 0% better Goal status: MET  3.  Patient will demonstrate at least 4/5 MMT in B LEs Baseline:  Goal status: PROGRESSING      LONG TERM GOALS: Target date: 05/28/2022   Patient will report at least 75% improvement in overall symptoms and/or function to demonstrate improved functional mobility Baseline: 0% better Goal status: PROGRESSING  2.  Patient will be able to demonstrate painfree hip ROM Baseline: see above Goal status: MET  3.  Patient will be able to stand and sit without pain to improve sitting/standing tolerance. Baseline:  Goal status: MET      PLAN:  PT FREQUENCY: 2x/week  PT DURATION: 8 weeks  PLANNED INTERVENTIONS: Therapeutic exercises, Therapeutic activity, Neuromuscular re-education, Balance training, Gait training, Patient/Family education, Self Care, Joint mobilization, Joint manipulation, Stair training, Orthotic/Fit training, Aquatic Therapy, Dry Needling, Electrical stimulation, Spinal manipulation, Spinal mobilization, Cryotherapy, Moist heat, Taping, Traction, Ultrasound, Ionotophoresis 4mg /ml Dexamethasone, Manual therapy, and Re-evaluation  PLAN FOR NEXT SESSION:  possible f/u with sports MD if PCP agreeable, hip ROM, STM, lumbar ROM, MMT, dry needling if pt agreeable   2:01 PM, 05/02/22 Jerene Pitch, DPT Physical Therapy with [facility]ston Sinner

## 2022-05-07 ENCOUNTER — Encounter: Payer: Self-pay | Admitting: Physical Therapy

## 2022-05-07 ENCOUNTER — Ambulatory Visit: Payer: Medicare Other | Admitting: Physical Therapy

## 2022-05-07 DIAGNOSIS — M6281 Muscle weakness (generalized): Secondary | ICD-10-CM

## 2022-05-07 DIAGNOSIS — M25552 Pain in left hip: Secondary | ICD-10-CM | POA: Diagnosis not present

## 2022-05-07 NOTE — Therapy (Signed)
OUTPATIENT PHYSICAL THERAPY TREATMENT NOTE      Patient Name: Diana Gilmore MRN: [id number] DOB:Jun 17, 1950, 72 y.o., female Today's Date: 05/07/2022  PCP: Tawnya Crook, MD   REFERRING PROVIDER: Tawnya Crook, MD   END OF SESSION:   PT End of Session - 05/07/22 1104     Visit Number 9    Number of Visits 16    Date for PT Re-Evaluation 05/28/22    Authorization Type BCBS medicare    Progress Note Due on Visit 19    PT Start Time 1106    PT Stop Time 1144    PT Time Calculation (min) 38 min    Activity Tolerance Patient tolerated treatment well    Behavior During Therapy [facility] for tasks assessed/performed               Past Medical History:  Diagnosis Date   Hyperlipidemia    Hypertension    Thyroid disease    Past Surgical History:  Procedure Laterality Date   THYROID SURGERY     Patient Active Problem List   Diagnosis Date Noted   Primary osteoarthritis involving multiple joints 09/10/2021   Prediabetes 40/81/4481   Metabolic syndrome X 85/63/1497   Thyroid disease 08/31/2016   Hypertension 08/31/2016   Nuclear sclerotic cataract of both eyes 06/18/2016   Hyperlipemia 12/27/2015   Type 2 diabetes mellitus without complication, without long-term current use of insulin ([place]) 12/27/2015    THERAPY DIAG:  Pain in left hip  Muscle weakness (generalized)   REFERRING DIAG:  M25.552 (ICD-10-CM) - Pain of left hip  M54.32 (ICD-10-CM) - Sciatica, left side    THERAPY DIAG:  Pain in left hip  Muscle weakness (generalized)  Rationale for Evaluation and Treatment: Rehabilitation  ONSET DATE: 1 week  SUBJECTIVE:   SUBJECTIVE STATEMENT: 05/07/2022 States her back is bothering her but her leg feels better. States her numbness is a little bit better. States she forgot to use the rolling stick.  Eval: States that her pain started about a week ago during the day. States that she still works a few hours a day. States that she stood for 10-12 hours during  the day for holidays. States that she has tingling and numbness in her left hip. States she has difficulty sleeping. States she tried meloxicam and that helped a little but the pain came back. States she went to the urgent care and the prednisone helped and she is still on it.    PERTINENT HISTORY: NA PAIN:  Are you having pain? Yes: NPRS scale: 3-4/10 Pain location: left anterior hip, back Pain description: burning, numb, sharp Aggravating factors: sitting Relieving factors: standing, medication  PRECAUTIONS: None  WEIGHT BEARING RESTRICTIONS: No  FALLS:  Has patient fallen in last 6 months? No    OCCUPATION: currently working at [employer] - 20 hours a week   PLOF: Independent  PATIENT GOALS:to have less pain   OBJECTIVE:    PALPATION: Tenderness to palpation along left glutes/piriformis and lumbar paraspinals    Lumbar A/PROM:    04/02/2022   05/02/22   Flexion 25% limited  25% limited     Extension 25% limited  25% limited     R ROT       L ROT       R SB 25% limited   25% limited   L SB 25% limited 25% limited     No pain just stretching  (Blank rows = not tested)   LE Measurements Lower  Extremity Right 05/02/2022 Left 05/02/2022   A/PROM MMT A/PROM MMT  Hip Flexion WFL 4+ WFL 3+  Hip Extension      Hip Abduction      Hip Adduction      Hip Internal rotation 30  15   Hip External rotation 60  45   Knee Flexion  4+  4  Knee Extension  4+  4+  Ankle Dorsiflexion  4+  4  Ankle Plantarflexion      Ankle Inversion      Ankle Eversion       (Blank rows = not tested) * pain   LOWER EXTREMITY SPECIAL TESTS:  Marcello Moores test + B    TODAY'S TREATMENT:   Date:  05/07/2022  Therapeutic Exercise:  Supine:piriformis stretch x3 30" holds B, SKC x10 10" holds B, thomas stretch x3 30" holds B, bent knee fall outs 2 minutes, bridges 2x12 5" holds, hip add iso 3 minutes total with 5" holds , LTR 2 minutes S/l: rev clamshells 3x8 B  Seated: self mobilization with  tiger tail to left lower leg 4 minutes  Neuromuscular Re-education:  Manual Therapy:   Therapeutic Activity:  Self Care: Trigger Point Dry Needling:  Modalities: moist heat to low back during supine interventions                                                                                                                               PATIENT EDUCATION:  Education details: on HEP, on progress and plan moving forwards, on benefits of dry needling and risks associated (patient afraid of needles) Person educated: Patient Education method: Explanation, Demonstration, and Handouts Education comprehension: verbalized understanding  HOME EXERCISE PROGRAM: [id number]  ASSESSMENT:  CLINICAL IMPRESSION: Session focused on review of exercises and adding additional interventions to focus on. Continues to have limitations in left hip motion and required PT assist with reverse clamshells. Overall fatigue noted end of session but no increase in pain.  Eval:Patient presents with symptoms consistent with left piriformis syndrome and demonstrates weakness and ROM deficits associated with this. Educated patient in current presentation and POC moving forwarded. Tolerated session well and reported less pain end of session. Patient would greatly benefit from skilled PT to improve overall QOL and function.  OBJECTIVE IMPAIRMENTS: decreased activity tolerance, decreased ROM, decreased strength, postural dysfunction, and pain.   ACTIVITY LIMITATIONS: sitting, standing, squatting, sleeping, transfers, and locomotion level  PARTICIPATION LIMITATIONS: meal prep, cleaning, and occupation  PERSONAL FACTORS: Age and Fitness are also affecting patient's functional outcome.   REHAB POTENTIAL: Good  CLINICAL DECISION MAKING: Stable/uncomplicated  EVALUATION COMPLEXITY: Low   GOALS: Goals reviewed with patient? yes  SHORT TERM GOALS: Target date: 04/30/2022  Patient will be independent in self management  strategies to improve quality of life and functional outcomes. Baseline: New Program Goal status: MET  2.  Patient will report at least 50% improvement in overall symptoms and/or function to demonstrate improved functional mobility  Baseline: 0% better Goal status: MET  3.  Patient will demonstrate at least 4/5 MMT in B LEs Baseline:  Goal status: PROGRESSING      LONG TERM GOALS: Target date: 05/28/2022   Patient will report at least 75% improvement in overall symptoms and/or function to demonstrate improved functional mobility Baseline: 0% better Goal status: PROGRESSING  2.  Patient will be able to demonstrate painfree hip ROM Baseline: see above Goal status: MET  3.  Patient will be able to stand and sit without pain to improve sitting/standing tolerance. Baseline:  Goal status: MET      PLAN:  PT FREQUENCY: 2x/week  PT DURATION: 8 weeks  PLANNED INTERVENTIONS: Therapeutic exercises, Therapeutic activity, Neuromuscular re-education, Balance training, Gait training, Patient/Family education, Self Care, Joint mobilization, Joint manipulation, Stair training, Orthotic/Fit training, Aquatic Therapy, Dry Needling, Electrical stimulation, Spinal manipulation, Spinal mobilization, Cryotherapy, Moist heat, Taping, Traction, Ultrasound, Ionotophoresis 4mg /ml Dexamethasone, Manual therapy, and Re-evaluation  PLAN FOR NEXT SESSION:  possible f/u with sports MD if PCP agreeable, hip ROM, STM, lumbar ROM, MMT, dry needling if pt agreeable   11:58 AM, 05/07/22 Jerene Pitch, DPT Physical Therapy with [facility]ston Sinner

## 2022-05-09 ENCOUNTER — Encounter: Payer: Self-pay | Admitting: Physical Therapy

## 2022-05-09 ENCOUNTER — Ambulatory Visit: Payer: Medicare Other | Admitting: Physical Therapy

## 2022-05-09 DIAGNOSIS — M6281 Muscle weakness (generalized): Secondary | ICD-10-CM

## 2022-05-09 DIAGNOSIS — M25552 Pain in left hip: Secondary | ICD-10-CM | POA: Diagnosis not present

## 2022-05-09 NOTE — Therapy (Signed)
OUTPATIENT PHYSICAL THERAPY TREATMENT NOTE      Patient Name: Diana Gilmore MRN: [id number] DOB:27-Mar-1951, 72 y.o., female Today's Date: 05/09/2022  PCP: Tawnya Crook, MD   REFERRING PROVIDER: Tawnya Crook, MD   END OF SESSION:   PT End of Session - 05/09/22 1110     Visit Number 10    Number of Visits 16    Date for PT Re-Evaluation 05/28/22    Authorization Type BCBS medicare    Progress Note Due on Visit 19    PT Start Time 1110    PT Stop Time 1145   had to leave for apt   PT Time Calculation (min) 35 min    Activity Tolerance Patient tolerated treatment well    Behavior During Therapy [facility] for tasks assessed/performed               Past Medical History:  Diagnosis Date   Hyperlipidemia    Hypertension    Thyroid disease    Past Surgical History:  Procedure Laterality Date   THYROID SURGERY     Patient Active Problem List   Diagnosis Date Noted   Primary osteoarthritis involving multiple joints 09/10/2021   Prediabetes 40/34/7425   Metabolic syndrome X 95/63/8756   Thyroid disease 08/31/2016   Hypertension 08/31/2016   Nuclear sclerotic cataract of both eyes 06/18/2016   Hyperlipemia 12/27/2015   Type 2 diabetes mellitus without complication, without long-term current use of insulin ([place]) 12/27/2015    THERAPY DIAG:  Pain in left hip  Muscle weakness (generalized)   REFERRING DIAG:  M25.552 (ICD-10-CM) - Pain of left hip  M54.32 (ICD-10-CM) - Sciatica, left side    THERAPY DIAG:  Pain in left hip  Muscle weakness (generalized)  Rationale for Evaluation and Treatment: Rehabilitation  ONSET DATE: 1 week  SUBJECTIVE:   SUBJECTIVE STATEMENT: 05/09/2022 States her back is bothering her but her leg feels better. States her numbness is a little bit better. States she forgot to use the rolling stick.  Eval: States that her pain started about a week ago during the day. States that she still works a few hours a day. States that she stood  for 10-12 hours during the day for holidays. States that she has tingling and numbness in her left hip. States she has difficulty sleeping. States she tried meloxicam and that helped a little but the pain came back. States she went to the urgent care and the prednisone helped and she is still on it.    PERTINENT HISTORY: NA PAIN:  Are you having pain? Yes: NPRS scale: 3-4/10 Pain location: left anterior hip, back Pain description: burning, numb, sharp Aggravating factors: sitting Relieving factors: standing, medication  PRECAUTIONS: None  WEIGHT BEARING RESTRICTIONS: No  FALLS:  Has patient fallen in last 6 months? No    OCCUPATION: currently working at [employer] - 20 hours a week   PLOF: Independent  PATIENT GOALS:to have less pain   OBJECTIVE:    PALPATION: Tenderness to palpation along left glutes/piriformis and lumbar paraspinals    Lumbar A/PROM:    04/02/2022   05/02/22   Flexion 25% limited  25% limited     Extension 25% limited  25% limited     R ROT       L ROT       R SB 25% limited   25% limited   L SB 25% limited 25% limited     No pain just stretching  (Blank rows = not  tested)   LE Measurements Lower Extremity Right 05/02/2022 Left 05/02/2022   A/PROM MMT A/PROM MMT  Hip Flexion WFL 4+ WFL 3+  Hip Extension      Hip Abduction      Hip Adduction      Hip Internal rotation 30  15   Hip External rotation 60  45   Knee Flexion  4+  4  Knee Extension  4+  4+  Ankle Dorsiflexion  4+  4  Ankle Plantarflexion      Ankle Inversion      Ankle Eversion       (Blank rows = not tested) * pain   LOWER EXTREMITY SPECIAL TESTS:  Marcello Moores test + B    TODAY'S TREATMENT:   Date:  05/09/2022   Therapeutic Exercise:  Supine:90/90 supportive position with ball while breathing deeply/rocking side to side 4 minutes, DKC on ball 2 minutes, LTR 2 minutes on ball, hamstring iso ball 2 minutes, SAQs on ball 2 minutes S/l:  Neuromuscular Re-education:  Manual  Therapy:   Therapeutic Activity:  Self Care: Trigger Point Dry Needling:  Modalities: moist heat to low back during supine interventions                                                                                                                               PATIENT EDUCATION:  Education details: on HEP, on using head and motion exercises to help with arthritic pain - on following up with MD about other options for arthritic pain, on general information about adding tumeric root/powder into seasoning of meals as it has been shown to be anti-inflammatory. Person educated: Patient Education method: Explanation, Demonstration, and Handouts Education comprehension: verbalized understanding  HOME EXERCISE PROGRAM: [email or web address]  ASSESSMENT:  CLINICAL IMPRESSION: Session focused on answering questions and addressing low back pain. Tolerated this well and reported reduced pain end of session. Patient had to leave for another apt so session was limited. Will continue with current POC as tolerated.   Eval:Patient presents with symptoms consistent with left piriformis syndrome and demonstrates weakness and ROM deficits associated with this. Educated patient in current presentation and POC moving forwarded. Tolerated session well and reported less pain end of session. Patient would greatly benefit from skilled PT to improve overall QOL and function.  OBJECTIVE IMPAIRMENTS: decreased activity tolerance, decreased ROM, decreased strength, postural dysfunction, and pain.   ACTIVITY LIMITATIONS: sitting, standing, squatting, sleeping, transfers, and locomotion level  PARTICIPATION LIMITATIONS: meal prep, cleaning, and occupation  PERSONAL FACTORS: Age and Fitness are also affecting patient's functional outcome.   REHAB POTENTIAL: Good  CLINICAL DECISION MAKING: Stable/uncomplicated  EVALUATION COMPLEXITY: Low   GOALS: Goals reviewed with patient? yes  SHORT TERM GOALS: Target date: 04/30/2022   Patient will be independent in self management strategies to improve quality of life and functional outcomes. Baseline: New Program Goal status: MET  2.  Patient will report at least 50% improvement in overall symptoms  and/or function to demonstrate improved functional mobility Baseline: 0% better Goal status: MET  3.  Patient will demonstrate at least 4/5 MMT in B LEs Baseline:  Goal status: PROGRESSING      LONG TERM GOALS: Target date: 05/28/2022   Patient will report at least 75% improvement in overall symptoms and/or function to demonstrate improved functional mobility Baseline: 0% better Goal status: PROGRESSING  2.  Patient will be able to demonstrate painfree hip ROM Baseline: see above Goal status: MET  3.  Patient will be able to stand and sit without pain to improve sitting/standing tolerance. Baseline:  Goal status: MET      PLAN:  PT FREQUENCY: 2x/week  PT DURATION: 8 weeks  PLANNED INTERVENTIONS: Therapeutic exercises, Therapeutic activity, Neuromuscular re-education, Balance training, Gait training, Patient/Family education, Self Care, Joint mobilization, Joint manipulation, Stair training, Orthotic/Fit training, Aquatic Therapy, Dry Needling, Electrical stimulation, Spinal manipulation, Spinal mobilization, Cryotherapy, Moist heat, Taping, Traction, Ultrasound, Ionotophoresis 4mg /ml Dexamethasone, Manual therapy, and Re-evaluation  PLAN FOR NEXT SESSION:  possible f/u with sports MD if PCP agreeable, hip ROM, STM, lumbar ROM, MMT, dry needling if pt agreeable   1:05 PM, 05/09/22 Jerene Pitch, DPT Physical Therapy with Royston Sinner

## 2022-05-15 ENCOUNTER — Encounter: Payer: Medicare Other | Admitting: Physical Therapy

## 2022-05-21 ENCOUNTER — Encounter: Payer: Self-pay | Admitting: Physical Therapy

## 2022-05-21 ENCOUNTER — Ambulatory Visit: Payer: Medicare Other | Admitting: Physical Therapy

## 2022-05-21 DIAGNOSIS — M6281 Muscle weakness (generalized): Secondary | ICD-10-CM | POA: Diagnosis not present

## 2022-05-21 DIAGNOSIS — M25552 Pain in left hip: Secondary | ICD-10-CM | POA: Diagnosis not present

## 2022-05-21 NOTE — Therapy (Addendum)
OUTPATIENT PHYSICAL THERAPY TREATMENT NOTE   PHYSICAL THERAPY DISCHARGE SUMMARY  Visits from Start of Care: 11  Current functional level related to goals / functional outcomes: Partially met   Remaining deficits: Continued numbness and back pain   Education / Equipment: On f/u with MD about continued pain/numbness   Patient agrees to discharge. Patient goals were partially met. Patient is being discharged due to not returning since the last visit.  1:40 PM, 05/30/22 Jerene Pitch, DPT Physical Therapy with [place]    Patient Name: Diana Gilmore MRN: [id number] DOB:Apr 14, 1950, 72 y.o., female Today's Date: 05/21/2022  PCP: Tawnya Crook, MD   REFERRING PROVIDER: Tawnya Crook, MD   END OF SESSION:   PT End of Session - 05/21/22 1105     Visit Number 11    Number of Visits 16    Date for PT Re-Evaluation 05/28/22    Authorization Type [employer]BS medicare    Progress Note Due on Visit 19    PT Start Time 1106    PT Stop Time 1144    PT Time Calculation (min) 38 min    Activity Tolerance Patient tolerated treatment well    Behavior During Therapy [facility] for tasks assessed/performed               Past Medical History:  Diagnosis Date   Hyperlipidemia    Hypertension    Thyroid disease    Past Surgical History:  Procedure Laterality Date   THYROID SURGERY     Patient Active Problem List   Diagnosis Date Noted   Primary osteoarthritis involving multiple joints 09/10/2021   Prediabetes [id number]   Metabolic syndrome X [id number]   Thyroid disease 08/31/2016   Hypertension 08/31/2016   Nuclear sclerotic cataract of both eyes 06/18/2016   Hyperlipemia 12/27/2015   Type 2 diabetes mellitus without complication, without long-term current use of insulin ([place]) 12/27/2015    THERAPY DIAG:  Pain in left hip  Muscle weakness (generalized)   REFERRING DIAG:  M25.552 (ICD-10-CM) - Pain of left hip  M54.32 (ICD-10-CM) - Sciatica, left side    THERAPY  DIAG:  Pain in left hip  Muscle weakness (generalized)  Rationale for Evaluation and Treatment: Rehabilitation  ONSET DATE: 1 week  SUBJECTIVE:   SUBJECTIVE STATEMENT: 05/21/2022 States her numbness is better but her back is hurting which is worse with the cold weather. States medication makes her dizzy.  Eval: States that her pain started about a week ago during the day. States that she still works a few hours a day. States that she stood for 10-12 hours during the day for holidays. States that she has tingling and numbness in her left hip. States she has difficulty sleeping. States she tried meloxicam and that helped a little but the pain came back. States she went to the urgent care and the prednisone helped and she is still on it.    PERTINENT HISTORY: NA PAIN:  Are you having pain? Yes: NPRS scale: 3-4/10 in hip and 7/10 in back Pain location: left anterior hip, back Pain description: burning, numb, sharp Aggravating factors: sitting Relieving factors: standing, medication  PRECAUTIONS: None  WEIGHT BEARING RESTRICTIONS: No  FALLS:  Has patient fallen in last 6 months? No    OCCUPATION: currently working at [employer] - 20 hours a week   PLOF: Independent  PATIENT GOALS:to have less pain   OBJECTIVE:    PALPATION: Tenderness to palpation along left glutes/piriformis and lumbar paraspinals    Lumbar A/PROM:  04/02/2022   05/02/22   Flexion 25% limited  25% limited     Extension 25% limited  25% limited     R ROT       L ROT       R SB 25% limited   25% limited   L SB 25% limited 25% limited     No pain just stretching  (Blank rows = not tested)   LE Measurements Lower Extremity Right 05/02/2022 Left 05/02/2022   A/PROM MMT A/PROM MMT  Hip Flexion WFL 4+ WFL 3+  Hip Extension      Hip Abduction      Hip Adduction      Hip Internal rotation 30  15   Hip External rotation 60  45   Knee Flexion  4+  4  Knee Extension  4+  4+  Ankle Dorsiflexion  4+   4  Ankle Plantarflexion      Ankle Inversion      Ankle Eversion       (Blank rows = not tested) * pain   LOWER EXTREMITY SPECIAL TESTS:  Marcello Moores test + B    TODAY'S TREATMENT:   Date:  05/21/2022 Therapeutic Exercise:  Supine:90/90 supportive position with ball while breathing deeply/rocking side to side 4 minutes, self traction with belt 10 minutes with legs supported and towels for comfort Prone:  cat cow - difficult with prior demo/video and tactile cues stopped after 5 minutes, child's pose - tolerated better 6 minutes Standing lumbar ext at wall x20 5" holds, SB at wall x20 5" holds with OH reach Neuromuscular Re-education:  Manual Therapy:   Therapeutic Activity:  Self Care: Trigger Point Dry Needling:  Modalities: moist heat to low back during supine interventions                                                                                                                               PATIENT EDUCATION:  Education details: on HEP, on  difference between DN and Acupuncture per patient inquiry, on different types of mattress Person educated: Patient Education method: Explanation, Demonstration, and Handouts Education comprehension: verbalized understanding  HOME EXERCISE PROGRAM: [id number]  ASSESSMENT:  CLINICAL IMPRESSION: Patient with increased low back pain today. Discussed difference between acupuncture and DN as patient curious about the intervention and how it would potentially help her. Difficult for patient to perform cat cow even with prior demonstration and video. Child's pose tolerated better as well as wall exercises. Printed these off for patient. Overall left hip pain better but back pain still most aggravating issue at this time. Will continue with current POC as tolerated.   Eval:Patient presents with symptoms consistent with left piriformis syndrome and demonstrates weakness and ROM deficits associated with this. Educated patient in current presentation  and POC moving forwarded. Tolerated session well and reported less pain end of session. Patient would greatly benefit from skilled PT to improve overall QOL and function.  OBJECTIVE IMPAIRMENTS: decreased activity tolerance, decreased ROM, decreased strength, postural dysfunction, and pain.   ACTIVITY LIMITATIONS: sitting, standing, squatting, sleeping, transfers, and locomotion level  PARTICIPATION LIMITATIONS: meal prep, cleaning, and occupation  PERSONAL FACTORS: Age and Fitness are also affecting patient's functional outcome.   REHAB POTENTIAL: Good  CLINICAL DECISION MAKING: Stable/uncomplicated  EVALUATION COMPLEXITY: Low   GOALS: Goals reviewed with patient? yes  SHORT TERM GOALS: Target date: 04/30/2022  Patient will be independent in self management strategies to improve quality of life and functional outcomes. Baseline: New Program Goal status: MET  2.  Patient will report at least 50% improvement in overall symptoms and/or function to demonstrate improved functional mobility Baseline: 0% better Goal status: MET  3.  Patient will demonstrate at least 4/5 MMT in B LEs Baseline:  Goal status: PROGRESSING      LONG TERM GOALS: Target date: 05/28/2022   Patient will report at least 75% improvement in overall symptoms and/or function to demonstrate improved functional mobility Baseline: 0% better Goal status: PROGRESSING  2.  Patient will be able to demonstrate painfree hip ROM Baseline: see above Goal status: MET  3.  Patient will be able to stand and sit without pain to improve sitting/standing tolerance. Baseline:  Goal status: MET      PLAN:  PT FREQUENCY: 2x/week  PT DURATION: 8 weeks  PLANNED INTERVENTIONS: Therapeutic exercises, Therapeutic activity, Neuromuscular re-education, Balance training, Gait training, Patient/Family education, Self Care, Joint mobilization, Joint manipulation, Stair training, Orthotic/Fit training, Aquatic Therapy, Dry  Needling, Electrical stimulation, Spinal manipulation, Spinal mobilization, Cryotherapy, Moist heat, Taping, Traction, Ultrasound, Ionotophoresis '4mg'$ /ml Dexamethasone, Manual therapy, and Re-evaluation  PLAN FOR NEXT SESSION:  possible f/u with sports MD if PCP agreeable, hip ROM, STM, lumbar ROM, MMT, dry needling if pt agreeable   11:50 AM, 05/21/22 Jerene Pitch, DPT Physical Therapy with [facility]

## 2022-05-28 ENCOUNTER — Encounter: Payer: Medicare Other | Admitting: Physical Therapy

## 2022-06-12 ENCOUNTER — Other Ambulatory Visit: Payer: Self-pay | Admitting: Family Medicine

## 2022-06-26 DIAGNOSIS — K080 Exfoliation of teeth due to systemic causes: Secondary | ICD-10-CM | POA: Diagnosis not present

## 2022-07-03 DIAGNOSIS — K080 Exfoliation of teeth due to systemic causes: Secondary | ICD-10-CM | POA: Diagnosis not present

## 2022-07-09 ENCOUNTER — Ambulatory Visit (INDEPENDENT_AMBULATORY_CARE_PROVIDER_SITE_OTHER): Payer: Medicare Other | Admitting: Family Medicine

## 2022-07-09 ENCOUNTER — Encounter: Payer: Self-pay | Admitting: Family Medicine

## 2022-07-09 VITALS — BP 124/72 | HR 103 | Temp 97.5°F | Ht 61.0 in | Wt 121.0 lb

## 2022-07-09 DIAGNOSIS — R509 Fever, unspecified: Secondary | ICD-10-CM | POA: Diagnosis not present

## 2022-07-09 DIAGNOSIS — U071 COVID-19: Secondary | ICD-10-CM | POA: Diagnosis not present

## 2022-07-09 DIAGNOSIS — J029 Acute pharyngitis, unspecified: Secondary | ICD-10-CM

## 2022-07-09 LAB — POCT INFLUENZA A/B
Influenza A, POC: NEGATIVE
Influenza B, POC: NEGATIVE

## 2022-07-09 LAB — POCT RAPID STREP A (OFFICE): Rapid Strep A Screen: NEGATIVE

## 2022-07-09 LAB — POC COVID19 BINAXNOW: SARS Coronavirus 2 Ag: POSITIVE — AB

## 2022-07-09 MED ORDER — AMOXICILLIN 500 MG PO CAPS: 500.0000 mg | ORAL_CAPSULE | Freq: Three times a day (TID) | ORAL | 0 refills | Status: AC

## 2022-07-09 MED ORDER — PREDNISONE 20 MG PO TABS: 40.0000 mg | ORAL_TABLET | Freq: Every day | ORAL | 0 refills | Status: AC

## 2022-07-09 NOTE — Patient Instructions (Addendum)
Take the amoxicillin  Tylenol, garggles  Prednisone 2-5 days-once throat isn't so bad   COVID was trace +

## 2022-07-09 NOTE — Progress Notes (Signed)
Subjective:     Patient ID: Diana Gilmore, female    DOB: Jan 20, 1951, 72 y.o.   MRN: [id number]  Chief Complaint  Patient presents with   Sore Throat   Headache   Cough    Sx started 1 week ago     HPI Sore throat, headache(s), cough for 1 week(s).  Thick mucus. Temperature to 100 2 days ago. No shortness of breath. No v/D.  Hurts to swallow and hoarse for 1 week(s) as well. Marland Kitchen   Health Maintenance Due  Topic Date Due   Medicare Annual Wellness (AWV)  Never done   OPHTHALMOLOGY EXAM  06/18/2017    Past Medical History:  Diagnosis Date   Hyperlipidemia    Hypertension    Thyroid disease     Past Surgical History:  Procedure Laterality Date   THYROID SURGERY      Outpatient Medications Prior to Visit  Medication Sig Dispense Refill   benazepril (LOTENSIN) 10 MG tablet Take 1 tablet (10 mg total) by mouth daily. 90 tablet 3   celecoxib (CELEBREX) 200 MG capsule TAKE 1 CAPSULE BY MOUTH TWICE A DAY 60 capsule 1   levothyroxine (SYNTHROID) 75 MCG tablet Take 1 tablet (75 mcg total) by mouth daily. 90 tablet 3   Multiple Vitamins-Minerals (CENTRUM SILVER 50+WOMEN) TABS Take 1 tablet by mouth.     pravastatin (PRAVACHOL) 20 MG tablet Take 1 tablet (20 mg total) by mouth daily. 90 tablet 1   No facility-administered medications prior to visit.    Allergies  Allergen Reactions   Prednisone Other (See Comments)    dizziness   ROS neg/noncontributory except as noted HPI/below      Objective:     BP 124/72 (BP Location: Left Arm, Patient Position: Sitting, Cuff Size: Normal)   Pulse (!) 103   Temp (!) 97.5 F (36.4 C) (Temporal)   Ht 5\' 1"  (1.549 m)   Wt 121 lb (54.9 kg)   SpO2 95%   BMI 22.86 kg/m  Wt Readings from Last 3 Encounters:  07/09/22 121 lb (54.9 kg)  04/23/22 122 lb 6 oz (55.5 kg)  04/02/22 126 lb 12.8 oz (57.5 kg)    Physical Exam   Gen: WDWN NAD HEENT: NCAT, conjunctiva not injected, sclera nonicteric TM WNL B, OP moist, no exudates  +hoarseness. +erythema Post oropharynx NECK:  supple, no thyromegaly,+ submand nodes\\ CARDIAC: tachy RRR, S1S2+, no murmur.  LUNGS: CTAB. No wheezes EXT:  no edema MSK: no gross abnormalities.  NEURO: A&O x3.  CN II-XII intact.  PSYCH: normal mood. Good eye contact  Results for orders placed or performed in visit on 07/09/22  POC COVID-19  Result Value Ref Range   SARS Coronavirus 2 Ag Positive (A) Negative  POCT Influenza A/B  Result Value Ref Range   Influenza A, POC Negative Negative   Influenza B, POC Negative Negative  POCT rapid strep A  Result Value Ref Range   Rapid Strep A Screen Negative Negative        Assessment & Plan:   Problem List Items Addressed This Visit   None Visit Diagnoses     COVID-19    -  Primary   Sore throat       Relevant Orders   POC COVID-19 (Completed)   POCT Influenza A/B (Completed)   POCT rapid strep A (Completed)   Fever, unspecified fever cause       Relevant Orders   POC COVID-19 (Completed)   POCT Influenza A/B (Completed)  POCT rapid strep A (Completed)      COVID-slightly +-suspect possibly error in reading(but it was there just very faint), or had it and resolving.   Sore throat-post COVID, ?viral, other.  Patient feeling worse, very hard to swallow.  Told me she can take prednisone(all allergy list).  Amox 500tid, prednisone 40 mg daily, garggles.    Meds ordered this encounter  Medications   amoxicillin (AMOXIL) 500 MG capsule    Sig: Take 1 capsule (500 mg total) by mouth 3 (three) times daily for 10 days.    Dispense:  30 capsule    Refill:  0   predniSONE (DELTASONE) 20 MG tablet    Sig: Take 2 tablets (40 mg total) by mouth daily with breakfast for 5 days.    Dispense:  10 tablet    Refill:  0    Angelena Sole, MD

## 2022-07-12 ENCOUNTER — Telehealth: Payer: Self-pay | Admitting: Family Medicine

## 2022-07-12 NOTE — Telephone Encounter (Signed)
Caller states: - Patient hasn't started prednisone as prescribed following OV w/ PCP on 07/09/22 - Patient would like to start out with 20 mg rather than the 40 mg she was instructed to take   Is this okay? Please Advise.

## 2022-07-12 NOTE — Telephone Encounter (Signed)
Patient's daughter informed of message below and verbalized understanding.

## 2022-07-12 NOTE — Telephone Encounter (Signed)
Please advise 

## 2022-07-12 NOTE — Telephone Encounter (Signed)
Patient and daughter notified. Daughter questioned why patient is taking amox if she was negative for strep, should she be taking it. Please advise.

## 2022-07-15 ENCOUNTER — Telehealth: Payer: Self-pay | Admitting: Family Medicine

## 2022-07-15 NOTE — Telephone Encounter (Signed)
Pt would like a call back. She wants to know if she can take Tylenol PM because she can't sleep. Please advise.

## 2022-07-15 NOTE — Telephone Encounter (Signed)
Patient notified of message below and verbalized understanding. 

## 2022-07-15 NOTE — Telephone Encounter (Signed)
Please advise 

## 2022-07-16 ENCOUNTER — Other Ambulatory Visit: Payer: Self-pay | Admitting: Family Medicine

## 2022-07-16 ENCOUNTER — Telehealth: Payer: Self-pay | Admitting: Family Medicine

## 2022-07-16 MED ORDER — HYDROCOD POLI-CHLORPHE POLI ER 10-8 MG/5ML PO SUER: 5.0000 mL | Freq: Two times a day (BID) | ORAL | 0 refills | Status: DC | PRN

## 2022-07-16 NOTE — Telephone Encounter (Signed)
Please advise 

## 2022-07-16 NOTE — Telephone Encounter (Signed)
Patient requests RX for cough medication be sent to: [id number] [place], [place] - [address] Phone: [phone]  Fax: [phone]    FYI-Patient is scheduled for appointment 07/17/22

## 2022-07-17 ENCOUNTER — Ambulatory Visit: Payer: Medicare Other | Admitting: Family Medicine

## 2022-07-17 NOTE — Telephone Encounter (Signed)
Noted  

## 2022-07-22 ENCOUNTER — Telehealth: Payer: Self-pay | Admitting: *Deleted

## 2022-07-22 NOTE — Telephone Encounter (Signed)
Pharmacy faxed over form requesting alternative medication to be sent: ALTERNATIVE REQUESTED: TUSSIONEX ON LONG TERM BACKORDER PLEASE SWITCH TO HYCODAN OR PROMENTHAZINE DM OR BROMFED DM. Please advise.

## 2022-07-23 ENCOUNTER — Other Ambulatory Visit: Payer: Self-pay | Admitting: *Deleted

## 2022-07-23 ENCOUNTER — Other Ambulatory Visit: Payer: Self-pay | Admitting: Family Medicine

## 2022-07-23 ENCOUNTER — Telehealth: Payer: Self-pay | Admitting: Family Medicine

## 2022-07-23 MED ORDER — HYDROCODONE BIT-HOMATROP MBR 5-1.5 MG/5ML PO SOLN: 5.0000 mL | Freq: Three times a day (TID) | ORAL | 0 refills | Status: DC | PRN

## 2022-07-23 MED ORDER — LEVOTHYROXINE SODIUM 75 MCG PO TABS: 75.0000 ug | ORAL_TABLET | Freq: Every day | ORAL | 3 refills | Status: DC

## 2022-07-23 NOTE — Telephone Encounter (Signed)
Patient notified of message below and verbalized understanding. 

## 2022-07-23 NOTE — Telephone Encounter (Signed)
Patient stated she does still need, especially at night.

## 2022-07-23 NOTE — Telephone Encounter (Signed)
error 

## 2022-08-05 ENCOUNTER — Other Ambulatory Visit: Payer: Self-pay | Admitting: Family Medicine

## 2022-08-05 ENCOUNTER — Telehealth: Payer: Self-pay | Admitting: Family Medicine

## 2022-08-05 ENCOUNTER — Encounter: Payer: Self-pay | Admitting: *Deleted

## 2022-08-05 DIAGNOSIS — J4521 Mild intermittent asthma with (acute) exacerbation: Secondary | ICD-10-CM

## 2022-08-05 MED ORDER — ALBUTEROL SULFATE HFA 108 (90 BASE) MCG/ACT IN AERS
2.0000 | INHALATION_SPRAY | RESPIRATORY_TRACT | 1 refills | Status: AC | PRN
Start: 2022-08-05 — End: ?

## 2022-08-05 NOTE — Telephone Encounter (Signed)
Prescription Request  08/05/2022  LOV: 07/09/2022  What is the name of the medication or equipment?  albuterol (PROVENTIL HFA;VENTOLIN HFA) 108 (90 Base) MCG/ACT inhaler (Discontinued)   Have you contacted your pharmacy to request a refill? No   Which pharmacy would you like this sent to?  [id number] [place], [place] - [address] [place] [address] Phone: [phone] Fax: [phone]    Patient notified that their request is being sent to the clinical staff for review and that they should receive a response within 2 business days.   Please advise at Mobile [phone] (mobile)

## 2022-08-05 NOTE — Telephone Encounter (Signed)
Please advise 

## 2022-08-05 NOTE — Telephone Encounter (Signed)
My chart message sent to patient to call office to schedule appt.

## 2022-08-05 NOTE — Telephone Encounter (Signed)
Unable to reach pt , lvm for her to cb and get scheduled for a ov

## 2022-08-12 ENCOUNTER — Telehealth: Payer: Self-pay | Admitting: Family Medicine

## 2022-08-12 NOTE — Telephone Encounter (Signed)
Contacted Diana Gilmore to schedule their annual wellness visit. Appointment made for 08/20/2022.  Gabriel Cirri [facility] AWV TEAM Direct Dial [phone]

## 2022-08-20 ENCOUNTER — Ambulatory Visit: Payer: Medicare Other

## 2022-08-23 ENCOUNTER — Ambulatory Visit: Payer: Medicare Other | Admitting: Family Medicine

## 2022-09-16 DIAGNOSIS — K080 Exfoliation of teeth due to systemic causes: Secondary | ICD-10-CM | POA: Diagnosis not present

## 2022-09-23 DIAGNOSIS — K080 Exfoliation of teeth due to systemic causes: Secondary | ICD-10-CM | POA: Diagnosis not present

## 2022-10-15 ENCOUNTER — Ambulatory Visit: Payer: Medicare Other | Admitting: Family Medicine

## 2022-10-15 ENCOUNTER — Encounter: Payer: Self-pay | Admitting: Family Medicine

## 2022-10-15 VITALS — BP 144/72 | HR 87 | Temp 98.1°F | Resp 18 | Ht 61.0 in | Wt 126.5 lb

## 2022-10-15 DIAGNOSIS — M79601 Pain in right arm: Secondary | ICD-10-CM | POA: Diagnosis not present

## 2022-10-15 MED ORDER — METHYLPREDNISOLONE 4 MG PO TBPK: ORAL_TABLET | ORAL | 0 refills | Status: DC

## 2022-10-15 MED ORDER — MELOXICAM 7.5 MG PO TABS
7.5000 mg | ORAL_TABLET | Freq: Every day | ORAL | 1 refills | Status: DC
Start: 2022-10-15 — End: 2023-04-02

## 2022-10-15 NOTE — Progress Notes (Signed)
Subjective:     Patient ID: Diana Gilmore, female    DOB: [birth year], 71 y.o.   MRN: [id number]  Chief Complaint  Patient presents with   Pain    Pain in right arm and shoulder off and on for 2 weeks    HPI Arm pain - She complains of intermittent pain and weakness in her right arm, onset of 2 weeks. She states the pain begun after carrying 2 packs of meat in a bag in her right arm. She has tried using [employer], which has not helped. Denies any numbness. States she is right handed. Takes meloxicam daily  Shoulder pain - She endorses intermittent shoulder pain starting 2 weeks ago.   Left foot lump - She reports a lump the sole of her left foot a couple of days ago. She states pain when pushing on it, otherwise no pain.   Elevated blood pressure - Her blood pressure was elevated at initial check at 163/93. When rechecked, her BP improved to 144/72. She tends to have lower readings at home.   Health Maintenance Due  Topic Date Due   Medicare Annual Wellness (AWV)  Never done   Colonoscopy  Never done   MAMMOGRAM  Never done   OPHTHALMOLOGY EXAM  06/18/2017   DEXA SCAN  Never done   Diabetic kidney evaluation - Urine ACR  09/11/2022    Past Medical History:  Diagnosis Date   Hyperlipidemia    Hypertension    Thyroid disease     Past Surgical History:  Procedure Laterality Date   THYROID SURGERY       Current Outpatient Medications:    benazepril (LOTENSIN) 10 MG tablet, Take 1 tablet (10 mg total) by mouth daily., Disp: 90 tablet, Rfl: 3   levothyroxine (SYNTHROID) 75 MCG tablet, Take 1 tablet (75 mcg total) by mouth daily., Disp: 90 tablet, Rfl: 3   meloxicam (MOBIC) 7.5 MG tablet, Take 1 tablet (7.5 mg total) by mouth daily., Disp: 30 tablet, Rfl: 1   methylPREDNISolone (MEDROL DOSEPAK) 4 MG TBPK tablet, Take by mouth as directed., Disp: 1 each, Rfl: 0   Multiple Vitamins-Minerals (CENTRUM SILVER 50+WOMEN) TABS, Take 1 tablet by mouth., Disp: , Rfl:    pravastatin  (PRAVACHOL) 20 MG tablet, Take 1 tablet (20 mg total) by mouth daily., Disp: 90 tablet, Rfl: 1   albuterol (VENTOLIN HFA) 108 (90 Base) MCG/ACT inhaler, Inhale 2 puffs into the lungs every 4 (four) hours as needed for wheezing or shortness of breath (cough, shortness of breath or wheezing.). (Patient not taking: Reported on 10/15/2022), Disp: 1 each, Rfl: 1  Allergies  Allergen Reactions   Prednisone Other (See Comments)    dizziness   ROS neg/noncontributory except as noted HPI/below      Objective:     BP (!) 144/72 (BP Location: Left Arm, Patient Position: Sitting, Cuff Size: Large)   Pulse 87   Temp 98.1 F (36.7 C) (Temporal)   Resp 18   Ht 5\' 1"  (1.549 m)   Wt 126 lb 8 oz (57.4 kg)   SpO2 97%   BMI 23.90 kg/m  Wt Readings from Last 3 Encounters:  10/15/22 126 lb 8 oz (57.4 kg)  07/09/22 121 lb (54.9 kg)  04/23/22 122 lb 6 oz (55.5 kg)    Physical Exam   Gen: WDWN NAD HEENT: NCAT, conjunctiva not injected, sclera nonicteric NECK:  supple, no thyromegaly, no nodes, +tight muscles R EXT:  no edema MSK: no gross abnormalities. MS  4+/5 RUE,5/5 LUE. No TTP wrists/elbows.  Some TTP R bicepital groove.  NEURO: A&O x3.  CN II-XII intact.  PSYCH: normal mood. Good eye contact  +Ganglion cyst, right wrist, radial. +Right shoulder muscles are very tight and tender. Weakness in R rotator cuff.  +Tiny nodule in arch of left foot, slightly tender.     Assessment & Plan:  Pain of right upper extremity -     Ambulatory referral to Physical Therapy  Other orders -     methylPREDNISolone; Take by mouth as directed.  Dispense: 1 each; Refill: 0 -     Meloxicam; Take 1 tablet (7.5 mg total) by mouth daily.  Dispense: 30 tablet; Refill: 1   Pain RUE after carrying bulky objects.  Some weakness compared to L-Medrol Dosepak.  Hold meloxicam while using the Medrol.  Will refer to physical therapy.  Also, if things do not improve, needs to follow-up with sports medicine.  Contact  information was given to the patient. Chronic medical problems-advised to schedule follow-up  Return in about 4 weeks (around 11/12/2022) for HTN.   I,Rachel Rivera,acting as a scribe for Angelena Sole, MD.,have documented all relevant documentation on the behalf of Angelena Sole, MD,as directed by  Angelena Sole, MD while in the presence of Angelena Sole, MD.  I, Angelena Sole, MD, have reviewed all documentation for this visit. The documentation on 10/15/22 for the exam, diagnosis, procedures, and orders are all accurate and complete.   Angelena Sole, MD

## 2022-10-15 NOTE — Patient Instructions (Addendum)
[facility] at [facility]  [address] on the 1st floor Phone number [phone]   Medrol dose pack Tennis elbow splint

## 2022-10-17 ENCOUNTER — Encounter: Payer: Self-pay | Admitting: Physical Therapy

## 2022-10-17 ENCOUNTER — Ambulatory Visit: Payer: Medicare Other | Attending: Family Medicine | Admitting: Physical Therapy

## 2022-10-17 ENCOUNTER — Other Ambulatory Visit: Payer: Self-pay

## 2022-10-17 DIAGNOSIS — M6281 Muscle weakness (generalized): Secondary | ICD-10-CM | POA: Diagnosis not present

## 2022-10-17 DIAGNOSIS — R293 Abnormal posture: Secondary | ICD-10-CM

## 2022-10-17 DIAGNOSIS — M79601 Pain in right arm: Secondary | ICD-10-CM

## 2022-10-17 NOTE — Therapy (Signed)
OUTPATIENT PHYSICAL THERAPY UPPER EXTREMITY EVALUATION   Patient Name: Diana Gilmore MRN: [id number] DOB:1951/03/14, 72 y.o., female Today's Date: 10/17/2022  END OF SESSION:  PT End of Session - 10/17/22 0846     Visit Number 1    Date for PT Re-Evaluation 12/12/22    Authorization Type BCBS    PT Start Time 0846    PT Stop Time 0925    PT Time Calculation (min) 39 min    Activity Tolerance Patient tolerated treatment well    Behavior During Therapy [facility] for tasks assessed/performed             Past Medical History:  Diagnosis Date   Hyperlipidemia    Hypertension    Thyroid disease    Past Surgical History:  Procedure Laterality Date   THYROID SURGERY     Patient Active Problem List   Diagnosis Date Noted   Primary osteoarthritis involving multiple joints 09/10/2021   Prediabetes 09/10/2021   Metabolic syndrome X 12/07/2016   Thyroid disease 08/31/2016   Hypertension 08/31/2016   Nuclear sclerotic cataract of both eyes 06/18/2016   Hyperlipemia 12/27/2015   Type 2 diabetes mellitus without complication, without long-term current use of insulin (HCC) 12/27/2015    PCP: Jeani Sow, MD   REFERRING PROVIDER: Jeani Sow, MD   REFERRING DIAG: [phone] (ICD-10-CM) - Pain of right upper extremity   THERAPY DIAG:  Pain in right arm  Muscle weakness (generalized)  Abnormal posture  Rationale for Evaluation and Treatment: Rehabilitation  ONSET DATE: 2-3 WEEKS AGO  SUBJECTIVE:                                                                                                                                                                                      SUBJECTIVE STATEMENT: Patient was carrying 3 big packages of meat and then her arm felt tired. She has pain in the arm and needs to use icy hot at night.She has not taken her medication yet because she is scared to. She feels her hand tingle sometime but this was there before this incident. She reports  it is worse now. She denies neck pain, but then says she is a little sensitive in the post neck. Hand feels weak. Can't pull blanket up. Can't carry food at work.  Hand dominance: Right  PERTINENT HISTORY: HTN, prediabetic  PAIN:  Are you having pain? Yes: NPRS scale: 8-9/10 Pain location: elbow and right shoulder Pain description: sharp Aggravating factors: pronation Relieving factors: icy/hot pad  PRECAUTIONS: None  RED FLAGS: None   WEIGHT BEARING RESTRICTIONS: No  FALLS:  Has patient fallen in last 6 months? 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  PLOF: Independent  PATIENT GOALS: get rid of pain  NEXT MD VISIT: 11/19/22  OBJECTIVE:   DIAGNOSTIC FINDINGS:  none  COGNITION: Overall cognitive status: Within functional limits for tasks assessed     SENSATION: Light touch: Impaired   PATIENT SURVEYS: FOTO TBD    POSTURE: Sits with head in left SB, rounded shoulders, neck flexion  CERVICAL ROM:   Active ROM A/PROM (deg) eval  Flexion full  Extension 25  Right lateral flexion 20  Left lateral flexion 26  Right rotation 56  Left rotation 58   (Blank rows = not tested) /  UPPER EXTREMITY ROM: WNL, some pain with functional IR reaching behind her back   UPPER EXTREMITY MMT: R shoulder, elbow and wrist 4-/5, L shoulder and wrist 4 to 4+/5, elbow 5/5  MMT Right eval Left eval  Grip strength (lbs) 5 23  (Blank rows = not tested)  SHOULDER SPECIAL TESTS: Negative  Cervical Special Tests:  Neg compression/distraction and Spurlings R  PALPATION:  R UT/scalenes marked pain, R pectoralis TTP Edema in right elbow region   TODAY'S TREATMENT:                                                                                                                                         DATE:  10/17/22  PATIENT EDUCATION: Education details: PT eval findings, anticipated POC, initial HEP, and postural awareness  Person educated: Patient Education method: Explanation, Demonstration, and Handouts Education comprehension: verbalized understanding and returned demonstration  HOME EXERCISE PROGRAM: Access Code: [email or web address] URL: [email or web address] Date: 10/17/2022 Prepared by: Raynelle Fanning  Exercises - Seated Scapular Retraction  - 1 x daily - 7 x weekly - 1-3 sets - 10 reps - 2-3 sec hold - Supine Cervical Retraction with Towel  - 1 x daily - 7 x weekly - 2 sets - 10 reps - 5 sec hold - Single Arm Doorway Pec Stretch at 90 Degrees Abduction  - 2 x daily - 7 x weekly - 1 sets - 3 reps - 30 sec hold  ASSESSMENT:  CLINICAL IMPRESSION: Diana Gilmore  is a 72 y.o. 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 She now has N/T down the arm and into the entire hand. She has marked weakness in the RUE including grip and also has tightness in her R pecs and UT/scalenes. She will benefit from skilled PT to address these deficits. She has not started her Prednisone, but PT encouraged her to try it to address inflammation.    OBJECTIVE IMPAIRMENTS: decreased ROM, decreased strength, increased edema, increased muscle spasms, impaired flexibility, impaired UE functional use, postural  dysfunction, and pain.   ACTIVITY LIMITATIONS: carrying, lifting, and hygiene/grooming  PARTICIPATION LIMITATIONS: meal prep, cleaning, and occupation  PERSONAL FACTORS: Fitness and HTN and possible language barrier  are also affecting patient's functional outcome.   REHAB POTENTIAL: Excellent  CLINICAL DECISION MAKING: Evolving/moderate complexity  EVALUATION COMPLEXITY: Moderate  GOALS: Goals reviewed with patient? Yes  SHORT TERM GOALS: Target date: 11/14/2022   Patient will be independent  with initial HEP.  Baseline:  Goal status: INITIAL  2.  Patient to report decreased pain in R UE by 25%  Baseline:  Goal status: INITIAL  3.  Complete FOTO  Baseline:  Goal status: INITIAL   LONG TERM GOALS: Target date: 12/12/2022   Patient will be independent with advanced/ongoing HEP to improve outcomes and carryover.  Baseline:  Goal status: INITIAL  2.  Patient will report 75% improvement in RUE pain to improve QOL.  Baseline:  Goal status: INITIAL  3.  Patient to report improved sensation in R UE by 75% or more Baseline:  Goal status: INITIAL  4.  Patient to improve R grip strength within 5 Lbs of L. Baseline: R = 5; L = 23 Goal status: INITIAL  5.  Patient will demonstrate improved functional UE strength as demonstrated by ability to carry food items at work without difficulty and pull up her covers in bed. Baseline:  Goal status: INITIAL  6.  Patient to demonstrate functional ROM of her R UE without pain. Baseline:  Goal status: INITIAL  7.  Improved FOTO to predicted level showing functional improvement Baseline: TBD Goal status: INITIAL   PLAN: PT FREQUENCY: 2x/week  PT DURATION: 8 weeks  PLANNED INTERVENTIONS: Therapeutic exercises, Therapeutic activity, Neuromuscular re-education, Patient/Family education, Self Care, Joint mobilization, Dry Needling, Electrical stimulation, Spinal mobilization, Cryotherapy, Moist heat, Taping, Traction, Ultrasound, and Manual therapy  PLAN FOR NEXT SESSION: Complete FOTO, STM/IASTM to R UT/neck/pectoralis (pt is afraid of DN), review and progress HEP, nerve glides, postural and R UE strength including grip. Check cervical mobility  Solon Palm, PT 10/17/2022, 10:42 AM

## 2022-10-30 ENCOUNTER — Encounter (INDEPENDENT_AMBULATORY_CARE_PROVIDER_SITE_OTHER): Payer: Self-pay

## 2022-11-06 ENCOUNTER — Ambulatory Visit: Payer: Medicare Other | Attending: Family Medicine

## 2022-11-06 DIAGNOSIS — M25552 Pain in left hip: Secondary | ICD-10-CM | POA: Diagnosis not present

## 2022-11-06 DIAGNOSIS — R293 Abnormal posture: Secondary | ICD-10-CM | POA: Diagnosis not present

## 2022-11-06 DIAGNOSIS — M79601 Pain in right arm: Secondary | ICD-10-CM | POA: Insufficient documentation

## 2022-11-06 DIAGNOSIS — M6281 Muscle weakness (generalized): Secondary | ICD-10-CM | POA: Diagnosis not present

## 2022-11-06 DIAGNOSIS — R252 Cramp and spasm: Secondary | ICD-10-CM | POA: Insufficient documentation

## 2022-11-06 NOTE — Therapy (Signed)
OUTPATIENT PHYSICAL THERAPY UPPER EXTREMITY EVALUATION   Patient Name: Diana Gilmore MRN: [id number] DOB:29-Aug-1950, 72 y.o., female Today's Date: 11/06/2022  END OF SESSION:  PT End of Session - 11/06/22 1147     Visit Number 2    Date for PT Re-Evaluation 12/12/22    Authorization Type BCBS    PT Start Time 1147    PT Stop Time 1230    PT Time Calculation (min) 43 min    Activity Tolerance Patient tolerated treatment well    Behavior During Therapy WFL for tasks assessed/performed             Past Medical History:  Diagnosis Date   Hyperlipidemia    Hypertension    Thyroid disease    Past Surgical History:  Procedure Laterality Date   THYROID SURGERY     Patient Active Problem List   Diagnosis Date Noted   Primary osteoarthritis involving multiple joints 09/10/2021   Prediabetes 09/10/2021   Metabolic syndrome X 12/07/2016   Thyroid disease 08/31/2016   Hypertension 08/31/2016   Nuclear sclerotic cataract of both eyes 06/18/2016   Hyperlipemia 12/27/2015   Type 2 diabetes mellitus without complication, without long-term current use of insulin (HCC) 12/27/2015    PCP: Jeani Sow, MD   REFERRING PROVIDER: Jeani Sow, MD   REFERRING DIAG: [phone] (ICD-10-CM) - Pain of right upper extremity   THERAPY DIAG:  Pain in right arm  Muscle weakness (generalized)  Abnormal posture  Pain in left hip  Cramp and spasm  Rationale for Evaluation and Treatment: Rehabilitation  ONSET DATE: 2-3 WEEKS AGO  SUBJECTIVE:                                                                                                                                                                                      SUBJECTIVE STATEMENT: Patient states she is still having some pain and tingling into arm, mostly in wrist and hand.  Shoulder hurts mostly first thing in the morning and at night.  7-8/10 currently.    Hand dominance: Right  PERTINENT HISTORY: HTN,  prediabetic  PAIN:  11/06/22 Are you having pain? Yes: NPRS scale: 7-8/10 Pain location: elbow and right shoulder Pain description: sharp Aggravating factors: pronation Relieving factors: icy/hot pad  PRECAUTIONS: None  RED FLAGS: None   WEIGHT BEARING RESTRICTIONS: No  FALLS:  Has patient fallen in last 6 months? 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  PLOF: Independent  PATIENT GOALS: get rid of pain  NEXT  MD VISIT: 11/19/22  OBJECTIVE:   DIAGNOSTIC FINDINGS:  none  COGNITION: Overall cognitive status: Within functional limits for tasks assessed     SENSATION: Light touch: Impaired   PATIENT SURVEYS: FOTO 41, predicted 61    POSTURE: Sits with head in left SB, rounded shoulders, neck flexion  CERVICAL ROM:   Active ROM A/PROM (deg) eval  Flexion full  Extension 25  Right lateral flexion 20  Left lateral flexion 26  Right rotation 56  Left rotation 58   (Blank rows = not tested) /  UPPER EXTREMITY ROM: WNL, some pain with functional IR reaching behind her back   UPPER EXTREMITY MMT: R shoulder, elbow and wrist 4-/5, L shoulder and wrist 4 to 4+/5, elbow 5/5  MMT Right eval Left eval  Grip strength (lbs) 5 23  (Blank rows = not tested)  SHOULDER SPECIAL TESTS: Negative  Cervical Special Tests:  Neg compression/distraction and Spurlings R  PALPATION:  R UT/scalenes marked pain, R pectoralis TTP Edema in right elbow region   TODAY'S TREATMENT:                                                                                                                                         DATE: 11/06/22 FOTO completed UBE x 5 min level 1 fwd only Nerve glides right UE (median nerve) x 10 at wall STM to right side neck and upper trap along with upper trap and levator stretches x 5 min Shoulder  tband exercises for postural strengthening: bilateral ext, rows, ER and horizontal abd   DATE: 10/17/22  PATIENT EDUCATION: Education details: PT eval findings, anticipated POC, initial HEP, and postural awareness  Person educated: Patient Education method: Explanation, Demonstration, and Handouts Education comprehension: verbalized understanding and returned demonstration  HOME EXERCISE PROGRAM: Access Code: [email or web address] URL: [email or web address] Date: 10/17/2022 Prepared by: Raynelle Fanning  Exercises - Seated Scapular Retraction  - 1 x daily - 7 x weekly - 1-3 sets - 10 reps - 2-3 sec hold - Supine Cervical Retraction with Towel  - 1 x daily - 7 x weekly - 2 sets - 10 reps - 5 sec hold - Single Arm Doorway Pec Stretch at 90 Degrees Abduction  - 2 x daily - 7 x weekly - 1 sets - 3 reps - 30 sec hold  ASSESSMENT:  CLINICAL IMPRESSION: Patient responded well to dural stretches and manual techniques.  She was able to complete new postural tband exercises without increased symptoms. We added these to HEP.  She is tender at infraspinatus and biceps area.  She may have some rotator cuff pathology.  She may benefit from prone scapular stabilization exercises to isolate the right shoulder issues and correct kinematics of the rotator cuff.      OBJECTIVE IMPAIRMENTS: decreased ROM, decreased strength, increased edema, increased muscle spasms, impaired flexibility, impaired UE functional use, postural dysfunction, and pain.   ACTIVITY LIMITATIONS:  carrying, lifting, and hygiene/grooming  PARTICIPATION LIMITATIONS: meal prep, cleaning, and occupation  PERSONAL FACTORS: Fitness and HTN and possible language barrier  are also affecting patient's functional outcome.   REHAB POTENTIAL: Excellent  CLINICAL DECISION MAKING: Evolving/moderate complexity  EVALUATION COMPLEXITY: Moderate  GOALS: Goals reviewed with patient? Yes  SHORT TERM GOALS: Target date: 11/14/2022   Patient will be  independent with initial HEP.  Baseline:  Goal status: INITIAL  2.  Patient to report decreased pain in R UE by 25%  Baseline:  Goal status: INITIAL  3.  Complete FOTO  Baseline:  Goal status: INITIAL   LONG TERM GOALS: Target date: 12/12/2022   Patient will be independent with advanced/ongoing HEP to improve outcomes and carryover.  Baseline:  Goal status: INITIAL  2.  Patient will report 75% improvement in RUE pain to improve QOL.  Baseline:  Goal status: INITIAL  3.  Patient to report improved sensation in R UE by 75% or more Baseline:  Goal status: INITIAL  4.  Patient to improve R grip strength within 5 Lbs of L. Baseline: R = 5; L = 23 Goal status: INITIAL  5.  Patient will demonstrate improved functional UE strength as demonstrated by ability to carry food items at work without difficulty and pull up her covers in bed. Baseline:  Goal status: INITIAL  6.  Patient to demonstrate functional ROM of her R UE without pain. Baseline:  Goal status: INITIAL  7.  Improved FOTO to predicted level showing functional improvement Baseline: TBD Goal status: INITIAL   PLAN: PT FREQUENCY: 2x/week  PT DURATION: 8 weeks  PLANNED INTERVENTIONS: Therapeutic exercises, Therapeutic activity, Neuromuscular re-education, Patient/Family education, Self Care, Joint mobilization, Dry Needling, Electrical stimulation, Spinal mobilization, Cryotherapy, Moist heat, Taping, Traction, Ultrasound, and Manual therapy  PLAN FOR NEXT SESSION: Continue STM/IASTM to R UT/neck/pectoralis if indicated (pt is afraid of DN), review and progress HEP, nerve glides, postural and R UE strength including grip, possibly add in right shoulder prone scapular stabilization: prone shoulder ext, rows, horizontal abduction, side lying ER and supine serratus punches.  Check cervical mobility  Areliz Rothman B. Brei Pociask, PT 11/06/22 12:34 PM [facility] [address], [address] [place], [place] [address] Phone # [phone] Fax [phone]

## 2022-11-06 NOTE — Therapy (Incomplete)
OUTPATIENT PHYSICAL THERAPY UPPER EXTREMITY TREATMENT   Patient Name: Diana Gilmore MRN: [id number] DOB:23-May-1950, 72 y.o., female Today's Date: 11/06/2022  END OF SESSION:    Past Medical History:  Diagnosis Date   Hyperlipidemia    Hypertension    Thyroid disease    Past Surgical History:  Procedure Laterality Date   THYROID SURGERY     Patient Active Problem List   Diagnosis Date Noted   Primary osteoarthritis involving multiple joints 09/10/2021   Prediabetes 09/10/2021   Metabolic syndrome X 12/07/2016   Thyroid disease 08/31/2016   Hypertension 08/31/2016   Nuclear sclerotic cataract of both eyes 06/18/2016   Hyperlipemia 12/27/2015   Type 2 diabetes mellitus without complication, without long-term current use of insulin (HCC) 12/27/2015    PCP: Jeani Sow, MD   REFERRING PROVIDER: Jeani Sow, MD   REFERRING DIAG: [phone] (ICD-10-CM) - Pain of right upper extremity   THERAPY DIAG:  No diagnosis found.  Rationale for Evaluation and Treatment: Rehabilitation  ONSET DATE: 2-3 WEEKS AGO  SUBJECTIVE:                                                                                                                                                                                      SUBJECTIVE STATEMENT: ***  Hand dominance: Right  PERTINENT HISTORY: HTN, prediabetic  PAIN:  11/06/22 Are you having pain? Yes: NPRS scale: 7-8/10 Pain location: elbow and right shoulder Pain description: sharp Aggravating factors: pronation Relieving factors: icy/hot pad  PRECAUTIONS: None  RED FLAGS: None   WEIGHT BEARING RESTRICTIONS: No  FALLS:  Has patient fallen in last 6 months? 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  PLOF: Independent  PATIENT GOALS: get rid of pain  NEXT MD VISIT:  11/19/22  OBJECTIVE:   DIAGNOSTIC FINDINGS:  none  COGNITION: Overall cognitive status: Within functional limits for tasks assessed     SENSATION: Light touch: Impaired   PATIENT SURVEYS: FOTO 41, predicted 61    POSTURE: Sits with head in left SB, rounded shoulders, neck flexion  CERVICAL ROM:   Active ROM A/PROM (deg) eval  Flexion full  Extension 25  Right lateral flexion 20  Left lateral flexion 26  Right rotation 56  Left rotation 58   (Blank rows = not tested) /  UPPER EXTREMITY ROM: WNL, some pain with functional IR reaching behind her back   UPPER EXTREMITY MMT: R shoulder, elbow and wrist 4-/5, L shoulder and wrist 4 to 4+/5, elbow 5/5  MMT  Right eval Left eval  Grip strength (lbs) 5 23  (Blank rows = not tested)  SHOULDER SPECIAL TESTS: Negative  Cervical Special Tests:  Neg compression/distraction and Spurlings R  PALPATION:  R UT/scalenes marked pain, R pectoralis TTP Edema in right elbow region   TODAY'S TREATMENT:                                                                                                                                         DATE:  11/07/22  UBE x 5 min level 1 fwd only Nerve glides right UE (median nerve) x 10 at wall STM to right side neck and upper trap along with upper trap and levator stretches x 5 min Shoulder tband exercises for postural strengthening: bilateral ext, rows, ER and horizontal abd   11/06/22 FOTO completed UBE x 5 min level 1 fwd only Nerve glides right UE (median nerve) x 10 at wall STM to right side neck and upper trap along with upper trap and levator stretches x 5 min Shoulder tband exercises for postural strengthening: bilateral ext, rows, ER and horizontal abd   DATE: 10/17/22  PATIENT EDUCATION: Education details: PT eval findings, anticipated POC, initial HEP, and postural awareness  Person educated: Patient Education method: Explanation, Demonstration, and Handouts Education  comprehension: verbalized understanding and returned demonstration  HOME EXERCISE PROGRAM: Access Code: [email or web address] URL: [email or web address] Date: 10/17/2022 Prepared by: Raynelle Fanning  Exercises - Seated Scapular Retraction  - 1 x daily - 7 x weekly - 1-3 sets - 10 reps - 2-3 sec hold - Supine Cervical Retraction with Towel  - 1 x daily - 7 x weekly - 2 sets - 10 reps - 5 sec hold - Single Arm Doorway Pec Stretch at 90 Degrees Abduction  - 2 x daily - 7 x weekly - 1 sets - 3 reps - 30 sec hold  ASSESSMENT:  CLINICAL IMPRESSION: Patient responded well to dural stretches and manual techniques.  She was able to complete new postural tband exercises without increased symptoms. We added these to HEP.  She is tender at infraspinatus and biceps area.  She may have some rotator cuff pathology.  She may benefit from prone scapular stabilization exercises to isolate the right shoulder issues and correct kinematics of the rotator cuff.      OBJECTIVE IMPAIRMENTS: decreased ROM, decreased strength, increased edema, increased muscle spasms, impaired flexibility, impaired UE functional use, postural dysfunction, and pain.   ACTIVITY LIMITATIONS: carrying, lifting, and hygiene/grooming  PARTICIPATION LIMITATIONS: meal prep, cleaning, and occupation  PERSONAL FACTORS: Fitness and HTN and possible language barrier  are also affecting patient's functional outcome.   REHAB POTENTIAL: Excellent  CLINICAL DECISION MAKING: Evolving/moderate complexity  EVALUATION COMPLEXITY: Moderate  GOALS: Goals reviewed with patient? Yes  SHORT TERM GOALS: Target date: 11/14/2022   Patient will be independent with initial HEP.  Baseline:  Goal status: INITIAL  2.  Patient to report decreased pain in R UE by 25%  Baseline:  Goal status: INITIAL  3.  Complete FOTO  Baseline:  Goal status: INITIAL   LONG TERM GOALS: Target date: 12/12/2022   Patient will be independent with advanced/ongoing HEP to  improve outcomes and carryover.  Baseline:  Goal status: INITIAL  2.  Patient will report 75% improvement in RUE pain to improve QOL.  Baseline:  Goal status: INITIAL  3.  Patient to report improved sensation in R UE by 75% or more Baseline:  Goal status: INITIAL  4.  Patient to improve R grip strength within 5 Lbs of L. Baseline: R = 5; L = 23 Goal status: INITIAL  5.  Patient will demonstrate improved functional UE strength as demonstrated by ability to carry food items at work without difficulty and pull up her covers in bed. Baseline:  Goal status: INITIAL  6.  Patient to demonstrate functional ROM of her R UE without pain. Baseline:  Goal status: INITIAL  7.  Improved FOTO to predicted level showing functional improvement Baseline: TBD Goal status: INITIAL   PLAN: PT FREQUENCY: 2x/week  PT DURATION: 8 weeks  PLANNED INTERVENTIONS: Therapeutic exercises, Therapeutic activity, Neuromuscular re-education, Patient/Family education, Self Care, Joint mobilization, Dry Needling, Electrical stimulation, Spinal mobilization, Cryotherapy, Moist heat, Taping, Traction, Ultrasound, and Manual therapy  PLAN FOR NEXT SESSION: Continue STM/IASTM to R UT/neck/pectoralis if indicated (pt is afraid of DN), review and progress HEP, nerve glides, postural and R UE strength including grip, possibly add in right shoulder prone scapular stabilization: prone shoulder ext, rows, horizontal abduction, side lying ER and supine serratus punches.  Check cervical mobility  Solon Palm, PT  11/06/22 8:46 PM [facility] [address] [place], [place] [address] Phone # [phone] Fax [phone]

## 2022-11-07 ENCOUNTER — Ambulatory Visit: Payer: Medicare Other | Admitting: Physical Therapy

## 2022-11-08 ENCOUNTER — Encounter: Payer: Medicare Other | Admitting: Physical Therapy

## 2022-11-11 NOTE — Therapy (Addendum)
 OUTPATIENT PHYSICAL THERAPY UPPER EXTREMITY TREATMENT AND DISCHARGE SUMMARY   Patient Name: Diana Gilmore MRN: [id number] DOB:[birth year], 72 y.o., female Today's Date: 11/12/2022  END OF SESSION:  PT End of Session - 11/12/22 0804     Visit Number 3    Date for PT Re-Evaluation 12/12/22    Authorization Type BCBS    PT Start Time 0803    PT Stop Time 0847    PT Time Calculation (min) 44 min    Activity Tolerance Patient tolerated treatment well    Behavior During Therapy [facility] for tasks assessed/performed              Past Medical History:  Diagnosis Date   Hyperlipidemia    Hypertension    Thyroid disease    Past Surgical History:  Procedure Laterality Date   THYROID SURGERY     Patient Active Problem List   Diagnosis Date Noted   Primary osteoarthritis involving multiple joints 09/10/2021   Prediabetes 09/10/2021   Metabolic syndrome X 12/07/2016   Thyroid disease 08/31/2016   Hypertension 08/31/2016   Nuclear sclerotic cataract of both eyes 06/18/2016   Hyperlipemia 12/27/2015   Type 2 diabetes mellitus without complication, without long-term current use of insulin (HCC) 12/27/2015    PCP: Jeani Sow, MD   REFERRING PROVIDER: Jeani Sow, MD   REFERRING DIAG: [phone] (ICD-10-CM) - Pain of right upper extremity   THERAPY DIAG:  Pain in right arm  Muscle weakness (generalized)  Abnormal posture  Pain in left hip  Cramp and spasm  Rationale for Evaluation and Treatment: Rehabilitation  ONSET DATE: 2-3 WEEKS AGO  SUBJECTIVE:                                                                                                                                                                                      SUBJECTIVE STATEMENT: About 10-15% better  Hand dominance: Right  PERTINENT HISTORY: HTN, prediabetic  PAIN:  11/06/22 Are you having pain? Yes: NPRS scale: 7/10 Pain location: elbow and right shoulder Pain description:  sharp Aggravating factors: pronation Relieving factors: icy/hot pad  PRECAUTIONS: None  RED FLAGS: None   WEIGHT BEARING RESTRICTIONS: No  FALLS:  Has patient fallen in last 6 months? 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  PLOF: Independent  PATIENT GOALS: get rid of pain  NEXT MD VISIT: 11/19/22  OBJECTIVE:   DIAGNOSTIC FINDINGS:  none  COGNITION: Overall cognitive status: Within functional limits for tasks assessed     SENSATION:  Light touch: Impaired   PATIENT SURVEYS: FOTO 41, predicted 61    POSTURE: Sits with head in left SB, rounded shoulders, neck flexion  CERVICAL ROM:   Active ROM A/PROM (deg) eval  Flexion full  Extension 25  Right lateral flexion 20  Left lateral flexion 26  Right rotation 56  Left rotation 58   (Blank rows = not tested) /  UPPER EXTREMITY ROM: WNL, some pain with functional IR reaching behind her back   UPPER EXTREMITY MMT: R shoulder, elbow and wrist 4-/5, L shoulder and wrist 4 to 4+/5, elbow 5/5  MMT Right eval Left eval  Grip strength (lbs) 5 23  (Blank rows = not tested)  SHOULDER SPECIAL TESTS: Negative  Cervical Special Tests:  Neg compression/distraction and Spurlings R  PALPATION:  R UT/scalenes marked pain, R pectoralis TTP Edema in right elbow region   TODAY'S TREATMENT:                                                                                                                                         DATE:  11/12/22 UBE x 5 min level 1 fwd only Nerve glides right UE (median nerve) x 3 at wall - stopped due to increased sx Shoulder Redntband exercises for postural strengthening: bilateral ext x 16, rows x 20, ER and horizontal abd x 10 ea Prone scap T, Y x 10 ea B - reports R side is heavy Manual: STM to B UT, levator, scalenes, R wrist  extensors and flexors with passive stretch, prone gentle CPA mobs cervical and thoracic Wrist flex/ext stretch 2x30 sec ea standing and sitting   11/06/22 FOTO completed UBE x 5 min level 1 fwd only Nerve glides right UE (median nerve) x 10 at wall STM to right side neck and upper trap along with upper trap and levator stretches x 5 min Shoulder tband exercises for postural strengthening: bilateral ext, rows, ER and horizontal abd   DATE: 10/17/22  PATIENT EDUCATION: Education details: HEP update  Person educated: Patient Education method: Explanation, Demonstration, and Handouts Education comprehension: verbalized understanding and returned demonstration  HOME EXERCISE PROGRAM: Access Code: [email or web address] URL: [email or web address] Date: 11/12/2022 Prepared by: Raynelle Fanning  Exercises - Seated Scapular Retraction  - 1 x daily - 7 x weekly - 1-3 sets - 10 reps - 2-3 sec hold - Supine Cervical Retraction with Towel  - 1 x daily - 7 x weekly - 2 sets - 10 reps - 5 sec hold - Single Arm Doorway Pec Stretch at 90 Degrees Abduction  - 2 x daily - 7 x weekly - 1 sets - 3 reps - 30 sec hold - Scapular Retraction with Resistance Advanced  - 2 x daily - 7 x weekly - 3 sets - 10 reps - Scapular Retraction with Resistance  - 2 x daily - 7 x weekly - 3 sets - 10 reps - Shoulder  External Rotation and Scapular Retraction with Resistance  - 2 x daily - 7 x weekly - 3 sets - 10 reps - Standing Shoulder Horizontal Abduction with Resistance  - 1 x daily - 7 x weekly - 3 sets - 10 reps - Wrist Flexor Stretch in Pronation  - 2 x daily - 7 x weekly - 1 sets - 3 reps - 30 sec hold - Standing Wrist Flexion Stretch  - 2 x daily - 7 x weekly - 1 sets - 3 reps - 30 sec  hold  ASSESSMENT:  CLINICAL IMPRESSION: Maitri reports 10-15% improvement overall. She continues to have R UE pain and tingling. Median nerve glide was not tolerated today. Very tight in R forearm muscles. Good response to STM and passive  stretches. Wrist stretches issued for HEP. Bowie continues to demonstrate potential for improvement and would benefit from continued skilled therapy to address impairments.     OBJECTIVE IMPAIRMENTS: decreased ROM, decreased strength, increased edema, increased muscle spasms, impaired flexibility, impaired UE functional use, postural dysfunction, and pain.   ACTIVITY LIMITATIONS: carrying, lifting, and hygiene/grooming  PARTICIPATION LIMITATIONS: meal prep, cleaning, and occupation  PERSONAL FACTORS: Fitness and HTN and possible language barrier  are also affecting patient's functional outcome.   REHAB POTENTIAL: Excellent  CLINICAL DECISION MAKING: Evolving/moderate complexity  EVALUATION COMPLEXITY: Moderate  GOALS: Goals reviewed with patient? Yes  SHORT TERM GOALS: Target date: 11/14/2022   Patient will be independent with initial HEP.  Baseline:  Goal status: INITIAL  2.  Patient to report decreased pain in R UE by 25%  Baseline:  Goal status: IN PROGRESS 10-15% better 11/12/22  3.  Complete FOTO  Baseline:  Goal status: MET   LONG TERM GOALS: Target date: 12/12/2022   Patient will be independent with advanced/ongoing HEP to improve outcomes and carryover.  Baseline:  Goal status: INITIAL  2.  Patient will report 75% improvement in RUE pain to improve QOL.  Baseline:  Goal status: INITIAL  3.  Patient to report improved sensation in R UE by 75% or more Baseline:  Goal status: INITIAL  4.  Patient to improve R grip strength within 5 Lbs of L. Baseline: R = 5; L = 23 Goal status: INITIAL  5.  Patient will demonstrate improved functional UE strength as demonstrated by ability to carry food items at work without difficulty and pull up her covers in bed. Baseline:  Goal status: INITIAL  6.  Patient to demonstrate functional ROM of her R UE without pain. Baseline:  Goal status: INITIAL  7.  Improved FOTO to predicted level showing functional  improvement Baseline: TBD Goal status: INITIAL   PLAN: PT FREQUENCY: 2x/week  PT DURATION: 8 weeks  PLANNED INTERVENTIONS: Therapeutic exercises, Therapeutic activity, Neuromuscular re-education, Patient/Family education, Self Care, Joint mobilization, Dry Needling, Electrical stimulation, Spinal mobilization, Cryotherapy, Moist heat, Taping, Traction, Ultrasound, and Manual therapy  PLAN FOR NEXT SESSION: Continue STM/IASTM to R UT/neck/pectoralis if indicated (pt is afraid of DN), review and progress HEP, nerve glides, postural and R UE strength including grip, possibly add in right shoulder prone scapular stabilization: prone shoulder ext, rows, horizontal abduction, side lying ER and supine serratus punches.  Check cervical mobility  Solon Palm, PT  11/12/22 8:48 AM [facility] [address], [address] [place], [place] [address] Phone # [phone] Fax [phone]  PHYSICAL THERAPY DISCHARGE SUMMARY  Visits from Start of Care: 3  Current functional level related to goals / functional outcomes: Unable to assess  as pt did not return for f/u visits.    Remaining deficits: Unable to assess as pt did not return for f/u visits.    Education / Equipment: HEP   Patient agrees to discharge. Patient goals were not met. Patient is being discharged due to not returning since the last visit.  Solon Palm, PT 06/18/23 1:05 PM [facility] [address] [place], [place] [address] Phone # [phone] Fax [phone]

## 2022-11-12 ENCOUNTER — Ambulatory Visit: Payer: Medicare Other | Admitting: Physical Therapy

## 2022-11-12 ENCOUNTER — Encounter: Payer: Self-pay | Admitting: Physical Therapy

## 2022-11-12 DIAGNOSIS — M25552 Pain in left hip: Secondary | ICD-10-CM

## 2022-11-12 DIAGNOSIS — R252 Cramp and spasm: Secondary | ICD-10-CM | POA: Diagnosis not present

## 2022-11-12 DIAGNOSIS — M6281 Muscle weakness (generalized): Secondary | ICD-10-CM | POA: Diagnosis not present

## 2022-11-12 DIAGNOSIS — R293 Abnormal posture: Secondary | ICD-10-CM | POA: Diagnosis not present

## 2022-11-12 DIAGNOSIS — M79601 Pain in right arm: Secondary | ICD-10-CM | POA: Diagnosis not present

## 2022-11-14 ENCOUNTER — Ambulatory Visit: Payer: Medicare Other | Admitting: Physical Therapy

## 2022-11-18 DIAGNOSIS — K080 Exfoliation of teeth due to systemic causes: Secondary | ICD-10-CM | POA: Diagnosis not present

## 2022-11-19 ENCOUNTER — Ambulatory Visit (INDEPENDENT_AMBULATORY_CARE_PROVIDER_SITE_OTHER): Payer: Medicare Other | Admitting: Family Medicine

## 2022-11-19 ENCOUNTER — Encounter: Payer: Self-pay | Admitting: Family Medicine

## 2022-11-19 ENCOUNTER — Telehealth: Payer: Self-pay | Admitting: Family Medicine

## 2022-11-19 VITALS — BP 138/82 | HR 86 | Temp 98.2°F | Resp 16 | Ht 61.0 in | Wt 127.0 lb

## 2022-11-19 DIAGNOSIS — E079 Disorder of thyroid, unspecified: Secondary | ICD-10-CM | POA: Diagnosis not present

## 2022-11-19 DIAGNOSIS — E119 Type 2 diabetes mellitus without complications: Secondary | ICD-10-CM

## 2022-11-19 DIAGNOSIS — I10 Essential (primary) hypertension: Secondary | ICD-10-CM

## 2022-11-19 DIAGNOSIS — I152 Hypertension secondary to endocrine disorders: Secondary | ICD-10-CM

## 2022-11-19 DIAGNOSIS — R29898 Other symptoms and signs involving the musculoskeletal system: Secondary | ICD-10-CM

## 2022-11-19 DIAGNOSIS — E782 Mixed hyperlipidemia: Secondary | ICD-10-CM

## 2022-11-19 DIAGNOSIS — E1159 Type 2 diabetes mellitus with other circulatory complications: Secondary | ICD-10-CM

## 2022-11-19 MED ORDER — BENAZEPRIL HCL 10 MG PO TABS
10.0000 mg | ORAL_TABLET | Freq: Every day | ORAL | 3 refills | Status: DC
Start: 2022-11-19 — End: 2023-08-26

## 2022-11-19 MED ORDER — PRAVASTATIN SODIUM 20 MG PO TABS
20.0000 mg | ORAL_TABLET | Freq: Every day | ORAL | 1 refills | Status: DC
Start: 2022-11-19 — End: 2023-05-30

## 2022-11-19 NOTE — Telephone Encounter (Signed)
ERRONEOUS ENCOUNTER

## 2022-11-19 NOTE — Progress Notes (Signed)
Subjective:     Patient ID: Diana Gilmore, female    DOB: Sep 05, 1950, 72 y.o.   MRN: [id number]  Chief Complaint  Patient presents with   Medical Management of Chronic Issues    4 week follow-up for htn     HPI HTN - Pt is on benazepril 10 mg daily. Bp's running 106/70. Bp at initial check was 140/68. Bp at recheck was 138/82. No ha/palp/edema/cough  HLD - Pt is on pravastatin 20 mg daily. She states sometimes after heavy meals or when she eats more than usual, she takes two.   DM - States she has been decreasing sugar intake.no meds, occ exercise. Advised on the importance of following a healthy diet.  Hypothyroidism - Patient is compliant with levothyroxine 75 mcg daily. No complaints  Neck Tightness and SOB - She states that she occassionally wakes up at night with  neck tightness and SOB. Resolves quickly.  Will be dreaming of choking. Has happened 2-3 times over past several months. She denies fatigue and SOB when walking or during activity.   Health Maintenance Due  Topic Date Due   Medicare Annual Wellness (AWV)  Never done   OPHTHALMOLOGY EXAM  06/18/2017   Diabetic kidney evaluation - Urine ACR  09/11/2022   HEMOGLOBIN A1C  10/22/2022    Past Medical History:  Diagnosis Date   Hyperlipidemia    Hypertension    Thyroid disease     Past Surgical History:  Procedure Laterality Date   THYROID SURGERY       Current Outpatient Medications:    levothyroxine (SYNTHROID) 75 MCG tablet, Take 1 tablet (75 mcg total) by mouth daily., Disp: 90 tablet, Rfl: 3   meloxicam (MOBIC) 7.5 MG tablet, Take 1 tablet (7.5 mg total) by mouth daily. (Patient taking differently: Take 7.5 mg by mouth as needed.), Disp: 30 tablet, Rfl: 1   Multiple Vitamins-Minerals (CENTRUM SILVER 50+WOMEN) TABS, Take 1 tablet by mouth., Disp: , Rfl:    albuterol (VENTOLIN HFA) 108 (90 Base) MCG/ACT inhaler, Inhale 2 puffs into the lungs every 4 (four) hours as needed for wheezing or shortness of breath  (cough, shortness of breath or wheezing.). (Patient not taking: Reported on 10/15/2022), Disp: 1 each, Rfl: 1   benazepril (LOTENSIN) 10 MG tablet, Take 1 tablet (10 mg total) by mouth daily., Disp: 90 tablet, Rfl: 3   pravastatin (PRAVACHOL) 20 MG tablet, Take 1 tablet (20 mg total) by mouth daily., Disp: 90 tablet, Rfl: 1  Allergies  Allergen Reactions   Prednisone Other (See Comments)    dizziness   ROS neg/noncontributory except as noted HPI/below      Objective:     BP 138/82 (BP Location: Left Arm, Patient Position: Sitting, Cuff Size: Large)   Pulse 86   Temp 98.2 F (36.8 C) (Temporal)   Resp 16   Ht 5\' 1"  (1.549 m)   Wt 127 lb (57.6 kg)   SpO2 98%   BMI 24.00 kg/m  Wt Readings from Last 3 Encounters:  11/19/22 127 lb (57.6 kg)  10/15/22 126 lb 8 oz (57.4 kg)  07/09/22 121 lb (54.9 kg)    Physical Exam   Gen: WDWN NAD HEENT: NCAT, conjunctiva not injected, sclera nonicteric NECK:  supple, no thyromegaly, no nodes, no carotid bruits CARDIAC: RRR, S1S2+, no murmur. DP 2+B LUNGS: CTAB. No wheezes ABDOMEN:  BS+, soft, NTND, No HSM, no masses EXT:  no edema MSK: no gross abnormalities.  NEURO: A&O x3.  CN II-XII intact.  PSYCH: normal mood. Good eye contact  Not Fasting - Ate 2 hours ago    Assessment & Plan:  Primary hypertension Assessment & Plan: Chronic.  Controlled.  Occasional low.  Continue benazepril 10 mg daily.  Orders: -     Comprehensive metabolic panel -     CBC with Differential/Platelet -     Microalbumin / creatinine urine ratio  Type 2 diabetes mellitus without complication, without long-term current use of insulin (HCC) Assessment & Plan: Chronic.  She had been prediabetic, but is currently diabetic.  Working some on diet/exercise.  Will get a copy of eye exam.  Prefers no medications.  Will check labs she is on a statin and ACE  Orders: -     Comprehensive metabolic panel -     Hemoglobin A1c -     Microalbumin / creatinine urine  ratio  Thyroid disease Assessment & Plan: Chronic.  Controlled.  Continue Synthroid 0.075 mg daily  Orders: -     TSH  Mixed hyperlipidemia -     Comprehensive metabolic panel -     Lipid panel -     Pravastatin Sodium; Take 1 tablet (20 mg total) by mouth daily.  Dispense: 90 tablet; Refill: 1  Hypertension associated with diabetes (HCC) -     Benazepril HCl; Take 1 tablet (10 mg total) by mouth daily.  Dispense: 90 tablet; Refill: 3  Neck tightness  Neck tightness-? Sleep apnea-has occurred 2-3 times only.  Only at night so doubt CAD.  Keep log-note if sleeping on back.  Could also be nightmares.   Return in about 3 months (around 02/19/2023) for DM, HTN.  I,Anaiya N Rice,acting as a scribe for Angelena Sole, MD.,have documented all relevant documentation on the behalf of Angelena Sole, MD,as directed by  Angelena Sole, MD while in the presence of Angelena Sole, MD.  I, Angelena Sole, MD, have reviewed all documentation for this visit. The documentation on 11/19/22 for the exam, diagnosis, procedures, and orders are all accurate and complete.    Angelena Sole, MD

## 2022-11-19 NOTE — Assessment & Plan Note (Signed)
Chronic.  Controlled.  Continue Synthroid 0.075 mg daily

## 2022-11-19 NOTE — Assessment & Plan Note (Signed)
Chronic.  She had been prediabetic, but is currently diabetic.  Working some on diet/exercise.  Will get a copy of eye exam.  Prefers no medications.  Will check labs she is on a statin and ACE

## 2022-11-19 NOTE — Assessment & Plan Note (Signed)
Chronic.  Controlled.  Occasional low.  Continue benazepril 10 mg daily.

## 2022-11-20 LAB — COMPREHENSIVE METABOLIC PANEL
ALT: 17 U/L (ref 0–35)
AST: 22 U/L (ref 0–37)
Albumin: 4.6 g/dL (ref 3.5–5.2)
Alkaline Phosphatase: 52 U/L (ref 39–117)
BUN: 22 mg/dL (ref 6–23)
CO2: 28 meq/L (ref 19–32)
Calcium: 9.7 mg/dL (ref 8.4–10.5)
Chloride: 103 meq/L (ref 96–112)
Creatinine, Ser: 0.87 mg/dL (ref 0.40–1.20)
GFR: 67.6 mL/min (ref >60.00)
Glucose, Bld: 163 mg/dL — ABNORMAL HIGH (ref 70–99)
Potassium: 3.9 mEq/L (ref 3.5–5.1)
Sodium: 139 meq/L (ref 135–145)
Total Bilirubin: 0.5 mg/dL (ref 0.2–1.2)
Total Protein: 7.4 g/dL (ref 6.0–8.3)

## 2022-11-20 LAB — CBC WITH DIFFERENTIAL/PLATELET
Basophils Absolute: 0 10*3/uL (ref 0.0–0.1)
Basophils Relative: 0.5 % (ref 0.0–3.0)
Eosinophils Absolute: 0.1 10*3/uL (ref 0.0–0.7)
Eosinophils Relative: 2.3 % (ref 0.0–5.0)
HCT: 38.4 % (ref 36.0–46.0)
Hemoglobin: 12.5 g/dL (ref 12.0–15.0)
Lymphocytes Relative: 42.7 % (ref 12.0–46.0)
Lymphs Abs: 2.6 10*3/uL (ref 0.7–4.0)
MCHC: 32.6 g/dL (ref 30.0–36.0)
MCV: 90.4 fl (ref 78.0–100.0)
Monocytes Absolute: 0.4 10*3/uL (ref 0.1–1.0)
Monocytes Relative: 6.5 % (ref 3.0–12.0)
Neutro Abs: 2.9 10*3/uL (ref 1.4–7.7)
Neutrophils Relative %: 48 % (ref 43.0–77.0)
Platelets: 327 10*3/uL (ref 150.0–400.0)
RBC: 4.25 Mil/uL (ref 3.87–5.11)
RDW: 12.7 % (ref 11.5–15.5)
WBC: 6.2 10*3/uL (ref 4.0–10.5)

## 2022-11-20 LAB — MICROALBUMIN / CREATININE URINE RATIO
Creatinine,U: 52.2 mg/dL
Microalb Creat Ratio: 5.2 mg/g (ref 0.0–30.0)
Microalb, Ur: 2.7 mg/dL — ABNORMAL HIGH (ref 0.0–1.9)

## 2022-11-20 LAB — LIPID PANEL
Cholesterol: 202 mg/dL — ABNORMAL HIGH (ref 0–200)
HDL: 46.2 mg/dL (ref >39.00)
NonHDL: 156.25
Total CHOL/HDL Ratio: 4
Triglycerides: 294 mg/dL — ABNORMAL HIGH (ref 0.0–149.0)
VLDL: 58.8 mg/dL — ABNORMAL HIGH (ref 0.0–40.0)

## 2022-11-20 LAB — LDL CHOLESTEROL, DIRECT: Direct LDL: 141 mg/dL

## 2022-11-20 LAB — HEMOGLOBIN A1C: Hgb A1c MFr Bld: 6.6 % — ABNORMAL HIGH (ref 4.6–6.5)

## 2022-11-22 LAB — TSH: TSH: 8.96 u[IU]/mL — ABNORMAL HIGH (ref 0.35–5.50)

## 2022-12-03 ENCOUNTER — Other Ambulatory Visit: Payer: Self-pay | Admitting: *Deleted

## 2022-12-03 MED ORDER — LEVOTHYROXINE SODIUM 100 MCG PO TABS: 100.0000 ug | ORAL_TABLET | Freq: Every day | ORAL | 0 refills | Status: DC

## 2022-12-03 NOTE — Progress Notes (Signed)
Cholesterol too high-increase pravastatin to 40mg  daily Sugars better.  Keep working on it [employer] she missed doses? If no, increase to 0.1mg 

## 2022-12-04 ENCOUNTER — Ambulatory Visit: Payer: Medicare Other

## 2022-12-10 ENCOUNTER — Encounter: Payer: Medicare Other | Admitting: Physical Therapy

## 2022-12-12 ENCOUNTER — Encounter: Payer: Medicare Other | Admitting: Physical Therapy

## 2022-12-16 DIAGNOSIS — K080 Exfoliation of teeth due to systemic causes: Secondary | ICD-10-CM | POA: Diagnosis not present

## 2023-01-06 DIAGNOSIS — H40033 Anatomical narrow angle, bilateral: Secondary | ICD-10-CM | POA: Diagnosis not present

## 2023-01-06 DIAGNOSIS — H2513 Age-related nuclear cataract, bilateral: Secondary | ICD-10-CM | POA: Diagnosis not present

## 2023-02-25 ENCOUNTER — Ambulatory Visit: Payer: Medicare Other | Admitting: Family Medicine

## 2023-02-25 ENCOUNTER — Encounter: Payer: Self-pay | Admitting: Family Medicine

## 2023-02-25 VITALS — BP 132/78 | HR 85 | Temp 98.3°F | Resp 16 | Ht 61.0 in | Wt 125.5 lb

## 2023-02-25 DIAGNOSIS — E782 Mixed hyperlipidemia: Secondary | ICD-10-CM | POA: Diagnosis not present

## 2023-02-25 DIAGNOSIS — I10 Essential (primary) hypertension: Secondary | ICD-10-CM

## 2023-02-25 DIAGNOSIS — E079 Disorder of thyroid, unspecified: Secondary | ICD-10-CM

## 2023-02-25 DIAGNOSIS — E119 Type 2 diabetes mellitus without complications: Secondary | ICD-10-CM | POA: Diagnosis not present

## 2023-02-25 NOTE — Patient Instructions (Signed)
It was very nice to see you today!  Tumeric and ginger daily for joint pains-capsules.    PLEASE NOTE:  If you had any lab tests please let us know if you have not heard back within a few days. You may see your results on MyChart before we have a chance to review them but we will give you a call once they are reviewed by [place]. If we ordered any referrals today, please let us know if you have not heard from their office within the next week.   Please try these tips to maintain a healthy lifestyle:  Eat most of your calories during the day when you are active. Eliminate processed foods including packaged sweets (pies, cakes, cookies), reduce intake of potatoes, white bread, white pasta, and white rice. Look for whole grain options, oat flour or almond flour.  Each meal should contain half fruits/vegetables, one quarter protein, and one quarter carbs (no bigger than a computer mouse).  Cut down on sweet beverages. This includes juice, soda, and sweet tea. Also watch fruit intake, though this is a healthier sweet option, it still contains natural sugar! Limit to 3 servings daily.  Drink at least 1 glass of water with each meal and aim for at least 8 glasses per day  Exercise at least 150 minutes every week.

## 2023-02-25 NOTE — Assessment & Plan Note (Signed)
Chronic.  Not controlled.  Pravastatin 20mg  added last ov(supposed to be 40mg ).  Suspect some language barriers.  Check labs

## 2023-02-25 NOTE — Assessment & Plan Note (Signed)
Chronic.  Controlled.  Continue benazepril 10 mg daily.

## 2023-02-25 NOTE — Assessment & Plan Note (Signed)
Chronic.  Wasn't Controlled.  Continue Synthroid 0.075 mg daily

## 2023-02-25 NOTE — Progress Notes (Signed)
Subjective:     Patient ID: Diana Gilmore, female    DOB: [birth year], 72 y.o.   MRN: [id number]  Chief Complaint  Patient presents with   Medical Management of Chronic Issues    Follow-up on htn, dm     HPI  DM, type 2 - Not currently treating with medications. Pt occasionally exercising, walking. Has been working on her diet. Has cut down on carbs/starches. Not checking her sugars at home.   HTN - Pt is on Benazepril 10 mg daily. Bp's running 120-130s/70-80s. At initial check her BP today was 152/85. When rechecked, her BP improved to 132/78. She has not yet brought her BP cuff into to confirm its accuracy. She reports a systolic reading of 142 during a headache. Her symptoms resolved after lying down and resting. The next day her systolic BP improved to 132. No dizziness/cp/palp/edema/cough/sob.    Osteoarthritis - She is taking Meloxicam 7.5 mg occ. She endorses joint pain with occasional numbness and tingling. About 2 months ago, she stopped Meloxicam due to dizziness. Currently, she has been managing her pain with Advil gel capsules. Pt has not tried tumeric and ginger supplements.  Hypothyroidism - Pt taking Synthroid 75 mcg daily.   HLD - She is on Pravastatin 20 mg once daily. After pt left, supposed to be 40mg   Pt is non-fasting today. Last ate bananas and oatmeal around 12:45 pm. Reports she drank plenty of water this morning.   Health Maintenance Due  Topic Date Due   Medicare Annual Wellness (AWV)  Never done    Past Medical History:  Diagnosis Date   Hyperlipidemia    Hypertension    Thyroid disease     Past Surgical History:  Procedure Laterality Date   THYROID SURGERY       Current Outpatient Medications:    albuterol (VENTOLIN HFA) 108 (90 Base) MCG/ACT inhaler, Inhale 2 puffs into the lungs every 4 (four) hours as needed for wheezing or shortness of breath (cough, shortness of breath or wheezing.)., Disp: 1 each, Rfl: 1   benazepril (LOTENSIN) 10 MG  tablet, Take 1 tablet (10 mg total) by mouth daily., Disp: 90 tablet, Rfl: 3   levothyroxine (SYNTHROID) 75 MCG tablet, Take 75 mcg by mouth daily., Disp: , Rfl:    meloxicam (MOBIC) 7.5 MG tablet, Take 1 tablet (7.5 mg total) by mouth daily. (Patient taking differently: Take 7.5 mg by mouth as needed.), Disp: 30 tablet, Rfl: 1   Multiple Vitamins-Minerals (CENTRUM SILVER 50+WOMEN) TABS, Take 1 tablet by mouth., Disp: , Rfl:    pravastatin (PRAVACHOL) 20 MG tablet, Take 1 tablet (20 mg total) by mouth daily., Disp: 90 tablet, Rfl: 1  Allergies  Allergen Reactions   Prednisone Other (See Comments)    dizziness   ROS neg/noncontributory except as noted HPI/below      Objective:     BP 132/78 (BP Location: Left Arm, Patient Position: Sitting, Cuff Size: Large)   Pulse 85   Temp 98.3 F (36.8 C) (Temporal)   Resp 16   Ht 5\' 1"  (1.549 m)   Wt 125 lb 8 oz (56.9 kg)   SpO2 97%   BMI 23.71 kg/m  Wt Readings from Last 3 Encounters:  02/25/23 125 lb 8 oz (56.9 kg)  11/19/22 127 lb (57.6 kg)  10/15/22 126 lb 8 oz (57.4 kg)    Physical Exam   Gen: WDWN NAD HEENT: NCAT, conjunctiva not injected, sclera nonicteric NECK:  supple, no thyromegaly, no nodes, no  carotid bruits CARDIAC: RRR, S1S2+, no murmur. DP 2+B LUNGS: CTAB. No wheezes ABDOMEN:  BS+, soft, NTND, No HSM, no masses EXT:  no edema MSK: no gross abnormalities.  NEURO: A&O x3.  CN II-XII intact.  PSYCH: normal mood. Good eye contact     Assessment & Plan:  Type 2 diabetes mellitus without complication, without long-term current use of insulin (HCC) Assessment & Plan: Chronic.  Controlled.  Working some on diet/exercise.Marland Kitchen  Prefers no medications.  Will check labs she is on a statin and ACE  Orders: -     Comprehensive metabolic panel -     Hemoglobin A1c  Primary hypertension Assessment & Plan: Chronic.  Controlled.  Continue benazepril 10 mg daily.   Mixed hyperlipidemia Assessment & Plan: Chronic.  Not  controlled.  Pravastatin 20mg  added last ov(supposed to be 40mg ).  Suspect some language barriers.  Check labs  Orders: -     Comprehensive metabolic panel -     Lipid panel  Thyroid disease Assessment & Plan: Chronic.  Wasn't Controlled.  Continue Synthroid 0.075 mg daily  Orders: -     TSH   Return in about 6 months (around 08/25/2023) for chronic follow-up.  I, Isabelle Course, acting as a scribe for Angelena Sole, MD., have documented all relevant documentation on the behalf of Angelena Sole, MD, as directed by  Angelena Sole, MD while in the presence of Angelena Sole, MD.  I, Angelena Sole, MD, have reviewed all documentation for this visit. The documentation on 02/25/23 for the exam, diagnosis, procedures, and orders are all accurate and complete.  Angelena Sole, MD

## 2023-02-25 NOTE — Assessment & Plan Note (Addendum)
Chronic.  Controlled.  Working some on diet/exercise.Marland Kitchen  Prefers no medications.  Will check labs she is on a statin and ACE

## 2023-02-26 ENCOUNTER — Other Ambulatory Visit: Payer: Self-pay | Admitting: *Deleted

## 2023-02-26 LAB — COMPREHENSIVE METABOLIC PANEL
ALT: 22 U/L (ref 0–35)
AST: 23 U/L (ref 0–37)
Albumin: 4.7 g/dL (ref 3.5–5.2)
Alkaline Phosphatase: 52 U/L (ref 39–117)
BUN: 19 mg/dL (ref 6–23)
CO2: 27 meq/L (ref 19–32)
Calcium: 9.9 mg/dL (ref 8.4–10.5)
Chloride: 104 meq/L (ref 96–112)
Creatinine, Ser: 0.82 mg/dL (ref 0.40–1.20)
GFR: 72.44 mL/min (ref >60.00)
Glucose, Bld: 143 mg/dL — ABNORMAL HIGH (ref 70–99)
Potassium: 3.9 meq/L (ref 3.5–5.1)
Sodium: 138 meq/L (ref 135–145)
Total Bilirubin: 0.5 mg/dL (ref 0.2–1.2)
Total Protein: 7.4 g/dL (ref 6.0–8.3)

## 2023-02-26 LAB — HEMOGLOBIN A1C: Hgb A1c MFr Bld: 6.6 % — ABNORMAL HIGH (ref 4.6–6.5)

## 2023-02-26 LAB — LIPID PANEL
Cholesterol: 193 mg/dL (ref 0–200)
HDL: 42.1 mg/dL (ref >39.00)
LDL Cholesterol: 99 mg/dL (ref 0–99)
NonHDL: 150.54
Total CHOL/HDL Ratio: 5
Triglycerides: 259 mg/dL — ABNORMAL HIGH (ref 0.0–149.0)
VLDL: 51.8 mg/dL — ABNORMAL HIGH (ref 0.0–40.0)

## 2023-02-26 LAB — TSH: TSH: 5.14 u[IU]/mL (ref 0.35–5.50)

## 2023-02-26 MED ORDER — LEVOTHYROXINE SODIUM 100 MCG PO TABS: 100.0000 ug | ORAL_TABLET | Freq: Every day | ORAL | 1 refills | Status: DC

## 2023-02-26 NOTE — Progress Notes (Signed)
1.  Sugars same-keep working on diet/exercise 2.  Trigs still elevated.  Take otc fish oil 2000mg  daily.  Verify if taking pravastatin 20mg  or 40mg (may need rx) 3.  Thyroid still underdosed.  Increase to 0.1 #90/1

## 2023-04-02 ENCOUNTER — Other Ambulatory Visit: Payer: Self-pay | Admitting: Family Medicine

## 2023-05-20 DIAGNOSIS — K080 Exfoliation of teeth due to systemic causes: Secondary | ICD-10-CM | POA: Diagnosis not present

## 2023-05-30 ENCOUNTER — Other Ambulatory Visit: Payer: Self-pay | Admitting: Family Medicine

## 2023-05-30 DIAGNOSIS — E782 Mixed hyperlipidemia: Secondary | ICD-10-CM

## 2023-07-08 DIAGNOSIS — K080 Exfoliation of teeth due to systemic causes: Secondary | ICD-10-CM | POA: Diagnosis not present

## 2023-07-24 IMAGING — CR DG FOOT COMPLETE 3+V*R*
3 series · 3 of 3 positions shown · non-contrast
Comparison: None.

CLINICAL DATA: Fall, initial encounter. Pain. Worsening bony
tenderness of the medial malleolus tip and navicular bone.

EXAM:
RIGHT FOOT COMPLETE - 3+ VIEW

[x foot ap right]
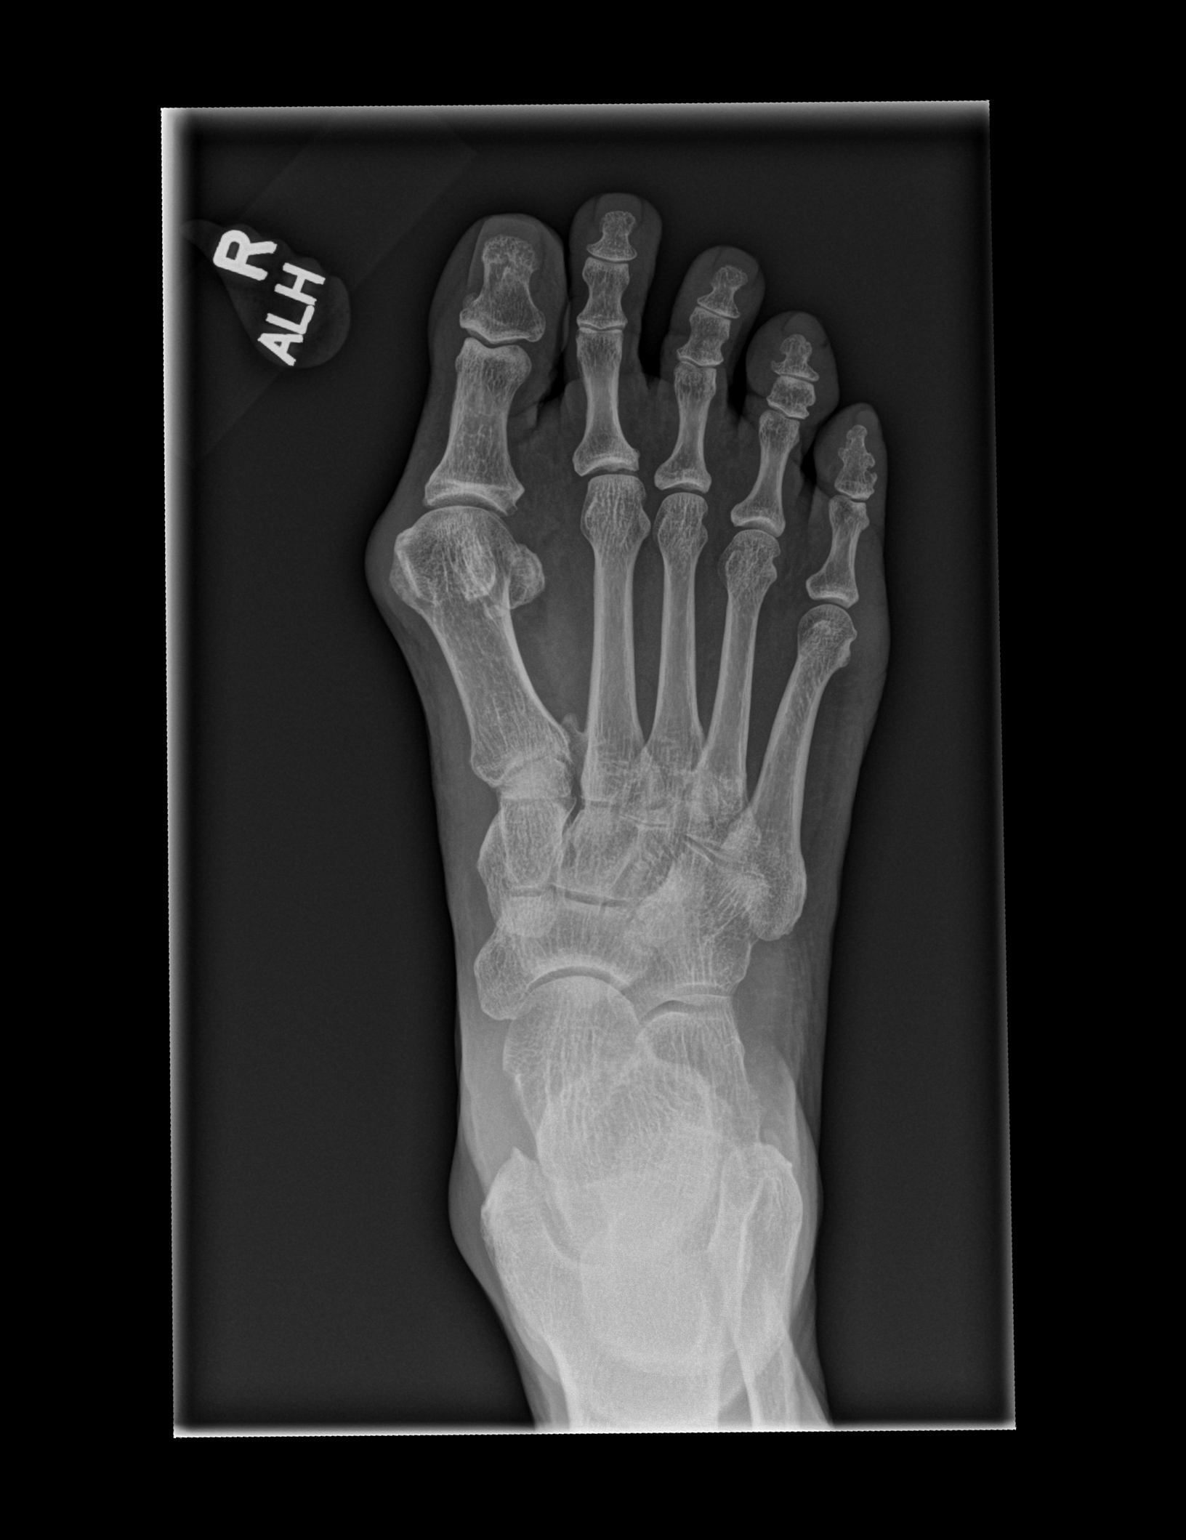

[x foot obl right]
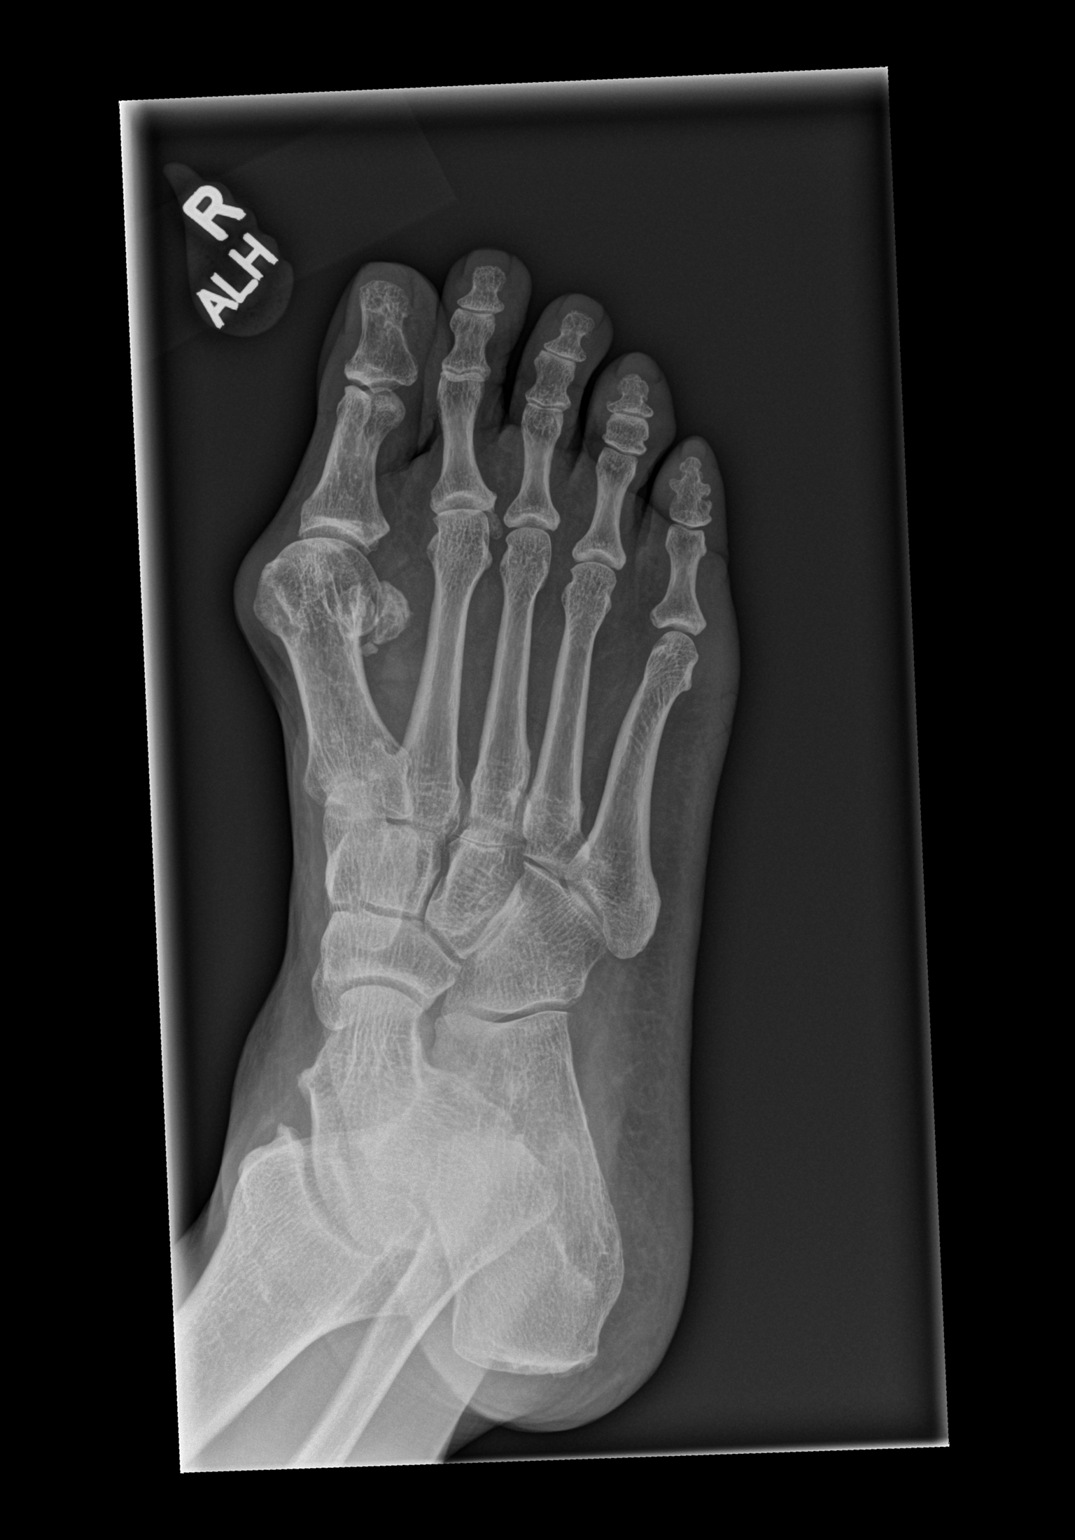

[x foot lat right]
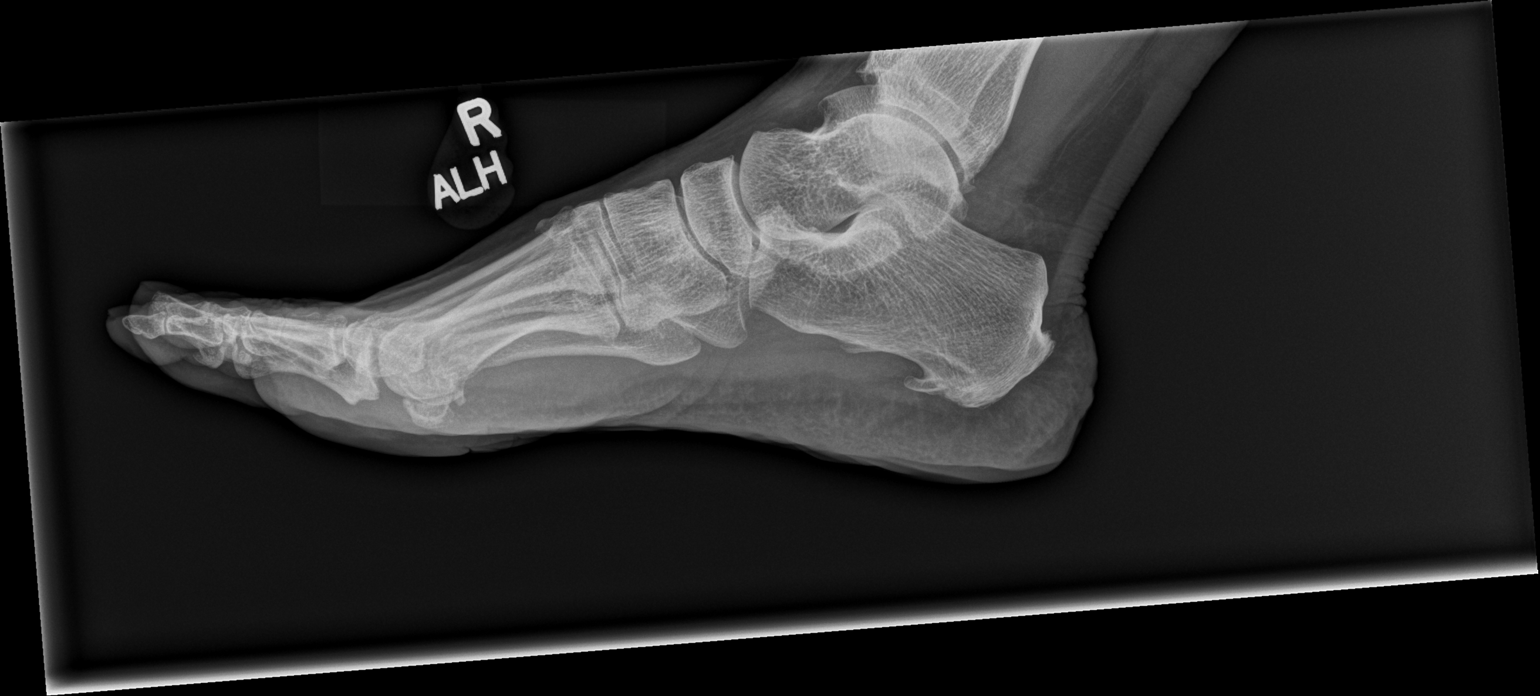

[3 of 3 positions shown; findings below may reference images not displayed]

FINDINGS: Hallux valgus with osteoarthritis of the first metatarsal phalangeal
joint. There is well-defined osseous density at the base of the
second metatarsal in the region of the Lisfranc ligament, however
this appears to project dorsally on the lateral view. No definite
list frank joint widening. No evidence of fracture. No erosion. Mild
soft tissue thickening medial to the first metatarsal phalangeal
joint
IMPRESSION: 1. Well-defined osseous density at the base of the second metatarsal
in the region of the Lisfranc ligament, however no joint space
widening. This appears to project dorsally on the lateral view.
Sequela of Lisfranc injury is difficult to exclude. Consider further
assessment with MRI based on clinical concern.
2. Hallux valgus with osteoarthritis of the first
metatarsophalangeal joint.

## 2023-08-04 DIAGNOSIS — K080 Exfoliation of teeth due to systemic causes: Secondary | ICD-10-CM | POA: Diagnosis not present

## 2023-08-18 DIAGNOSIS — K080 Exfoliation of teeth due to systemic causes: Secondary | ICD-10-CM | POA: Diagnosis not present

## 2023-08-26 ENCOUNTER — Encounter: Payer: Self-pay | Admitting: Family Medicine

## 2023-08-26 ENCOUNTER — Ambulatory Visit: Payer: Medicare Other | Admitting: Family Medicine

## 2023-08-26 VITALS — BP 120/64 | HR 85 | Temp 98.1°F | Resp 16 | Ht 61.0 in | Wt 124.5 lb

## 2023-08-26 DIAGNOSIS — E782 Mixed hyperlipidemia: Secondary | ICD-10-CM | POA: Diagnosis not present

## 2023-08-26 DIAGNOSIS — I10 Essential (primary) hypertension: Secondary | ICD-10-CM | POA: Diagnosis not present

## 2023-08-26 DIAGNOSIS — E1159 Type 2 diabetes mellitus with other circulatory complications: Secondary | ICD-10-CM | POA: Diagnosis not present

## 2023-08-26 DIAGNOSIS — E079 Disorder of thyroid, unspecified: Secondary | ICD-10-CM | POA: Diagnosis not present

## 2023-08-26 DIAGNOSIS — I152 Hypertension secondary to endocrine disorders: Secondary | ICD-10-CM

## 2023-08-26 DIAGNOSIS — E119 Type 2 diabetes mellitus without complications: Secondary | ICD-10-CM

## 2023-08-26 LAB — MICROALBUMIN / CREATININE URINE RATIO
Creatinine,U: 44.7 mg/dL
Microalb Creat Ratio: 66.7 mg/g — ABNORMAL HIGH (ref 0.0–30.0)
Microalb, Ur: 3 mg/dL — ABNORMAL HIGH (ref 0.0–1.9)

## 2023-08-26 MED ORDER — LEVOTHYROXINE SODIUM 100 MCG PO TABS: 100.0000 ug | ORAL_TABLET | Freq: Every day | ORAL | 1 refills | Status: DC

## 2023-08-26 MED ORDER — PRAVASTATIN SODIUM 20 MG PO TABS
20.0000 mg | ORAL_TABLET | Freq: Every day | ORAL | 1 refills | Status: DC
Start: 2023-08-26 — End: 2024-02-22

## 2023-08-26 MED ORDER — NAPROXEN 500 MG PO TABS: 500.0000 mg | ORAL_TABLET | Freq: Two times a day (BID) | ORAL | 1 refills | Status: DC | PRN

## 2023-08-26 MED ORDER — BENAZEPRIL HCL 10 MG PO TABS: 10.0000 mg | ORAL_TABLET | Freq: Every day | ORAL | 1 refills | Status: DC

## 2023-08-26 NOTE — Patient Instructions (Addendum)
 It was very nice to see you today!  Advil 2-4 tabs every 8 hrs if needed.  See eye doctor   PLEASE NOTE:  If you had any lab tests please let us  know if you have not heard back within a few days. You may see your results on MyChart before we have a chance to review them but we will give you a call once they are reviewed by us . If we ordered any referrals today, please let us  know if you have not heard from their office within the next week.   Please try these tips to maintain a healthy lifestyle:  Eat most of your calories during the day when you are active. Eliminate processed foods including packaged sweets (pies, cakes, cookies), reduce intake of potatoes, white bread, white pasta, and white rice. Look for whole grain options, oat flour or almond flour.  Each meal should contain half fruits/vegetables, one quarter protein, and one quarter carbs (no bigger than a computer mouse).  Cut down on sweet beverages. This includes juice, soda, and sweet tea. Also watch fruit intake, though this is a healthier sweet option, it still contains natural sugar! Limit to 3 servings daily.  Drink at least 1 glass of water with each meal and aim for at least 8 glasses per day  Exercise at least 150 minutes every week.

## 2023-08-26 NOTE — Progress Notes (Signed)
 Subjective:     Patient ID: Diana Gilmore, female    DOB: 01-Mar-1951, 73 y.o.   MRN: [id number]  Chief Complaint  Patient presents with   Medical Management of Chronic Issues    6 month follow-up      HPI Dhhthy Discussed the use of AI scribe software for clinical note transcription with the patient, who gave verbal consent to proceed.  History of Present Illness Diana Gilmore is a 73 year old female who presents for routine follow-up.  She recently had her teeth removed and braces placed, which has affected her eating habits.  For diabetes management, she is not on medication and relies on diet, fasting, and running. She does not monitor her blood glucose at home due to the lack of a machine and not wanting to. There is a history of proteinuria, and she is currently taking benazepril  10 mg daily for renal protection. We discussed possibly adding SGLT-2  Her home blood pressure readings are generally around 125-130 mmHg, though she notes higher readings after driving or poor sleep. She did not bring her blood pressure cuff to the visit. Taking benazepril  10mg  daily and no ha/dizziness,cp,cough/sob  She experiences itchy eyes and ears, attributing these symptoms to allergies. She uses a nasal spray and takes claritin for relief.  She is on pravastatin  20 mg daily for hyperlipidemia and reports no muscle aches. She regularly drinks ginger and turmeric tea for inflammation.  She experiences joint swelling and previously took meloxicam , which caused dizziness. She now uses ibuprofen or Tylenol as needed for pain relief.  She takes levothyroxine  100 mcg daily following a partial thyroidectomy in 1997, with no current issues reported.  She last saw an eye doctor last year but was dissatisfied with the care and plans to return to her previous doctor. She does not undergo regular mammograms or colonoscopies, expressing a preference to avoid these procedures.  No bad headaches, dizziness, chest  pain, heart racing, coughing, or shortness of breath.    Health Maintenance Due  Topic Date Due   Medicare Annual Wellness (AWV)  Never done   OPHTHALMOLOGY EXAM  03/05/2023   FOOT EXAM  04/24/2023   HEMOGLOBIN A1C  08/25/2023    Past Medical History:  Diagnosis Date   Hyperlipidemia    Hypertension    Thyroid  disease     Past Surgical History:  Procedure Laterality Date   THYROID  SURGERY       Current Outpatient Medications:    albuterol  (VENTOLIN  HFA) 108 (90 Base) MCG/ACT inhaler, Inhale 2 puffs into the lungs every 4 (four) hours as needed for wheezing or shortness of breath (cough, shortness of breath or wheezing.)., Disp: 1 each, Rfl: 1   Multiple Vitamins-Minerals (CENTRUM SILVER 50+WOMEN) TABS, Take 1 tablet by mouth., Disp: , Rfl:    naproxen (NAPROSYN) 500 MG tablet, Take 1 tablet (500 mg total) by mouth 2 (two) times daily as needed., Disp: 60 tablet, Rfl: 1   benazepril  (LOTENSIN ) 10 MG tablet, Take 1 tablet (10 mg total) by mouth daily., Disp: 90 tablet, Rfl: 1   levothyroxine  (SYNTHROID ) 100 MCG tablet, Take 1 tablet (100 mcg total) by mouth daily., Disp: 90 tablet, Rfl: 1   pravastatin  (PRAVACHOL ) 20 MG tablet, Take 1 tablet (20 mg total) by mouth daily., Disp: 90 tablet, Rfl: 1  Allergies  Allergen Reactions   Prednisone  Other (See Comments)    dizziness   ROS neg/noncontributory except as noted HPI/below      Objective:  BP 120/64 (BP Location: Left Arm, Patient Position: Sitting, Cuff Size: Normal)   Pulse 85   Temp 98.1 F (36.7 C) (Temporal)   Resp 16   Ht 5\' 1"  (1.549 m)   Wt 124 lb 8 oz (56.5 kg)   SpO2 95%   BMI 23.52 kg/m  Wt Readings from Last 3 Encounters:  08/26/23 124 lb 8 oz (56.5 kg)  02/25/23 125 lb 8 oz (56.9 kg)  11/19/22 127 lb (57.6 kg)    Physical Exam   Gen: WDWN NAD HEENT: NCAT, conjunctiva not injected, sclera nonicteric NECK:  supple, no thyromegaly, no nodes, no carotid bruits CARDIAC: RRR, S1S2+, no  murmur. DP 2+B LUNGS: CTAB. No wheezes ABDOMEN:  BS+, soft, NTND, No HSM, no masses EXT:  no edema MSK: no gross abnormalities.  NEURO: A&O x3.  CN II-XII intact.  PSYCH: normal mood. Good eye contact     Assessment & Plan:  Type 2 diabetes mellitus without complication, without long-term current use of insulin (HCC) -     Hemoglobin A1c -     Comprehensive metabolic panel with GFR -     Microalbumin / creatinine urine ratio  Primary hypertension -     CBC with Differential/Platelet -     Comprehensive metabolic panel with GFR  Mixed hyperlipidemia -     Comprehensive metabolic panel with GFR -     Lipid panel -     Pravastatin  Sodium; Take 1 tablet (20 mg total) by mouth daily.  Dispense: 90 tablet; Refill: 1  Thyroid  disease -     T4, free -     TSH -     Levothyroxine  Sodium; Take 1 tablet (100 mcg total) by mouth daily.  Dispense: 90 tablet; Refill: 1  Hypertension associated with diabetes (HCC) -     Benazepril  HCl; Take 1 tablet (10 mg total) by mouth daily.  Dispense: 90 tablet; Refill: 1  Other orders -     Naproxen; Take 1 tablet (500 mg total) by mouth 2 (two) times daily as needed.  Dispense: 60 tablet; Refill: 1  Assessment and Plan Assessment & Plan Type 2 diabetes mellitus without complications   Type 2 diabetes mellitus is managed with diet and exercise, with no current medication. [name] is considered for its benefits in managing diabetes and providing kidney protection by promoting glucose excretion through urine. Discuss the potential initiation of Jardiance and perform a urine test to assess current kidney function.  Proteinuria   Proteinuria is likely related to long-standing diabetes and hypertension, managed with benazepril  for kidney protection. Jardiance may offer additional kidney protection and potentially reduce proteinuria. Continue benazepril  10 mg daily, perform a urine test to reassess protein levels, and discuss the potential initiation of  Jardiance.  Hyperlipidemia, unspecified   Hyperlipidemia is managed with pravastatin  20 mg daily, with no reported muscle aches or adverse effects. Limit grapefruit intake to half a grapefruit once or twice a week to avoid interaction with pravastatin . Continue pravastatin  20 mg daily.  Hypothyroidism, post surg   Hypothyroidism is managed with levothyroxine  100 mcg daily, with no issues reported. Continue levothyroxine  100 mcg daily.  Allergic rhinitis   Allergic rhinitis causes itchy eyes and ears, managed with allergy spray and claritin. Use olive oil on a Q-tip for dry, itchy ears to provide moisture and alleviate symptoms. Continue allergy spray and claritin as needed.     Return in about 6 months (around 02/26/2024) for chronic follow-up.  Ellsworth Haas, MD

## 2023-08-27 ENCOUNTER — Ambulatory Visit: Payer: Self-pay | Admitting: Family Medicine

## 2023-08-27 DIAGNOSIS — E119 Type 2 diabetes mellitus without complications: Secondary | ICD-10-CM

## 2023-08-27 LAB — COMPREHENSIVE METABOLIC PANEL WITH GFR
ALT: 17 U/L (ref 0–35)
AST: 18 U/L (ref 0–37)
Albumin: 4.7 g/dL (ref 3.5–5.2)
Alkaline Phosphatase: 61 U/L (ref 39–117)
BUN: 26 mg/dL — ABNORMAL HIGH (ref 6–23)
CO2: 31 meq/L (ref 19–32)
Calcium: 10.5 mg/dL (ref 8.4–10.5)
Chloride: 101 meq/L (ref 96–112)
Creatinine, Ser: 0.75 mg/dL (ref 0.40–1.20)
GFR: 80.34 mL/min (ref >60.00)
Glucose, Bld: 142 mg/dL — ABNORMAL HIGH (ref 70–99)
Potassium: 4.1 meq/L (ref 3.5–5.1)
Sodium: 140 meq/L (ref 135–145)
Total Bilirubin: 0.4 mg/dL (ref 0.2–1.2)
Total Protein: 7.4 g/dL (ref 6.0–8.3)

## 2023-08-27 LAB — CBC WITH DIFFERENTIAL/PLATELET
Basophils Absolute: 0 10*3/uL (ref 0.0–0.1)
Basophils Relative: 0.4 % (ref 0.0–3.0)
Eosinophils Absolute: 0.1 10*3/uL (ref 0.0–0.7)
Eosinophils Relative: 1.9 % (ref 0.0–5.0)
HCT: 39.3 % (ref 36.0–46.0)
Hemoglobin: 13.1 g/dL (ref 12.0–15.0)
Lymphocytes Relative: 40.6 % (ref 12.0–46.0)
Lymphs Abs: 2.9 10*3/uL (ref 0.7–4.0)
MCHC: 33.3 g/dL (ref 30.0–36.0)
MCV: 86.8 fl (ref 78.0–100.0)
Monocytes Absolute: 0.4 10*3/uL (ref 0.1–1.0)
Monocytes Relative: 5.6 % (ref 3.0–12.0)
Neutro Abs: 3.7 10*3/uL (ref 1.4–7.7)
Neutrophils Relative %: 51.5 % (ref 43.0–77.0)
Platelets: 335 10*3/uL (ref 150.0–400.0)
RBC: 4.53 Mil/uL (ref 3.87–5.11)
RDW: 12.5 % (ref 11.5–15.5)
WBC: 7.2 10*3/uL (ref 4.0–10.5)

## 2023-08-27 LAB — LIPID PANEL
Cholesterol: 177 mg/dL (ref 0–200)
HDL: 46.6 mg/dL (ref >39.00)
LDL Cholesterol: 53 mg/dL (ref 0–99)
NonHDL: 129.93
Total CHOL/HDL Ratio: 4
Triglycerides: 387 mg/dL — ABNORMAL HIGH (ref 0.0–149.0)
VLDL: 77.4 mg/dL — ABNORMAL HIGH (ref 0.0–40.0)

## 2023-08-27 LAB — HEMOGLOBIN A1C: Hgb A1c MFr Bld: 6.6 % — ABNORMAL HIGH (ref 4.6–6.5)

## 2023-08-27 LAB — TSH: TSH: 0.09 u[IU]/mL — ABNORMAL LOW (ref 0.35–5.50)

## 2023-08-27 LAB — T4, FREE: Free T4: 1.27 ng/dL (ref 0.60–1.60)

## 2023-08-27 NOTE — Progress Notes (Signed)
 Labs ok except the microalbumin in the urine.  Send in Progress 10mg  daily #30/5 and needs to repeat bmp in 1 month-lab only

## 2023-08-28 MED ORDER — EMPAGLIFLOZIN 10 MG PO TABS: 10.0000 mg | ORAL_TABLET | Freq: Every day | ORAL | 5 refills | Status: DC

## 2023-08-30 ENCOUNTER — Other Ambulatory Visit: Payer: Self-pay | Admitting: Family Medicine

## 2023-08-30 DIAGNOSIS — E079 Disorder of thyroid, unspecified: Secondary | ICD-10-CM

## 2023-09-01 ENCOUNTER — Telehealth: Payer: Self-pay | Admitting: *Deleted

## 2023-09-01 NOTE — Telephone Encounter (Signed)
 Copied from CRM [id number]. Topic: General - Other >> Sep 01, 2023  4:30 PM Aisha D wrote: Reason for CRM: Dest[place] with [facility] stated that the PA request for the  JARDIANCE  tier exception was denied.

## 2023-09-02 ENCOUNTER — Encounter: Payer: Self-pay | Admitting: Family Medicine

## 2023-09-02 ENCOUNTER — Ambulatory Visit (INDEPENDENT_AMBULATORY_CARE_PROVIDER_SITE_OTHER): Admitting: Family Medicine

## 2023-09-02 VITALS — BP 130/70 | HR 76 | Temp 97.5°F | Resp 16 | Ht 61.0 in | Wt 124.5 lb

## 2023-09-02 DIAGNOSIS — E1129 Type 2 diabetes mellitus with other diabetic kidney complication: Secondary | ICD-10-CM

## 2023-09-02 DIAGNOSIS — Z7984 Long term (current) use of oral hypoglycemic drugs: Secondary | ICD-10-CM

## 2023-09-02 DIAGNOSIS — E119 Type 2 diabetes mellitus without complications: Secondary | ICD-10-CM

## 2023-09-02 DIAGNOSIS — R809 Proteinuria, unspecified: Secondary | ICD-10-CM | POA: Diagnosis not present

## 2023-09-02 MED ORDER — DAPAGLIFLOZIN PROPANEDIOL 5 MG PO TABS: 5.0000 mg | ORAL_TABLET | Freq: Every day | ORAL | 1 refills | Status: DC

## 2023-09-02 NOTE — Progress Notes (Signed)
 Subjective:     Patient ID: Diana Gilmore, female    DOB: [birth year], 73 y.o.   MRN: [id number]  Chief Complaint  Patient presents with   Results    Patient here to discuss recent results with pcp     HPI Discussed the use of AI scribe software for clinical note transcription with the patient, who gave verbal consent to proceed.  History of Present Illness Diana Gilmore is a 73 year old female with diabetes and microalbuminuria who presents for a follow-up visit.  She is experiencing financial difficulties with her medication costs, having been unexpectedly charged over $400 for a prescription, which she finds unaffordable for Jardiance . Her insurance network includes [employer], but the cost remains high there as well. She is awaiting a response regarding potential coverage adjustments.  Her diabetes management is ongoing, with a recent A1c level of 6.6. She is currently taking benazepril  for blood pressure control and kidney protection. She reports a urine microalb/creat ratio of 66 and is concerned about protein leakage in her urine and its implications for kidney function.  She is trying to increase her water intake to one to two liters daily, as advised by her daughter, due to previously neglecting hydration because of her busy work schedule.  She is currently taking benazepril , monitoring her blood pressure regularly, which is usually around 132/80. She is also on levothyroxine  for thyroid  management, which she plans to refill at [facility].  She discusses the financial burden of her medications, particularly Jardiance , which is not covered by her insurance without meeting high deductibles. She is considering alternative medications like Farxiga, pending insurance approval and cost assessment.    Health Maintenance Due  Topic Date Due   Medicare Annual Wellness (AWV)  Never done   OPHTHALMOLOGY EXAM  03/05/2023   FOOT EXAM  04/24/2023    Past Medical History:  Diagnosis Date    Hyperlipidemia    Hypertension    Thyroid  disease     Past Surgical History:  Procedure Laterality Date   THYROID  SURGERY       Current Outpatient Medications:    albuterol  (VENTOLIN  HFA) 108 (90 Base) MCG/ACT inhaler, Inhale 2 puffs into the lungs every 4 (four) hours as needed for wheezing or shortness of breath (cough, shortness of breath or wheezing.)., Disp: 1 each, Rfl: 1   benazepril  (LOTENSIN ) 10 MG tablet, Take 1 tablet (10 mg total) by mouth daily., Disp: 90 tablet, Rfl: 1   dapagliflozin propanediol (FARXIGA) 5 MG TABS tablet, Take 1 tablet (5 mg total) by mouth daily., Disp: 30 tablet, Rfl: 1   levothyroxine  (SYNTHROID ) 100 MCG tablet, Take 1 tablet by mouth once daily, Disp: 90 tablet, Rfl: 1   Multiple Vitamins-Minerals (CENTRUM SILVER 50+WOMEN) TABS, Take 1 tablet by mouth., Disp: , Rfl:    naproxen  (NAPROSYN ) 500 MG tablet, Take 1 tablet (500 mg total) by mouth 2 (two) times daily as needed., Disp: 60 tablet, Rfl: 1   pravastatin  (PRAVACHOL ) 20 MG tablet, Take 1 tablet (20 mg total) by mouth daily., Disp: 90 tablet, Rfl: 1  Allergies  Allergen Reactions   Prednisone  Other (See Comments)    dizziness   ROS neg/noncontributory except as noted HPI/below      Objective:      BP 130/70   Pulse 76   Temp (!) 97.5 F (36.4 C) (Temporal)   Resp 16   Ht 5\' 1"  (1.549 m)   Wt 124 lb 8 oz (56.5 kg)   SpO2 97%  BMI 23.52 kg/m  Wt Readings from Last 3 Encounters:  09/02/23 124 lb 8 oz (56.5 kg)  08/26/23 124 lb 8 oz (56.5 kg)  02/25/23 125 lb 8 oz (56.9 kg)    Physical Exam   Gen: WDWN NAD HEENT: NCAT, conjunctiva not injected, sclera nonicteric EXT:  no edema MSK: no gross abnormalities.  NEURO: A&O x3.  CN II-XII intact.  PSYCH: normal mood. Good eye contact  Reviewed labs w/pt  Spent w/pt discussing labs, etiologies, concerns, meds, plan.     Assessment & Plan:  Type 2 diabetes mellitus without complication, without long-term current use of  insulin (HCC)  Microalbuminuria due to type 2 diabetes mellitus (HCC)  Other orders -     Dapagliflozin Propanediol; Take 1 tablet (5 mg total) by mouth daily.  Dispense: 30 tablet; Refill: 1  Assessment and Plan Assessment & Plan Diabetes Mellitus Type 2   Diabetes is well-managed with an A1c of 6.6. no meds for DM-just diet. Current treatment includes benazepril  for renal protection/HTN. Insurance issues for Jardiance  were discussed, and Diana Gilmore was considered as an alternative, but cost remains a concern. She is advised to maintain good hydration and avoid nephrotoxic medications. Jardiance  and Farxiga offer renal and cardiac protection, though the exact mechanism is not fully understood. She expressed concern about the cost of long-term medication use. Continue benazepril  for blood pressure and renal protection. Attempt to prescribe Farxiga and check affordability at the pharmacy. If affordable, will fill, if not, understand.   Advise to maintain adequate hydration and avoid nephrotoxic medications such as ibuprofen and naproxen .  microalbuminuria   Early kidney damage is indicated by a urine microalb/creat ratio of 66. Emphasis was placed on protecting the kidneys to prevent further damage. Benazepril  offers renal protection. The potential benefits of SGLT2 inhibitors like Jardiance  and Farxiga for renal protection were discussed, but cost is a barrier. Adequate hydration is essential to prevent the kidneys from becoming overworked. Continue benazepril  for renal protection. Attempt to prescribe Farxiga and check affordability at the pharmacy. Advise to drink 8 glasses of water daily to maintain kidney function. Monitor kidney function with blood tests if new medication is started.  Hypertension   Hypertension is well-controlled with benazepril , and blood pressure readings are within the target range. Maintaining blood pressure control is crucial to protect kidney function. Continue benazepril   for blood pressure control. Monitor blood pressure regularly.  Hypothyroidism   She is on levothyroxine  for hypothyroidism. Prescription refills and ensuring the medication is sent to the correct pharmacy were discussed. Ensure levothyroxine  prescription is sent to [facility] for refill.  Follow-up   Routine monitoring and assessment of the effectiveness of any new medications if started are advised. Schedule follow-up appointment in December.    Return for as sch in Dec.  Ellsworth Haas, MD

## 2023-09-02 NOTE — Patient Instructions (Signed)
 If farxiga is too expensive as well, then don't do and don't need to do labs in 1 month  Make sure drinking enough water.

## 2023-09-12 ENCOUNTER — Ambulatory Visit: Payer: Self-pay

## 2023-09-12 NOTE — Telephone Encounter (Signed)
 FYI Only or Action Required?: Action required by provider  Patient was last seen in primary care on 09/02/2023 by Christel Cousins, MD. Called Nurse Triage reporting Medication Problem. Symptoms began several days ago. Interventions attempted: Other: wants to use old cough medicine for cough. Symptoms are: unchanged.Cough sinus congestion  Triage Disposition: Home Care  Patient/caregiver understands and will follow disposition?: yes. Pt wants to know if it is ok to use old cough medicine from last year. Pt would like to be seen for cough. Appt made for 09/15/2023       Reason for Disposition  [1] Caller has URGENT medicine question about med that PCP or specialist prescribed AND [2] triager unable to answer question  Cough  Answer Assessment - Initial Assessment Questions 1. NAME of MEDICINE: What medicine(s) are you calling about?     Cough medicine 2. QUESTION: What is your question? (e.g., double dose of medicine, side effect)     Wants to know if she can use old medicine 3. PRESCRIBER: Who prescribed the medicine? Reason: if prescribed by specialist, call should be referred to that group.     PCP 4. SYMPTOMS: Do you have any symptoms? If Yes, ask: What symptoms are you having?  How bad are the symptoms (e.g., mild, moderate, severe)     Cough -  Answer Assessment - Initial Assessment Questions 1. ONSET: When did the cough begin?      A few days ago 2. SEVERITY: How bad is the cough today?      moderate 3. SPUTUM: Describe the color of your sputum (none, dry cough; clear, white, yellow, green)     Clear thick 4. HEMOPTYSIS: Are you coughing up any blood? If so ask: How much? (flecks, streaks, tablespoons, etc.)     no 5. DIFFICULTY BREATHING: Are you having difficulty breathing? If Yes, ask: How bad is it? (e.g., mild, moderate, severe)    - MILD: No SOB at rest, mild SOB with walking, speaks normally in sentences, can lie down, no retractions, pulse <  100.    - MODERATE: SOB at rest, SOB with minimal exertion and prefers to sit, cannot lie down flat, speaks in phrases, mild retractions, audible wheezing, pulse 100-120.    - SEVERE: Very SOB at rest, speaks in single words, struggling to breathe, sitting hunched forward, retractions, pulse > 120      no 6. FEVER: Do you have a fever? If Yes, ask: What is your temperature, how was it measured, and when did it start?     no 10. OTHER SYMPTOMS: Do you have any other symptoms? (e.g., runny nose, wheezing, chest pain)       Cough - congestion  Protocols used: Medication Question Call-A-AH, Cough - Acute Productive-A-AH

## 2023-09-12 NOTE — Telephone Encounter (Signed)
 Left message to return my call.

## 2023-09-12 NOTE — Telephone Encounter (Addendum)
  3 attempts to triage. Unable to reach pt. Please advise.     Copied from CRM [id number]. Topic: Clinical - Medication Question >> Sep 12, 2023  9:20 AM Diana Gilmore wrote: Reason for CRM: Patient said she has cough syrup from last year that was prescribed by her doctor and she is wanting to know if it is okay for her to drink it? (hydrocodone  cough syrup)

## 2023-09-12 NOTE — Telephone Encounter (Signed)
 Patient scheduled with Trevor Fudge on 09/15/23.

## 2023-09-13 DIAGNOSIS — J988 Other specified respiratory disorders: Secondary | ICD-10-CM | POA: Diagnosis not present

## 2023-09-15 ENCOUNTER — Ambulatory Visit: Admitting: Family

## 2023-09-16 DIAGNOSIS — K080 Exfoliation of teeth due to systemic causes: Secondary | ICD-10-CM | POA: Diagnosis not present

## 2024-01-14 DIAGNOSIS — H35373 Puckering of macula, bilateral: Secondary | ICD-10-CM | POA: Diagnosis not present

## 2024-01-16 NOTE — Progress Notes (Signed)
 Diana Gilmore                                          MRN: [id number]   01/16/2024   The [facility] Quality Team Specialist reviewed this patient medical record for the purposes of chart review for care gap closure. The following were reviewed: chart review for care gap closure-controlling blood pressure. BP 145/77    [facility] Quality Team

## 2024-01-21 ENCOUNTER — Ambulatory Visit

## 2024-02-04 ENCOUNTER — Telehealth: Payer: Self-pay

## 2024-02-04 NOTE — Telephone Encounter (Signed)
 PLEASE ADVISE    Copied from CRM #[id number]. Topic: Referral - Request for Referral >> Feb 03, 2024  2:46 PM Ashley R wrote: Did the patient discuss referral with their provider in the last year? Yes, but this is for finger.  Appointment offered? Yes  Type of order/referral and detailed reason for visit: Physical Therapy  Preference of office, provider, location: [facility]  If referral order, have you been seen by this specialty before? Yes   Can we respond through MyChart? Yes

## 2024-02-04 NOTE — Telephone Encounter (Signed)
 Called pt left message for return call smk

## 2024-02-06 ENCOUNTER — Ambulatory Visit (INDEPENDENT_AMBULATORY_CARE_PROVIDER_SITE_OTHER)

## 2024-02-06 ENCOUNTER — Ambulatory Visit: Payer: Self-pay

## 2024-02-06 ENCOUNTER — Encounter: Payer: Self-pay | Admitting: Family Medicine

## 2024-02-06 ENCOUNTER — Ambulatory Visit: Admitting: Family Medicine

## 2024-02-06 VITALS — BP 156/83 | HR 81 | Temp 98.4°F | Ht 61.0 in | Wt 128.2 lb

## 2024-02-06 DIAGNOSIS — M79641 Pain in right hand: Secondary | ICD-10-CM

## 2024-02-06 DIAGNOSIS — M154 Erosive (osteo)arthritis: Secondary | ICD-10-CM | POA: Diagnosis not present

## 2024-02-06 DIAGNOSIS — I10 Essential (primary) hypertension: Secondary | ICD-10-CM | POA: Diagnosis not present

## 2024-02-06 NOTE — Patient Instructions (Signed)
 It was very nice to see you today!  VISIT SUMMARY: Today, we discussed your worsening hand pain, swelling, and numbness. We are investigating the possibility of rheumatoid arthritis and have ordered blood work and an x-ray to help with the diagnosis.  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 We suspect rheumatoid arthritis due to your symptoms and joint appearance. -We have ordered blood work and an x-ray to evaluate for rheumatoid arthritis. -You are referred to physical therapy for hand arthritis management. -We discussed the short-term use of meloxicam  or prednisone  for inflammation control, but be cautious of potential side effects. -Continue using ice packs for symptomatic relief. -If rheumatoid arthritis is confirmed, we will consider a referral to a rheumatologist.  Return if symptoms worsen or fail to improve.   Take care, Dr Kennyth  PLEASE NOTE:  If you had any lab tests, please let us  know if you have not heard back within a few days. You may see your results on mychart before we have a chance to review them but we will give you a call once they are reviewed by us .   If we ordered any referrals today, please let us  know if you have not heard from their office within the next week.   If you had any urgent prescriptions sent in today, please check with the pharmacy within an hour of our visit to make sure the prescription was transmitted appropriately.   Please try these tips to maintain a healthy lifestyle:  Eat at least 3 REAL meals and 1-2 snacks per day.  Aim for no more than 5 hours between eating.  If you eat breakfast, please do so within one hour of getting up.   Each meal should contain half fruits/vegetables, one quarter protein, and one quarter carbs (no bigger than a computer mouse)  Cut down on sweet beverages. This includes juice, soda, and sweet tea.   Drink at least 1 glass of  water with each meal and aim for at least 8 glasses per day  Exercise at least 150 minutes every week.

## 2024-02-06 NOTE — Progress Notes (Signed)
   Diana Gilmore is a 73 y.o. female who presents today for an office visit.  Assessment/Plan:  Hand Pain  She likely does have some underlying degenerative changes however concern for possible rheumatoid arthritis given her degree of joint hypertrophy and ulnar deviation.  She was previously on meloxicam  however discontinued due to issues with her kidneys.  She is having some numbness and tingling which is consistent with carpal tunnel syndrome.  We did discuss trial of prednisone  however she declined due to previous issues with dizziness.  She would like a referral to physical therapy and we will place this today.  It is okay for her to use Tylenol as needed.  We will also check labs today including CBC, c-Met, sed rate, CRP, and rheumatoid factor.  Also check plain films today.  Depending on results of this may consider referral to rheumatology if concerning for rheumatoid arthritis or refer to sports medicine if above is negative.  Hypertension Elevated today and confirmed on recheck.  Blood pressure was at goal a few months ago with most recent office visit with her PCP.  She is typically well-controlled.  Will continue benazepril  10 mg daily and she will follow-up with PCP if persistently elevated.     Subjective:  HPI:  See assessment / plan for status of chronic conditions.   Discussed the use of AI scribe software for clinical note transcription with the patient, who gave verbal consent to proceed.  History of Present Illness Diana Gilmore is a 73 year old female with arthritis who presents with worsening hand pain and swelling.  She has experienced numbness and swelling in her hand for the past six months, with the pain intensifying recently. The pain has significantly affected her ability to perform daily activities, particularly at her job in [employer] where she has worked for over 35 years.  Her arthritis was diagnosed six months ago, and she has not undergone any x-rays yet.  She previously used a laser machine for treatment without relief. She has tried Advil cream at night and uses ice packs, which provide some relief. She was on meloxicam  in the past but discontinued it due to potential kidney issues, as she has proteinuria. She also experienced dizziness with prednisone , which was prescribed for sciatica pain in the past.  She experiences numbness and tingling in her hand, which has worsened over the past few days. No redness or additional swelling beyond what she describes as 'just swollen'.  She has a family history of arthritis.         Objective:  Physical Exam: BP (!) 156/83   Pulse 81   Temp 98.4 F (36.9 C)   Ht 5' 1 (1.549 m)   Wt 128 lb 3.2 oz (58.2 kg)   SpO2 96%   BMI 24.22 kg/m   Gen: No acute distress, resting comfortably MUSCULOSKELETAL - Hands: Bilateral hands with hypertrophic MCP joints bilaterally.  Right hand with hypertrophic fifth PIP joint with ulnar deviation.  Tenderness to palpation along essentially all joints. Neuro: Grossly normal, moves all extremities Psych: Normal affect and thought content      Jama Mcmiller M. Kennyth, MD 02/06/2024 2:46 PM

## 2024-02-06 NOTE — Telephone Encounter (Signed)
 FYI Only or Action Required?: FYI only for provider: appointment scheduled on This afternoon.  Patient was last seen in primary care on 09/02/2023 by Diana Jenkins Jansky, MD.  Called Nurse Triage reporting Hand pain.  Symptoms began several days ago.  Interventions attempted: OTC medications: tylenol and IBU.  Symptoms are: gradually worsening.  Triage Disposition: See PCP When Office is Open (Within 3 Days)  Patient/caregiver understands and will follow disposition?: Yes                     Copied from CRM [id number]. Topic: Clinical - Red Word Triage >> Feb 06, 2024 10:56 AM Harlene ORN wrote: Red Word that prompted transfer to Nurse Triage:  swelling in her hand and fingers, spreading to her wrist Reason for Disposition  [1] MODERATE pain (e.g., interferes with normal activities) AND [2] present > 3 days  Answer Assessment - Initial Assessment Questions 1. ONSET: When did the pain start?     2 days ago 2. LOCATION: Where is the pain located?     Right hand and wrist 3. PAIN: How bad is the pain? (Scale 1-10; or mild, moderate, severe)   - MILD (1-3): doesn't interfere with normal activities   - MODERATE (4-7): interferes with normal activities (e.g., work or school) or awakens from sleep   - SEVERE (8-10): excruciating pain, unable to use hand at all     7-8/10 4. WORK OR EXERCISE: Has there been any recent work or exercise that involved this part (i.e., hand or wrist) of the body?     unsure 5. CAUSE: What do you think is causing the pain?     work 6. AGGRAVATING FACTORS: What makes the pain worse? (e.g., using computer)     Using hand 7. OTHER SYMPTOMS: Do you have any other symptoms? (e.g., neck pain, swelling, rash, numbness, fever)     no  Protocols used: Hand and Wrist Pain-A-AH

## 2024-02-07 LAB — COMPREHENSIVE METABOLIC PANEL WITH GFR
AG Ratio: 1.7 (calc) (ref 1.0–2.5)
ALT: 18 U/L (ref 6–29)
AST: 22 U/L (ref 10–35)
Albumin: 4.6 g/dL (ref 3.6–5.1)
Alkaline phosphatase (APISO): 59 U/L (ref 37–153)
BUN: 20 mg/dL (ref 7–25)
CO2: 26 mmol/L (ref 20–32)
Calcium: 9.9 mg/dL (ref 8.6–10.4)
Chloride: 103 mmol/L (ref 98–110)
Creat: 0.7 mg/dL (ref 0.60–1.00)
Globulin: 2.7 g/dL (ref 1.9–3.7)
Glucose, Bld: 94 mg/dL (ref 65–99)
Potassium: 4.2 mmol/L (ref 3.5–5.3)
Sodium: 139 mmol/L (ref 135–146)
Total Bilirubin: 0.4 mg/dL (ref 0.2–1.2)
Total Protein: 7.3 g/dL (ref 6.1–8.1)
eGFR: 92 mL/min/1.73m2 (ref >=60)

## 2024-02-07 LAB — CBC
HCT: 38.4 % (ref 35.0–45.0)
Hemoglobin: 13 g/dL (ref 11.7–15.5)
MCH: 29.6 pg (ref 27.0–33.0)
MCHC: 33.9 g/dL (ref 32.0–36.0)
MCV: 87.5 fL (ref 80.0–100.0)
MPV: 10.1 fL (ref 7.5–12.5)
Platelets: 318 Thousand/uL (ref 140–400)
RBC: 4.39 Million/uL (ref 3.80–5.10)
RDW: 12.5 % (ref 11.0–15.0)
WBC: 6.5 Thousand/uL (ref 3.8–10.8)

## 2024-02-07 LAB — C-REACTIVE PROTEIN: CRP: <3 mg/L (ref <8.0)

## 2024-02-07 LAB — RHEUMATOID FACTOR: Rheumatoid fact SerPl-aCnc: <10 [IU]/mL (ref <14)

## 2024-02-07 LAB — SEDIMENTATION RATE: Sed Rate: 9 mm/h (ref 0–30)

## 2024-02-09 ENCOUNTER — Ambulatory Visit: Payer: Self-pay | Admitting: Family Medicine

## 2024-02-09 NOTE — Progress Notes (Signed)
 Her blood work is all normal.  We will contact her once we get results back on her x-ray.

## 2024-02-12 ENCOUNTER — Telehealth: Payer: Self-pay

## 2024-02-12 NOTE — Telephone Encounter (Signed)
 Labs printed and mailed.

## 2024-02-12 NOTE — Progress Notes (Signed)
 Her xray shows erosive arthritis.  She can continue with current treatment plan for now with having her see physical therapy however we can refer her over to see hand specialist as well.  Please place referral if she is agreeable.

## 2024-02-12 NOTE — Telephone Encounter (Signed)
 Copied from CRM [id number]. Topic: Clinical - Lab/Test Results >> Feb 12, 2024  1:40 PM Nessti S wrote: Reason for CRM: pt wants to receive lab results via mailing address. Call back number [phone]

## 2024-02-22 ENCOUNTER — Other Ambulatory Visit: Payer: Self-pay | Admitting: Family Medicine

## 2024-02-22 DIAGNOSIS — E782 Mixed hyperlipidemia: Secondary | ICD-10-CM

## 2024-03-09 ENCOUNTER — Encounter: Payer: Self-pay | Admitting: Family Medicine

## 2024-03-09 ENCOUNTER — Ambulatory Visit: Admitting: Family Medicine

## 2024-03-09 ENCOUNTER — Telehealth: Payer: Self-pay | Admitting: Family Medicine

## 2024-03-09 VITALS — BP 124/80 | HR 74 | Temp 97.7°F | Ht 61.0 in | Wt 125.5 lb

## 2024-03-09 DIAGNOSIS — R269 Unspecified abnormalities of gait and mobility: Secondary | ICD-10-CM

## 2024-03-09 DIAGNOSIS — E1129 Type 2 diabetes mellitus with other diabetic kidney complication: Secondary | ICD-10-CM | POA: Insufficient documentation

## 2024-03-09 DIAGNOSIS — E119 Type 2 diabetes mellitus without complications: Secondary | ICD-10-CM | POA: Diagnosis not present

## 2024-03-09 DIAGNOSIS — I10 Essential (primary) hypertension: Secondary | ICD-10-CM

## 2024-03-09 DIAGNOSIS — E782 Mixed hyperlipidemia: Secondary | ICD-10-CM

## 2024-03-09 DIAGNOSIS — E079 Disorder of thyroid, unspecified: Secondary | ICD-10-CM | POA: Diagnosis not present

## 2024-03-09 DIAGNOSIS — M150 Primary generalized (osteo)arthritis: Secondary | ICD-10-CM

## 2024-03-09 DIAGNOSIS — E1159 Type 2 diabetes mellitus with other circulatory complications: Secondary | ICD-10-CM

## 2024-03-09 DIAGNOSIS — M19041 Primary osteoarthritis, right hand: Secondary | ICD-10-CM | POA: Insufficient documentation

## 2024-03-09 LAB — LIPID PANEL
Cholesterol: 177 mg/dL (ref 0–200)
HDL: 47.2 mg/dL (ref >39.00)
LDL Cholesterol: 68 mg/dL (ref 0–99)
NonHDL: 129.79
Total CHOL/HDL Ratio: 4
Triglycerides: 310 mg/dL — ABNORMAL HIGH (ref 0.0–149.0)
VLDL: 62 mg/dL — ABNORMAL HIGH (ref 0.0–40.0)

## 2024-03-09 LAB — TSH: TSH: 0.23 u[IU]/mL — ABNORMAL LOW (ref 0.35–5.50)

## 2024-03-09 LAB — T4, FREE: Free T4: 1.23 ng/dL (ref 0.60–1.60)

## 2024-03-09 LAB — HEMOGLOBIN A1C: Hgb A1c MFr Bld: 6.5 % (ref 4.6–6.5)

## 2024-03-09 LAB — VITAMIN D 25 HYDROXY (VIT D DEFICIENCY, FRACTURES): VITD: 37.91 ng/mL (ref 30.00–100.00)

## 2024-03-09 LAB — T3, FREE: T3, Free: 3.2 pg/mL (ref 2.3–4.2)

## 2024-03-09 MED ORDER — BENAZEPRIL HCL 10 MG PO TABS: 10.0000 mg | ORAL_TABLET | Freq: Every day | ORAL | 1 refills | Status: DC

## 2024-03-09 MED ORDER — LEVOTHYROXINE SODIUM 100 MCG PO TABS: 100.0000 ug | ORAL_TABLET | Freq: Every day | ORAL | 1 refills | Status: AC

## 2024-03-09 MED ORDER — BENAZEPRIL HCL 10 MG PO TABS: 10.0000 mg | ORAL_TABLET | Freq: Every day | ORAL | 1 refills | Status: AC

## 2024-03-09 MED ORDER — LEVOTHYROXINE SODIUM 100 MCG PO TABS: 100.0000 ug | ORAL_TABLET | Freq: Every day | ORAL | 1 refills | Status: DC

## 2024-03-09 NOTE — Progress Notes (Unsigned)
 Subjective:     Patient ID: Diana Gilmore, female    DOB: Dec 21, 1950, 73 y.o.   MRN: [id number]  Chief Complaint  Patient presents with   Diabetes   Hypertension    Pt is here to go over Chronic Issues    Discussed the use of AI scribe software for clinical note transcription with the patient, who gave verbal consent to proceed.  History of Present Illness Diana Gilmore is a 73 year old female with diabetes, hypertension, hyperlipidemia, hypothyroidism, and arthritis who presents for a follow-up visit.  She reports not taking Farxiga  due to cost concerns. She is leaking protein in her urine, which is a complication of diabetes.  For hypertension, she is taking Benazepril  10 mg daily, with home blood pressure readings ranging from 135/75 to 140/79 mmHg. No headaches, dizziness, chest pain, shortness of breath, or cough are present.  She continues to take pravastatin  for hyperlipidemia without issues.  She takes 100 mcg of thyroid  medication daily, which was adjusted six months ago. She needs a prescription refill as the pharmacy has not yet received it.  Regarding arthritis, she experiences pain and swelling in her PIP joints, particularly the right pinky. She uses Advil as needed and finds some relief with Epsom salt soaks and ice packs. She previously used meloxicam  but discontinued it due to concerns about kidney side effects. She prefers [employer] over Voltaren gel for topical relief. Her mother had similar joint issues.  She mentions a recent trip to [place] where increased activity exacerbated her joint pain. She also notes a history of bunions, which she attributes to genetics and possibly past footwear choices.  She has made dietary changes, reducing salt intake and eating earlier in the evening to improve digestion.    Health Maintenance Due  Topic Date Due   Medicare Annual Wellness (AWV)  Never done   Mammogram  Never done   Colonoscopy  Never done   Bone Density Scan  Never done    OPHTHALMOLOGY EXAM  03/05/2023   FOOT EXAM  04/24/2023   HEMOGLOBIN A1C  02/26/2024    Past Medical History:  Diagnosis Date   Hyperlipidemia    Hypertension    Thyroid  disease     Past Surgical History:  Procedure Laterality Date   THYROID  SURGERY       Current Outpatient Medications:    albuterol  (VENTOLIN  HFA) 108 (90 Base) MCG/ACT inhaler, Inhale 2 puffs into the lungs every 4 (four) hours as needed for wheezing or shortness of breath (cough, shortness of breath or wheezing.)., Disp: 1 each, Rfl: 1   benazepril  (LOTENSIN ) 10 MG tablet, Take 1 tablet (10 mg total) by mouth daily., Disp: 90 tablet, Rfl: 1   levothyroxine  (SYNTHROID ) 100 MCG tablet, Take 1 tablet by mouth once daily, Disp: 90 tablet, Rfl: 1   Multiple Vitamins-Minerals (CENTRUM SILVER 50+WOMEN) TABS, Take 1 tablet by mouth., Disp: , Rfl:    naproxen  (NAPROSYN ) 500 MG tablet, Take 1 tablet (500 mg total) by mouth 2 (two) times daily as needed., Disp: 60 tablet, Rfl: 1   pravastatin  (PRAVACHOL ) 20 MG tablet, TAKE 1 TABLET BY MOUTH EVERY DAY, Disp: 90 tablet, Rfl: 1  Allergies  Allergen Reactions   Prednisone  Other (See Comments)    dizziness   ROS neg/noncontributory except as noted HPI/below      Objective:     BP 124/80 (BP Location: Left Arm, Patient Position: Sitting, Cuff Size: Normal)   Pulse 74   Temp 97.7 F (  36.5 C) (Temporal)   Ht 5' 1 (1.549 m)   Wt 125 lb 8 oz (56.9 kg)   BMI 23.71 kg/m  Wt Readings from Last 3 Encounters:  03/09/24 125 lb 8 oz (56.9 kg)  02/06/24 128 lb 3.2 oz (58.2 kg)  09/02/23 124 lb 8 oz (56.5 kg)    Physical Exam GENERAL: Well developed, well nourished, no acute distress. HEAD EYES EARS NOSE THROAT: Normocephalic, atraumatic, conjunctiva not injected, sclera nonicteric. CARDIAC: Regular rate and rhythm, S1 S2 present, no murmur, dorsalis pedis 2 plus bilaterally. NECK: Supple, no thyromegaly, no nodes, no carotid bruits. LUNGS: Clear to auscultation  bilaterally, no wheezes. ABDOMEN: Bowel sounds present, soft, non-tender, non-distended, no hepatosplenomegaly, no masses, abdomen normal. EXTREMITIES: No edema. MUSCULOSKELETAL: No gross abnormalities. NEUROLOGICAL: Alert and oriented x3, cranial nerves II through XII intact, sensation intact. PSYCHIATRIC: Normal mood, good eye contact.       Diabetic Foot Exam - Simple   Simple Foot Form Diabetic Foot exam was performed with the following findings: Yes 03/09/2024  2:45 PM  Visual Inspection No deformities, no ulcerations, no other skin breakdown bilaterally: Yes Sensation Testing Intact to touch and monofilament testing bilaterally: Yes Pulse Check Posterior Tibialis and Dorsalis pulse intact bilaterally: Yes Comments     Assessment & Plan:  Type 2 diabetes mellitus without complication, without long-term current use of insulin (HCC) -     Hemoglobin A1c  Primary hypertension  Mixed hyperlipidemia -     Lipid panel  Microalbuminuria due to type 2 diabetes mellitus (HCC)  Thyroid  disease -     T3, free -     T4, free -     TSH -     VITAMIN D  25 Hydroxy (Vit-D Deficiency, Fractures)  Primary osteoarthritis involving multiple joints  Primary osteoarthritis of both hands  Gait abnormality -     Ambulatory referral to Physical Therapy    Assessment and Plan Assessment & Plan 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 Farxiga  was recommended but not taken due to cost concerns. Discussed cost concerns and potential alternatives for Farxiga .  Primary hypertension   Blood pressure readings at home range from 131-140/75-79 mmHg without symptoms of headache, dizziness, chest pain, shortness of breath, or cough. She is currently on Benazepril  10 mg daily. Continue Benazepril  10 mg daily and monitor blood pressure at home.  Mixed hyperlipidemia   Her condition is managed with pravastatin  without  any reported issues. Continue pravastatin  as prescribed.  Thyroid  disease   She is managed with levothyroxine  100 mcg daily and requires a prescription refill. Refilled levothyroxine  100 mcg prescription.  Primary osteoarthritis involving multiple joints   Osteoarthritis significantly affects the right pinky PIP joint, causing tenderness and swelling. Previous x-rays and blood tests were done by another provider. She prefers non-surgical management and uses Epsom salt and ice packs for symptom relief. Discussed potential referral to a hand surgeon for further evaluation and possible interventions such as injections or surgery if symptoms worsen. Continue using Epsom salt and ice packs for symptom relief. Will consider referral to a hand surgeon if symptoms worsen.  Gait abnormality   She reports balance issues and difficulty walking, especially on uneven surfaces, and is interested in therapy to improve balance. Referred to physical therapy for balance improvement.  General health maintenance   Discussed recent eye exam and mammogram. She declined mammogram and colonoscopy. Discussed annual physical and advanced directives as per requirements.  Obtained records from recent eye exam and discussed advanced directives with the nurse.     No follow-ups on file.  Jenkins CHRISTELLA Carrel, MD

## 2024-03-09 NOTE — Telephone Encounter (Signed)
 Patient is requesting that lab results from 03/09/24 be mailed to her since she can't access mychart at home.

## 2024-03-09 NOTE — Patient Instructions (Signed)
 It was very nice to see you today!  Call with name of hand surgeon   PLEASE NOTE:  If you had any lab tests please let us  know if you have not heard back within a few days. You may see your results on MyChart before we have a chance to review them but we will give you a call once they are reviewed by us . If we ordered any referrals today, please let us  know if you have not heard from their office within the next week.   Please try these tips to maintain a healthy lifestyle:  Eat most of your calories during the day when you are active. Eliminate processed foods including packaged sweets (pies, cakes, cookies), reduce intake of potatoes, white bread, white pasta, and white rice. Look for whole grain options, oat flour or almond flour.  Each meal should contain half fruits/vegetables, one quarter protein, and one quarter carbs (no bigger than a computer mouse).  Cut down on sweet beverages. This includes juice, soda, and sweet tea. Also watch fruit intake, though this is a healthier sweet option, it still contains natural sugar! Limit to 3 servings daily.  Drink at least 1 glass of water with each meal and aim for at least 8 glasses per day  Exercise at least 150 minutes every week.

## 2024-03-10 ENCOUNTER — Ambulatory Visit: Payer: Self-pay | Admitting: Family Medicine

## 2024-03-10 DIAGNOSIS — E782 Mixed hyperlipidemia: Secondary | ICD-10-CM

## 2024-03-10 DIAGNOSIS — E079 Disorder of thyroid, unspecified: Secondary | ICD-10-CM

## 2024-03-10 MED ORDER — PRAVASTATIN SODIUM 40 MG PO TABS: 40.0000 mg | ORAL_TABLET | Freq: Every day | ORAL | 1 refills | Status: AC

## 2024-03-10 NOTE — Progress Notes (Signed)
 Sugars good Thyroid  dose too high-take 1/2 of the levothyroxine  daily.  Repeat tsh 2 months Triglycerides too high-keep working on diet.  Increase pravastatin  to 40mg  daily and do labs fasting in 2 months(orders in and bigger dose pravastatin  sent to [facility]-she can take 2 of the 20's to use them up)

## 2024-03-17 ENCOUNTER — Telehealth: Payer: Self-pay

## 2024-03-17 NOTE — Telephone Encounter (Signed)
 Copied from CRM [id number]. Topic: Referral - Question >> Mar 17, 2024  8:58 AM Burnard DEL wrote: Reason for CRM: Patient called in stating that she has been referred to PT for her balance. She stated that she now has issues with her R arm and shoulder. She stated that PT told her that she would have to have another order sent to them for them to help her with arm and shoulder.

## 2024-03-18 DIAGNOSIS — M25511 Pain in right shoulder: Secondary | ICD-10-CM

## 2024-03-18 DIAGNOSIS — M79601 Pain in right arm: Secondary | ICD-10-CM

## 2024-04-06 ENCOUNTER — Other Ambulatory Visit: Payer: Self-pay

## 2024-04-06 ENCOUNTER — Ambulatory Visit: Attending: Family Medicine

## 2024-04-06 DIAGNOSIS — R293 Abnormal posture: Secondary | ICD-10-CM | POA: Insufficient documentation

## 2024-04-06 DIAGNOSIS — Z9181 History of falling: Secondary | ICD-10-CM | POA: Insufficient documentation

## 2024-04-06 DIAGNOSIS — M6281 Muscle weakness (generalized): Secondary | ICD-10-CM | POA: Diagnosis present

## 2024-04-06 DIAGNOSIS — R252 Cramp and spasm: Secondary | ICD-10-CM | POA: Diagnosis present

## 2024-04-06 DIAGNOSIS — M79601 Pain in right arm: Secondary | ICD-10-CM | POA: Diagnosis present

## 2024-04-06 DIAGNOSIS — M25611 Stiffness of right shoulder, not elsewhere classified: Secondary | ICD-10-CM | POA: Diagnosis present

## 2024-04-06 DIAGNOSIS — R269 Unspecified abnormalities of gait and mobility: Secondary | ICD-10-CM | POA: Diagnosis not present

## 2024-04-06 DIAGNOSIS — M25511 Pain in right shoulder: Secondary | ICD-10-CM | POA: Diagnosis present

## 2024-04-06 NOTE — Progress Notes (Addendum)
 " OUTPATIENT PHYSICAL THERAPY UPPER EXTREMITY EVALUATION PHYSICAL THERAPY DISCHARGE SUMMARY  Visits from Start of Care: 1  Current functional level related to goals / functional outcomes: See below: patient cancelled appts a few days after initial eval   Remaining deficits: See below   Education / Equipment: See below   Patient agrees to discharge. Patient goals were not met. Patient is being discharged due to not returning since the last visit.    Patient Name: Diana Gilmore MRN: [id number] DOB:Sep 10, 1950, 74 y.o., female Today's Date: 04/06/2024  END OF SESSION:  PT End of Session - 04/06/24 1024     Visit Number 1    Date for Recertification  06/01/24    Authorization Type [employer]    Authorization Time Period No auth required- medical necessity    Progress Note Due on Visit 10    PT Start Time 1018    PT Stop Time 1108    PT Time Calculation (min) 50 min    Activity Tolerance Patient tolerated treatment well    Behavior During Therapy WFL for tasks assessed/performed          Past Medical History:  Diagnosis Date   Hyperlipidemia    Hypertension    Thyroid  disease    Past Surgical History:  Procedure Laterality Date   THYROID  SURGERY     Patient Active Problem List   Diagnosis Date Noted   Primary osteoarthritis of both hands 03/09/2024   Microalbuminuria due to type 2 diabetes mellitus (HCC) 03/09/2024   Primary osteoarthritis involving multiple joints 09/10/2021   Prediabetes 09/10/2021   Metabolic syndrome X 12/07/2016   Thyroid  disease 08/31/2016   Hypertension 08/31/2016   Central retinal vein occlusion of right eye (HCC) 06/18/2016   Nuclear sclerotic cataract of both eyes 06/18/2016   Panuveitis, 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   Retinal edema 06/18/2016   Hyperlipemia 12/27/2015   Type 2 diabetes mellitus without complication, without long-term current use of insulin (HCC) 12/27/2015    PCP: Wendolyn Jenkins Jansky, MD   REFERRING PROVIDER: Wendolyn Jenkins Jansky,  MD  REFERRING DIAG: 79.601 (ICD-10-CM) - Pain of right upper extremity M25.511 (ICD-10-CM) - Acute pain of right shoulder R26.9 (ICD-10-CM) - Unspecified abnormalities of gait and mobility  THERAPY DIAG:  79.601 (ICD-10-CM) - Pain of right upper extremity M25.511 (ICD-10-CM) - Acute pain of right shoulder R26.9 (ICD-10-CM) - Unspecified abnormalities of gait and mobility   Rationale for Evaluation and Treatment: Rehabilitation  ONSET DATE: 03/18/2024  SUBJECTIVE:  79.601 (ICD-10-CM) - Pain of right upper extremity M25.511 (ICD-10-CM) - Acute pain of right shoulder R26.9 (ICD-10-CM) - Unspecified abnormalities of gait and mobility SUBJECTIVE STATEMENT: Patient states she has been in [employer] business for 35 years.  She started out in [place] then moved here.  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  She may occasionally go in to help if needed but has pretty much stopped working all together. She feels she is also unsteady when she is walking due to bilateral knee pain.  She notices this mostly when descending stairs.  She hopes to get her shoulder well so that she can use it without pain and to avoid falling.    Hand dominance: Right  PERTINENT HISTORY: [place] on front steps during snow/ice storm.   PAIN:  Are you having pain? Yes: NPRS scale: 5/10 Pain location: right shoulder, bilateral knees, fingers due to severe OA Pain description: aching Aggravating factors: steps, bending , stooping and squatting Relieving factors: icy hot, meloxicam , epsom salt  PRECAUTIONS: None  RED FLAGS: None   WEIGHT BEARING RESTRICTIONS: No  FALLS:  Has patient fallen in last 6 months? No  LIVING ENVIRONMENT: Lives with: lives with their spouse Lives in:  House/apartment  OCCUPATION: Owns restaurant  PLOF: Independent, Independent with basic ADLs, Independent with household mobility without device, Independent with community mobility without device, Independent with homemaking with ambulation, Independent with gait, and Independent with transfers  PATIENT GOALS: To be able to use my right arm for routine daily activities without pain  NEXT MD VISIT: prn  OBJECTIVE:  Note: Objective measures were completed at Evaluation unless otherwise noted.  DIAGNOSTIC FINDINGS:  Xrays of both knees show varus deformity bilaterally along with quite pronounced tibial tubercle.  These were in 2022 and medial compartment on both LE's still fairly preserved at that time.    PATIENT SURVEYS :  Quick Dash:  QUICK DASH  Please rate your ability do the following activities in the last week by selecting the number below the appropriate response.   Activities Rating   (A QuickDASH score may not be calculated if there is greater than 1 missing item.)  Quick Dash Disability/Symptom Score: [(sum of 70.5 (n) responses/100 (n)] x 25 = 70.5%  Minimally Clinically Important Difference (MCID): 15-20 points  (Franchignoni, F. et al. (2013). Minimally clinically important difference of the disabilities of the arm, shoulder, and hand outcome measures (DASH) and its shortened version (Quick DASH). Journal of Orthopaedic & Sports Physical Therapy, 44(1), 30-39)   COGNITION: Overall cognitive status: Within functional limits for tasks assessed     SENSATION: WFL  POSTURE: Bilateral knee varus  UPPER EXTREMITY ROM:   Active ROM Right eval Left eval  Shoulder flexion WFL with pain   Shoulder extension    Shoulder abduction WFL with pain   Shoulder adduction    Shoulder internal rotation To lateral hip   Shoulder external rotation To top of head   Elbow flexion    Elbow extension    Wrist flexion    Wrist extension    Wrist ulnar deviation    Wrist  radial deviation    Wrist pronation    Wrist supination    (Blank rows = not tested)  UPPER EXTREMITY MMT:  MMT Right eval Left eval  Shoulder flexion 3/5 but very little effort due to pain   Shoulder extension    Shoulder abduction 3/5 but very  little effort due to pain   Shoulder adduction    Shoulder internal rotation 3/5 but very little effort due to pain   Shoulder external rotation 3/5 but very little effort due to pain   Middle trapezius    Lower trapezius    Elbow flexion    Elbow extension    Wrist flexion    Wrist extension    Wrist ulnar deviation    Wrist radial deviation    Wrist pronation    Wrist supination    Grip strength (lbs)    (Blank rows = not tested)  SHOULDER SPECIAL TESTS: Impingement tests: Hawkins/Kennedy impingement test: positive  and Painful arc test: positive  SLAP lesions: Biceps load test: negative Instability tests: Apprehension test: negative Rotator cuff assessment: Drop arm test: positive  and Empty can test: positive  Biceps assessment: Speed's test: negative  JOINT MOBILITY TESTING:  Bilateral patella laterally track and patella alta but only minor crepitus  PALPATION:  Bilateral patella laterally track and patella alta but only minor crepitus  SLS:  10 sec on each LE with ease                                                                                                                             TREATMENT DATE: 04/06/24 Initial eval completed and initiated HEP   PATIENT EDUCATION: Education details: Initiated HEP Person educated: Patient Education method: Programmer, Multimedia, Facilities Manager, Actor cues, Verbal cues, and Handouts Education comprehension: verbalized understanding, returned demonstration, and verbal cues required  HOME EXERCISE PROGRAM: Access Code: [email or web address] URL: [email or web address] Date: 04/06/2024 Prepared by: Delon Haddock  Exercises - Shoulder extension with resistance - Neutral  - 2 x  daily - 7 x weekly - 2 sets - 10 reps - Standing Shoulder Row with Anchored Resistance  - 2 x daily - 7 x weekly - 2 sets - 10 reps - Shoulder External Rotation and Scapular Retraction with Resistance  - 2 x daily - 7 x weekly - 2 sets - 10 reps - Standing Shoulder Horizontal Abduction with Resistance  - 2 x daily - 7 x weekly - 2 sets - 10 reps - Supine Shoulder Flexion Extension AAROM with Dowel  - 1 x daily - 7 x weekly - 1 sets - 10 reps - 5 sec hold - Supine Shoulder Abduction AAROM with Dowel  - 1 x daily - 7 x weekly - 1 sets - 10 reps - 5 sec hold - Supine Shoulder External Internal Rotation AAROM with Dowel  - 1 x daily - 7 x weekly - 1 sets - 10 reps - 5 sec hold - Standing Shoulder Internal Rotation Stretch with Towel  - 1 x daily - 7 x weekly - 1 sets - 10 reps - 5 sec hold  ASSESSMENT:  CLINICAL IMPRESSION: Patient is a 74 y.o. female who was seen today for physical therapy evaluation and treatment for right shoulder pain and fall prevention. She presents with decreased  ROM, strength and function of the right UE, bilateral knee varus with mild crepitus, abnormal gait, and bilateral quad and hip weakness, along with elevated pain in both areas.  She demonstrates trendelenburg gait and short step length.  She was able to demonstrate SLS with ease x 10 sec on each LE.    She should respond well to shoulder, quad and hip strengthening and scapular stabilization along with modalities for pain relief, gait training and balance training.     OBJECTIVE IMPAIRMENTS: decreased ROM, decreased strength, increased fascial restrictions, increased muscle spasms, impaired flexibility, impaired UE functional use, postural dysfunction, and pain.   ACTIVITY LIMITATIONS: carrying, lifting, sleeping, transfers, bed mobility, bathing, toileting, dressing, and reach over head  PARTICIPATION LIMITATIONS: meal prep, cleaning, laundry, driving, shopping, community activity, and yard work  PERSONAL FACTORS:  Fitness and 1-2 comorbidities: htn, OA are also affecting patient's functional outcome.   REHAB POTENTIAL: Good  CLINICAL DECISION MAKING: Stable/uncomplicated  EVALUATION COMPLEXITY: Low  GOALS: Goals reviewed with patient? Yes  SHORT TERM GOALS: Target date: 05/04/2024  Pain report to be no greater than 4/10  Baseline: Goal status: INITIAL  2.  Patient will be independent with initial HEP  Baseline:  Goal status: INITIAL  3.  Patient to be able to sleep through the night  Baseline:  Goal status: INITIAL  4.  ROM to improve by 5-10 degrees in each plane of motion Baseline:  Goal status: INITIAL    LONG TERM GOALS: Target date: 06/01/2024  Patient to report pain no greater than 2/10  Baseline:  Goal status: INITIAL  2.  Patient to be independent with advanced HEP  Baseline:  Goal status: INITIAL  3.  Patient will be able to reach overhead into cabinets and on top of shelves without pain  Baseline:  Goal status: INITIAL  4.  Patient to be independent with ADL's and IADL's using right shoulder Baseline:  Goal status: INITIAL  5.  ROM to be WNL Baseline:  Goal status: INITIAL  6.  Strength to be 4+/5 on all MMT's of the right shoulder Baseline:  Goal status: INITIAL  PLAN: PT FREQUENCY: 1-2x/week  PT DURATION: 8 weeks  PLANNED INTERVENTIONS: 97110-Therapeutic exercises, 97530- Therapeutic activity, [id number]- Neuromuscular re-education, 97535- Self Care, 02859- Manual therapy, [phone]- Canalith repositioning, [id number]- Aquatic Therapy, H9716- Electrical stimulation (unattended), [id number]- Electrical stimulation (manual), [id number]- Vasopneumatic device, [id number]- Ultrasound, [id number]- Traction (mechanical), [id number]- Ionotophoresis 4mg /ml Dexamethasone, 79439 (1-2 muscles), 20561 (3+ muscles)- Dry Needling, Patient/Family education, Taping, Joint mobilization, Spinal mobilization, Vestibular training, Cryotherapy, and Moist heat  PLAN FOR NEXT SESSION: Review HEP, complete BERG, add  scapular stabilization and rotator cuff strengthening.  Delon B. Annaleah Arata, PT 04/27/24 9:24 AM  Delon B. Xian Apostol, PT 04/06/2024 12:31 PM [facility] [address] [place], [place] [address] Phone # [phone] Fax [phone]  "

## 2024-04-06 NOTE — Progress Notes (Deleted)
 " OUTPATIENT PHYSICAL THERAPY UPPER EXTREMITY EVALUATION   Patient Name: Diana Gilmore MRN: [id number] DOB:03-24-1951, 74 y.o., female Today's Date: 04/06/2024  END OF SESSION:   Past Medical History:  Diagnosis Date   Hyperlipidemia    Hypertension    Thyroid  disease    Past Surgical History:  Procedure Laterality Date   THYROID  SURGERY     Patient Active Problem List   Diagnosis Date Noted   Primary osteoarthritis of both hands 03/09/2024   Microalbuminuria due to type 2 diabetes mellitus (HCC) 03/09/2024   Primary osteoarthritis involving multiple joints 09/10/2021   Prediabetes 09/10/2021   Metabolic syndrome X 12/07/2016   Thyroid  disease 08/31/2016   Hypertension 08/31/2016   Central retinal vein occlusion of right eye (HCC) 06/18/2016   Nuclear sclerotic cataract of both eyes 06/18/2016   Panuveitis, 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   Retinal edema 06/18/2016   Hyperlipemia 12/27/2015   Type 2 diabetes mellitus without complication, without long-term current use of insulin (HCC) 12/27/2015    PCP: Wendolyn Jenkins Jansky, MD   REFERRING PROVIDER: Wendolyn Jenkins Jansky, MD  REFERRING DIAG: 79.601 (ICD-10-CM) - Pain of right upper extremity M25.511 (ICD-10-CM) - Acute pain of right shoulder R26.9 (ICD-10-CM) - Unspecified abnormalities of gait and mobility  THERAPY DIAG:  79.601 (ICD-10-CM) - Pain of right upper extremity M25.511 (ICD-10-CM) - Acute pain of right shoulder R26.9 (ICD-10-CM) - Unspecified abnormalities of gait and mobility   Rationale for Evaluation and Treatment: Rehabilitation  ONSET DATE: 03/18/2024  SUBJECTIVE:                                                                                                                                                                                     79.601 (ICD-10-CM) - Pain of right upper extremity M25.511 (ICD-10-CM) - Acute pain of right shoulder R26.9 (ICD-10-CM) - Unspecified abnormalities of gait and mobility SUBJECTIVE  STATEMENT: *** Hand dominance: {MISC; OT HAND [phone]}  PERTINENT HISTORY: ***  PAIN:  Are you having pain? Yes: NPRS scale: *** Pain location: *** Pain description: *** Aggravating factors: *** Relieving factors: ***  PRECAUTIONS: None  RED FLAGS: None   WEIGHT BEARING RESTRICTIONS: No  FALLS:  Has patient fallen in last 6 months? No  LIVING ENVIRONMENT: Lives with: lives with their spouse Lives in: House/apartment  OCCUPATION: ***  PLOF: Independent, Independent with basic ADLs, Independent with household mobility without device, Independent with community mobility without device, Independent with homemaking with ambulation, Independent with gait, and Independent with transfers  PATIENT GOALS: To be able to use my right arm for routine daily activities without pain  NEXT MD VISIT: prn  OBJECTIVE:  Note: Objective measures were completed at  Evaluation unless otherwise noted.  DIAGNOSTIC FINDINGS:  ***  PATIENT SURVEYS :  Quick Dash:  QUICK DASH  Please rate your ability do the following activities in the last week by selecting the number below the appropriate response.   Activities Rating  Open a tight or new jar.  {Q-dash (1-6):32984}  Do heavy household chores (e.g., wash walls, floors). {Q-dash DEVELOPMENT WORKER, COMMUNITY  Carry a shopping bag or briefcase {Q-dash [facility] your back. {Q-dash (1-6):32984}  Use a knife to cut food. {Q-dash (1-6):32984}  Recreational activities in which you take some force or impact through your arm, shoulder or hand (e.g., golf, hammering, tennis, etc.). {Q-dash (1-6):32984}  During the past week, to what extent has your arm, shoulder or hand problem interfered with your normal social activities with family, friends, neighbors or groups?  {Q-Dash 7:32985}  During the past week, were you limited in your work or other regular daily activities as a result of your arm, shoulder or hand problem? {Q-dash 8:32988}  Rate  the severity of the following symptoms in the last week: Arm, Shoulder, or hand pain. {Q-dash 0-89:67013}  Rate the severity of the following symptoms in the last week: Tingling (pins and needles) in your arm, shoulder or hand. {Q-dash 0-89:67013}  During the past week, how much difficulty have you had sleeping because of the pain in your arm, shoulder or hand?  {Q-dash 11:32987}   (A QuickDASH score may not be calculated if there is greater than 1 missing item.)  Quick Dash Disability/Symptom Score: [(sum of *** (n) responses/*** (n)] x 25 = ***  Minimally Clinically Important Difference (MCID): 15-20 points  (Franchignoni, F. et al. (2013). Minimally clinically important difference of the disabilities of the arm, shoulder, and hand outcome measures (DASH) and its shortened version (Quick DASH). Journal of Orthopaedic & Sports Physical Therapy, 44(1), 30-39)   COGNITION: Overall cognitive status: Within functional limits for tasks assessed     SENSATION: WFL  POSTURE: ***  UPPER EXTREMITY ROM:   Active ROM Right eval Left eval  Shoulder flexion    Shoulder extension    Shoulder abduction    Shoulder adduction    Shoulder internal rotation    Shoulder external rotation    Elbow flexion    Elbow extension    Wrist flexion    Wrist extension    Wrist ulnar deviation    Wrist radial deviation    Wrist pronation    Wrist supination    (Blank rows = not tested)  UPPER EXTREMITY MMT:  MMT Right eval Left eval  Shoulder flexion    Shoulder extension    Shoulder abduction    Shoulder adduction    Shoulder internal rotation    Shoulder external rotation    Middle trapezius    Lower trapezius    Elbow flexion    Elbow extension    Wrist flexion    Wrist extension    Wrist ulnar deviation    Wrist radial deviation    Wrist pronation    Wrist supination    Grip strength (lbs)    (Blank rows = not tested)  SHOULDER SPECIAL TESTS: Impingement tests:  Hawkins/Kennedy impingement test: {pos/neg:25230} and Painful arc test: {pos/neg:25230} SLAP lesions: Biceps load test: {pos/neg:25230} Instability tests: Apprehension test: {pos/neg:25230} Rotator cuff assessment: Drop arm test: {pos/neg:25230} and Empty can test: {pos/neg:25230} Biceps assessment: Speed's test: {pos/neg:25230}  JOINT MOBILITY TESTING:  ***  PALPATION:  ***  TREATMENT DATE: 04/06/24 Initial eval completed and initiated HEP   PATIENT EDUCATION: Education details: Initiated HEP Person educated: Patient Education method: Explanation, Demonstration, Actor cues, Verbal cues, and Handouts Education comprehension: verbalized understanding, returned demonstration, and verbal cues required  HOME EXERCISE PROGRAM: Access Code: [email or web address] URL: [email or web address] Date: 04/06/2024 Prepared by: Delon Haddock  Exercises - Shoulder extension with resistance - Neutral  - 2 x daily - 7 x weekly - 2 sets - 10 reps - Standing Shoulder Row with Anchored Resistance  - 2 x daily - 7 x weekly - 2 sets - 10 reps - Shoulder External Rotation and Scapular Retraction with Resistance  - 2 x daily - 7 x weekly - 2 sets - 10 reps - Standing Shoulder Horizontal Abduction with Resistance  - 2 x daily - 7 x weekly - 2 sets - 10 reps - Supine Shoulder Flexion Extension AAROM with Dowel  - 1 x daily - 7 x weekly - 1 sets - 10 reps - 5 sec hold - Supine Shoulder Abduction AAROM with Dowel  - 1 x daily - 7 x weekly - 1 sets - 10 reps - 5 sec hold - Supine Shoulder External Internal Rotation AAROM with Dowel  - 1 x daily - 7 x weekly - 1 sets - 10 reps - 5 sec hold - Standing Shoulder Internal Rotation Stretch with Towel  - 1 x daily - 7 x weekly - 1 sets - 10 reps - 5 sec hold  ASSESSMENT:  CLINICAL IMPRESSION: Patient is a 74 y.o. female who was seen today  for physical therapy evaluation and treatment for right shoulder pain. She presents with decreased ROM, strength and function of the right UE along with elevated pain.  She should respond well to shoulder strengthening and scapular stabilization along with modalities for pain relief.     OBJECTIVE IMPAIRMENTS: decreased ROM, decreased strength, increased fascial restrictions, increased muscle spasms, impaired flexibility, impaired UE functional use, postural dysfunction, and pain.   ACTIVITY LIMITATIONS: carrying, lifting, sleeping, transfers, bed mobility, bathing, toileting, dressing, and reach over head  PARTICIPATION LIMITATIONS: meal prep, cleaning, laundry, driving, shopping, community activity, and yard work  PERSONAL FACTORS: Fitness and 1-2 comorbidities: htn, OA are also affecting patient's functional outcome.   REHAB POTENTIAL: Good  CLINICAL DECISION MAKING: Stable/uncomplicated  EVALUATION COMPLEXITY: Low  GOALS: Goals reviewed with patient? Yes  SHORT TERM GOALS: Target date: 05/04/2024  Pain report to be no greater than 4/10  Baseline: Goal status: INITIAL  2.  Patient will be independent with initial HEP  Baseline:  Goal status: INITIAL  3.  Patient to be able to sleep through the night  Baseline:  Goal status: INITIAL  4.  ROM to improve by 5-10 degrees in each plane of motion Baseline:  Goal status: INITIAL    LONG TERM GOALS: Target date: 06/01/2024  Patient to report pain no greater than 2/10  Baseline:  Goal status: INITIAL  2.  Patient to be independent with advanced HEP  Baseline:  Goal status: INITIAL  3.  Patient will be able to reach overhead into cabinets and on top of shelves without pain  Baseline:  Goal status: INITIAL  4.  Patient to be independent with ADL's and IADL's using right shoulder Baseline:  Goal status: INITIAL  5.  ROM to be WNL Baseline:  Goal status: INITIAL  6.  Strength to be 4+/5 on all MMT's of the right  shoulder Baseline:  Goal status: INITIAL  PLAN: PT  FREQUENCY: 1-2x/week  PT DURATION: 8 weeks  PLANNED INTERVENTIONS: 97110-Therapeutic exercises, 97530- Therapeutic activity, [id number]- Neuromuscular re-education, 97535- Self Care, 02859- Manual therapy, [phone]- Canalith repositioning, [id number]- Aquatic Therapy, H9716- Electrical stimulation (unattended), [phone]- Electrical stimulation (manual), [id number]- Vasopneumatic device, [id number]- Ultrasound, [id number]- Traction (mechanical), [id number]- Ionotophoresis 4mg /ml Dexamethasone, 79439 (1-2 muscles), 20561 (3+ muscles)- Dry Needling, Patient/Family education, Taping, Joint mobilization, Spinal mobilization, Vestibular training, Cryotherapy, and Moist heat  PLAN FOR NEXT SESSION: Review HEP, add scapular stabilization and rotator cuff strengthening.   Delon KATHEE Haddock, PT 04/06/2024, 9:18 AM  "

## 2024-04-13 ENCOUNTER — Ambulatory Visit: Payer: Self-pay | Admitting: *Deleted

## 2024-04-13 NOTE — Telephone Encounter (Signed)
" °  FYI Only or Action Required?: FYI only for provider: appointment scheduled on 1/13.  Patient was last seen in primary care on 03/09/2024 by Wendolyn Jenkins Jansky, MD.  Called Nurse Triage reporting Arm Pain.  Symptoms began about a month ago.  Interventions attempted: Other: PT.  Symptoms are: unchanged.  Triage Disposition: See PCP When Office is Open (Within 3 Days)  Patient/caregiver understands and will follow disposition?: Yes   Copied from CRM #[id number]. Topic: Clinical - Red Word Triage >> Apr 13, 2024  8:20 AM Pinkey ORN wrote: Red Word that prompted transfer to Nurse Triage: Pain In Right Arm >> Apr 13, 2024  8:21 AM Pinkey ORN wrote: Patient states the pain is bad to the point she can't raise her arm. Reason for Disposition  [1] MODERATE pain (e.g., interferes with normal activities) AND [2] present > 3 days  Answer Assessment - Initial Assessment Questions 1. ONSET: When did the pain start?     2 years ago- 1 month worse 2. LOCATION: Where is the pain located?     Right arm shoulder/elbow- joint, also fingers- patient has been going to PT- she states this is not really helping her pain. 3. PAIN: How bad is the pain? (Scale 0-10; or none, mild, moderate, severe)     5-6/10 4. WORK OR EXERCISE: Has there been any recent work or exercise that involved this part of the body?     No- patient has been doing PT for the pain- not helping 5. CAUSE: What do you think is causing the arm pain?     arthritis 6. OTHER SYMPTOMS: Do you have any other symptoms? (e.g., neck pain, swelling, rash, fever, numbness, weakness)     Hard to raise arm up, painful at night while sleeping  Protocols used: Arm Pain-A-AH  "

## 2024-04-14 ENCOUNTER — Ambulatory Visit

## 2024-04-16 ENCOUNTER — Ambulatory Visit: Admitting: Family Medicine

## 2024-04-16 ENCOUNTER — Encounter: Payer: Self-pay | Admitting: Family Medicine

## 2024-04-16 VITALS — BP 128/72 | HR 76 | Temp 97.2°F | Ht 61.0 in | Wt 132.2 lb

## 2024-04-16 DIAGNOSIS — M25511 Pain in right shoulder: Secondary | ICD-10-CM | POA: Diagnosis not present

## 2024-04-16 DIAGNOSIS — M150 Primary generalized (osteo)arthritis: Secondary | ICD-10-CM

## 2024-04-16 DIAGNOSIS — M19042 Primary osteoarthritis, left hand: Secondary | ICD-10-CM

## 2024-04-16 DIAGNOSIS — M19041 Primary osteoarthritis, right hand: Secondary | ICD-10-CM | POA: Diagnosis not present

## 2024-04-16 MED ORDER — NAPROXEN 500 MG PO TABS: 500.0000 mg | ORAL_TABLET | Freq: Two times a day (BID) | ORAL | 1 refills | Status: AC | PRN

## 2024-04-16 NOTE — Progress Notes (Unsigned)
 "  Subjective:     Patient ID: Diana Gilmore, female    DOB: [birth year], 74 y.o.   MRN: [id number]  Chief Complaint  Patient presents with   Shoulder Pain    Pts right shoulder is hurting    Discussed the use of AI scribe software for clinical note transcription with the patient, who gave verbal consent to proceed.  History of Present Illness Jourdyn Hasler is a 74 year old female who presents with right shoulder pain and weakness.  She has been experiencing right shoulder pain and weakness for the past two weeks without a specific history of injury or fall, though she suspects it may be related to carrying something heavy. The pain is severe, particularly with upward arm movement, and is accompanied by significant weakness, making it difficult to perform tasks such as pulling a blanket at night. She estimates the weakness to be about 40-50% compared to her left arm. No numbness, tingling, or dropping of objects is noted, but the arm feels weak.  For pain management, she has been taking mini Advil, one or two pills a day, which provides relief for about 8-10 hours. She is concerned about overuse of Advil, particularly regarding its effects on her kidneys. 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 Meloxicam  made her feel strong and active, but she is cautious about its impact on her kidneys.    Health Maintenance Due  Topic Date Due   Medicare Annual Wellness (AWV)  Never done   OPHTHALMOLOGY EXAM  03/05/2023    Past Medical History:  Diagnosis Date   Hyperlipidemia    Hypertension    Thyroid  disease     Past Surgical History:  Procedure Laterality Date   THYROID  SURGERY      Current Medications[1]  Allergies[2] ROS neg/noncontributory except as noted HPI/below      Objective:     BP 128/72 (BP Location: Left Arm, Patient Position: Sitting, Cuff  Size: Normal)   Pulse 76   Temp (!) 97.2 F (36.2 C) (Temporal)   Ht 5' 1 (1.549 m)   Wt 132 lb 4 oz (60 kg)   SpO2 98%   BMI 24.99 kg/m  Wt Readings from Last 3 Encounters:  04/16/24 132 lb 4 oz (60 kg)  03/09/24 125 lb 8 oz (56.9 kg)  02/06/24 128 lb 3.2 oz (58.2 kg)    Physical Exam GENERAL: Well developed well nourished no acute distress HEAD EYES EARS NOSE THROAT: Normocephalic atraumatic, conjunctiva not injected, sclera nonicteric CARDIAC: Regular rate and rhythm, S1 S2 present , no murmur, dorsalis pedis 2 plus bilaterally NECK: Supple, no thyromegaly, no nodes, no carotid bruits LUNGS: Clear to auscultation bilaterally, no wheezes ABDOMEN: Bowel sounds present, soft, non tender non distended, no hepatosplenomegaly, no masses EXTREMITIES: No edema MUSCULOSKELETAL: No gross abnormalities NEUROLOGICAL: Alert and oriented x3, cranial nerves II through XII intact PSYCHIATRIC: Normal mood, good eye contact       Assessment & Plan:  Acute pain of right shoulder -     Ambulatory referral to Orthopedic Surgery  Primary osteoarthritis of both hands -     Ambulatory referral to Orthopedic Surgery  Primary osteoarthritis involving multiple joints -     Ambulatory referral to Orthopedic Surgery  Other orders -     Naproxen ; Take 1 tablet (500 mg total) by mouth 2 (two) times daily as needed.  Dispense: 60 tablet; Refill: 1    Assessment and Plan Assessment & Plan Acute right shoulder pain   She has experienced acute right shoulder pain for two weeks, worsened by movement and weakness, especially at night. There is no history of injury or fall. Pain is currently managed with Advil, but there are concerns about overuse. Differential diagnosis includes arthritis and possible nerve involvement. She prefers to consult Dr. Camella before considering physical therapy. Prescribe naproxen  500 mg once daily, with the option to increase to twice daily if needed. Advise against taking  Advil while on naproxen  and recommend Tylenol as needed for additional pain relief. Refer to Dr. Camella for further evaluation of shoulder and hand issues. Discuss potential chiropractic care for shoulder pain, noting mixed evidence for efficacy and insurance coverage issues. Consider a shoulder injection if weakness persists after consultation with Dr. Camella.  Primary osteoarthritis of the hands   She has chronic osteoarthritis of the hands, previously managed with meloxicam , which was discontinued due to dizziness. She is hesitant about surgery and interested in non-surgical options. Refer to Dr. Camella for further evaluation and management options, including potential injections. Discuss the potential for steroid injections as a non-surgical option, noting risks of increased infection but potential for significant pain relief. Advise against meloxicam  due to previous dizziness and recommend naproxen  as an alternative.     Return for as sch 6/9.  Jenkins CHRISTELLA Carrel, MD     [1]  Current Outpatient Medications:    albuterol  (VENTOLIN  HFA) 108 (90 Base) MCG/ACT inhaler, Inhale 2 puffs into the lungs every 4 (four) hours as needed for wheezing or shortness of breath (cough, shortness of breath or wheezing.)., Disp: 1 each, Rfl: 1   benazepril  (LOTENSIN ) 10 MG tablet, Take 1 tablet (10 mg total) by mouth daily., Disp: 90 tablet, Rfl: 1   levothyroxine  (SYNTHROID ) 100 MCG tablet, Take 1 tablet (100 mcg total) by mouth daily., Disp: 90 tablet, Rfl: 1   Multiple Vitamins-Minerals (CENTRUM SILVER 50+WOMEN) TABS, Take 1 tablet by mouth., Disp: , Rfl:    pravastatin  (PRAVACHOL ) 40 MG tablet, Take 1 tablet (40 mg total) by mouth daily., Disp: 90 tablet, Rfl: 1   naproxen  (NAPROSYN ) 500 MG tablet, Take 1 tablet (500 mg total) by mouth 2 (two) times daily as needed., Disp: 60 tablet, Rfl: 1 [2]  Allergies Allergen Reactions   Prednisone  Other (See Comments)    dizziness   "

## 2024-04-16 NOTE — Patient Instructions (Addendum)
 Naproxen  1/day, but can do twice/day if needed  Take tylenol in between if needed  Referral to Dr. Camella

## 2024-04-21 ENCOUNTER — Ambulatory Visit

## 2024-04-26 ENCOUNTER — Ambulatory Visit: Admitting: Physical Therapy

## 2024-04-28 ENCOUNTER — Ambulatory Visit

## 2024-05-03 ENCOUNTER — Ambulatory Visit: Admitting: Physical Therapy

## 2024-05-05 ENCOUNTER — Ambulatory Visit: Admitting: Physical Therapy

## 2024-05-10 ENCOUNTER — Ambulatory Visit: Admitting: Physical Therapy

## 2024-05-12 ENCOUNTER — Other Ambulatory Visit

## 2024-05-12 ENCOUNTER — Ambulatory Visit: Admitting: Physical Therapy

## 2024-05-17 ENCOUNTER — Ambulatory Visit: Admitting: Physical Therapy

## 2024-05-19 ENCOUNTER — Ambulatory Visit: Admitting: Physical Therapy

## 2024-05-24 ENCOUNTER — Ambulatory Visit: Admitting: Physical Therapy

## 2024-05-26 ENCOUNTER — Ambulatory Visit

## 2024-05-31 ENCOUNTER — Ambulatory Visit: Admitting: Physical Therapy

## 2024-09-07 ENCOUNTER — Encounter: Admitting: Family Medicine
